# Patient Record
Sex: Female | Born: 1995 | Race: Black or African American | Hispanic: No | Marital: Single | Smoking: Current every day smoker
Health system: Southern US, Community
[De-identification: ages and names within clinical notes are randomized; demographics above are authoritative.]

## PROBLEM LIST (undated history)

## (undated) ENCOUNTER — Inpatient Hospital Stay (HOSPITAL_COMMUNITY): Payer: Self-pay

## (undated) DIAGNOSIS — G43909 Migraine, unspecified, not intractable, without status migrainosus: Secondary | ICD-10-CM

## (undated) DIAGNOSIS — J45909 Unspecified asthma, uncomplicated: Secondary | ICD-10-CM

## (undated) DIAGNOSIS — K589 Irritable bowel syndrome without diarrhea: Secondary | ICD-10-CM

## (undated) DIAGNOSIS — Z3490 Encounter for supervision of normal pregnancy, unspecified, unspecified trimester: Secondary | ICD-10-CM

## (undated) HISTORY — PX: WISDOM TOOTH EXTRACTION: SHX21

## (undated) HISTORY — PX: MOUTH SURGERY: SHX715

---
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
## 2013-04-17 ENCOUNTER — Ambulatory Visit: Payer: Self-pay | Admitting: Physician Assistant

## 2013-04-17 VITALS — BP 112/62 | HR 81 | Temp 98.1°F | Resp 18 | Ht 64.5 in | Wt 154.0 lb

## 2013-04-17 DIAGNOSIS — Z0289 Encounter for other administrative examinations: Secondary | ICD-10-CM

## 2013-04-17 NOTE — Progress Notes (Signed)
Subjective:    Patient ID: Ronnette Hila, female    DOB: 08/25/1996, 17 y.o.   MRN: [id number]  HPI   Ms. Heffner is a pleasant 17 yr old female here requesting a sports physical.  She plays volleyball at [employer] and was told she needed a physical.  She does not bring any paperwork with her to be completed.  She does not want a complete physical today.  She reports that she has a history of exercise induced asthma, treated with albuterol only.  Uses inhaler about twice per week.  Denies night time symptoms or symptoms at rest.    Denies other medication use.  Denies other medical problems.  Denies surgical history.  Reports hx of multiple ankle sprains while playing basketball.  Does report that she had "really bad chest pain" in 2012 that required an EKG.  She reports that this was normal.  She did see a cardiologist in [place] for this.   PCP is Dr. Orson Aloe here in [place] but she states she has not seen him in over 1 yr.  She has never had an further episodes of CP.  Denies syncope or presyncope with exercise or at rest.  She is not aware of any family history of heart problems or SCD.      Review of Systems  Constitutional: Negative.   HENT: Negative.   Respiratory: Negative.   Cardiovascular: Negative.   Gastrointestinal: Negative.   Musculoskeletal: Negative.   Skin: Negative.   Neurological: Negative.        Objective:   Physical Exam  Vitals reviewed. Constitutional: She is oriented to person, place, and time. She appears well-developed and well-nourished. No distress.  HENT:  Head: Normocephalic and atraumatic.  Right Ear: Tympanic membrane and ear canal normal.  Left Ear: Tympanic membrane and ear canal normal.  Mouth/Throat: Uvula is midline, oropharynx is clear and moist and mucous membranes are normal.  Eyes: Conjunctivae and EOM are normal. Pupils are equal, round, and reactive to light. No scleral icterus.  Neck: Normal range of motion. Neck supple.   Cardiovascular: Normal rate, regular rhythm and normal heart sounds.   Pulmonary/Chest: Effort normal and breath sounds normal. She has no wheezes. She has no rales.  Abdominal: Soft. Bowel sounds are normal. There is no tenderness.  Musculoskeletal: Normal range of motion.  Lymphadenopathy:    She has no cervical adenopathy.  Neurological: She is alert and oriented to person, place, and time. She has normal reflexes.  Skin: Skin is warm and dry.  Psychiatric: She has a normal mood and affect. Her behavior is normal.        Assessment & Plan:  Other general medical examination for administrative purposes   Ms. Speights is a 17 yr old female here for sports physical.  She declines CPE today.  She does not have paperwork to be completed.  She appears to be in good health.  She did have one episode of CP two years ago, reports the work up was negative.  She has not had any further episodes of this pain.  Exam is normal today.    I have printed a letter for pt to take to school stating that she was seen here today.  I am happy to fill out any required paperwork there may be based on this visit.  I discussed with pt that if there is ppw and it requires something that we have not done today, she may have to RTC for further exam, labs, etc.  Pt expresses understanding.

## 2013-04-17 NOTE — Patient Instructions (Addendum)

## 2013-05-03 ENCOUNTER — Emergency Department (HOSPITAL_COMMUNITY)
Admission: EM | Admit: 2013-05-03 | Discharge: 2013-05-04 | Disposition: A | Discharge status: Home / Self Care | Payer: Self-pay | Attending: Emergency Medicine | Admitting: Emergency Medicine

## 2013-05-03 ENCOUNTER — Encounter (HOSPITAL_COMMUNITY): Payer: Self-pay | Admitting: Emergency Medicine

## 2013-05-03 ENCOUNTER — Emergency Department (HOSPITAL_COMMUNITY): Payer: Self-pay

## 2013-05-03 DIAGNOSIS — Z79899 Other long term (current) drug therapy: Secondary | ICD-10-CM | POA: Insufficient documentation

## 2013-05-03 DIAGNOSIS — Y9239 Other specified sports and athletic area as the place of occurrence of the external cause: Secondary | ICD-10-CM | POA: Insufficient documentation

## 2013-05-03 DIAGNOSIS — Y9368 Activity, volleyball (beach) (court): Secondary | ICD-10-CM | POA: Insufficient documentation

## 2013-05-03 DIAGNOSIS — R32 Unspecified urinary incontinence: Secondary | ICD-10-CM | POA: Insufficient documentation

## 2013-05-03 DIAGNOSIS — S139XXA Sprain of joints and ligaments of unspecified parts of neck, initial encounter: Secondary | ICD-10-CM | POA: Insufficient documentation

## 2013-05-03 DIAGNOSIS — S060X0A Concussion without loss of consciousness, initial encounter: Secondary | ICD-10-CM | POA: Insufficient documentation

## 2013-05-03 DIAGNOSIS — Z3202 Encounter for pregnancy test, result negative: Secondary | ICD-10-CM | POA: Insufficient documentation

## 2013-05-03 DIAGNOSIS — R42 Dizziness and giddiness: Secondary | ICD-10-CM | POA: Insufficient documentation

## 2013-05-03 DIAGNOSIS — J45909 Unspecified asthma, uncomplicated: Secondary | ICD-10-CM | POA: Insufficient documentation

## 2013-05-03 DIAGNOSIS — W219XXA Striking against or struck by unspecified sports equipment, initial encounter: Secondary | ICD-10-CM | POA: Insufficient documentation

## 2013-05-03 MED ORDER — METOCLOPRAMIDE HCL 5 MG/ML IJ SOLN
10.0000 mg | Freq: Once | INTRAMUSCULAR | Status: AC
  Administered 2013-05-03: 10 mg via INTRAVENOUS
  Filled 2013-05-03: qty 2

## 2013-05-03 MED ORDER — DEXAMETHASONE SODIUM PHOSPHATE 10 MG/ML IJ SOLN
10.0000 mg | Freq: Once | INTRAMUSCULAR | Status: AC
  Administered 2013-05-03: 10 mg via INTRAVENOUS
  Filled 2013-05-03: qty 1

## 2013-05-03 MED ORDER — DIPHENHYDRAMINE HCL 50 MG/ML IJ SOLN
25.0000 mg | Freq: Once | INTRAMUSCULAR | Status: AC
  Administered 2013-05-03: 25 mg via INTRAVENOUS
  Filled 2013-05-03: qty 1

## 2013-05-03 NOTE — ED Notes (Signed)
No neuro deficits noted. Denies photophobia. Denies dizziness

## 2013-05-03 NOTE — ED Provider Notes (Signed)
CSN: [id number]     Arrival date & time 05/03/13  1956 History   First MD Initiated Contact with Patient 05/03/13 2146     Chief Complaint  Patient presents with  . Head Injury   (Consider location/radiation/quality/duration/timing/severity/associated sxs/prior Treatment) HPI  17 year old female with a history of exercise-induced asthma and presents with headache following hitting head during volleyball yesterday. Patient reports she was playing volleyball yesterday when she was elbowed in the back of the head, and the volleyball hit her in the side of the head, and fell forwards at the top of her head hitting her friend in the knee. Denies any loss of consciousness, nausea, vomiting, blurred vision. Does report she had continuing headache since this time as well as neck pain. Headache is rated 8/10. She also has ringing in her left ear. Denies any numbness, tingling, weakness.  No other medical problems and does not take blood thinners. No fevers.   Past Medical History  Diagnosis Date  . Asthma    History reviewed. No pertinent past surgical history. Family History  Problem Relation Age of Onset  . Hypertension Mother    History  Substance Use Topics  . Smoking status: Never Smoker   . Smokeless tobacco: Not on file  . Alcohol Use: Yes     Comment: occasional   OB History   Grav Para Term Preterm Abortions TAB SAB Ect Mult Living                 Review of Systems  Constitutional: Negative for fever.  HENT: Positive for tinnitus. Negative for sore throat and neck pain.   Eyes: Negative for visual disturbance.  Respiratory: Negative for cough and shortness of breath.   Cardiovascular: Negative for chest pain.  Gastrointestinal: Negative for nausea, vomiting and abdominal pain.  Genitourinary: Negative for difficulty urinating.  Musculoskeletal: Negative for back pain.  Skin: Negative for rash.  Neurological: Positive for light-headedness and headaches. Negative for  dizziness, tremors, syncope, facial asymmetry, speech difficulty, weakness and numbness.    Allergies  Review of patient's allergies indicates no known allergies.  Home Medications   Current Outpatient Rx  Name  Route  Sig  Dispense  Refill  . albuterol (PROVENTIL HFA;VENTOLIN HFA) 108 (90 BASE) MCG/ACT inhaler   Inhalation   Inhale 2 puffs into the lungs every 6 (six) hours as needed for wheezing or shortness of breath.           BP 113/69  Pulse 59  Temp(Src) 98.6 F (37 C) (Oral)  Resp 18  SpO2 100%  LMP 04/13/2013 Physical Exam  Nursing note and vitals reviewed. Constitutional: She is oriented to person, place, and time. She appears well-developed and well-nourished. No distress.  HENT:  Head: Normocephalic and atraumatic.  Mouth/Throat: Oropharynx is clear and moist. No oropharyngeal exudate.  Eyes: Conjunctivae and EOM are normal. Pupils are equal, round, and reactive to light.  Neck: Normal range of motion. Spinous process tenderness present. Rigidity present.  Cardiovascular: Normal rate, regular rhythm, normal heart sounds and intact distal pulses.  Exam reveals no gallop and no friction rub.   No murmur heard. Pulmonary/Chest: Effort normal and breath sounds normal. No respiratory distress. She has no wheezes. She has no rales.  Abdominal: Soft. She exhibits no distension. There is no tenderness. There is no guarding.  Musculoskeletal: She exhibits no edema and no tenderness.  Neurological: She is alert and oriented to person, place, and time. She has normal strength. She displays normal reflexes.  No cranial nerve deficit or sensory deficit. Coordination and gait normal. GCS eye subscore is 4. GCS verbal subscore is 5. GCS motor subscore is 6.  Skin: Skin is warm and dry. No rash noted. She is not diaphoretic. No erythema.    ED Course  Procedures (including critical care time) Labs Review Labs Reviewed  POCT PREGNANCY, URINE   Imaging Review Ct Head Wo  Contrast  05/03/2013   *RADIOLOGY REPORT*  Clinical Data:  Hit in back of head with elbow, now with left-sided tinnitus, headache and increased sleepiness  CT HEAD WITHOUT CONTRAST CT CERVICAL SPINE WITHOUT CONTRAST  Technique:  Multidetector CT imaging of the head and cervical spine was performed following the standard protocol without intravenous contrast.  Multiplanar CT image reconstructions of the cervical spine were also generated.  Comparison:   None  CT HEAD  Findings:  Wallace Cullens white differentiation is maintained.  No CT evidence of acute large territory infarct.  No intraparenchymal or extra-axial mass or hemorrhage.  Normal size and configuration of the ventricles and basal cisterns.  No midline shift.  Limited visualization of the paranasal sinuses and mastoid air cells are normal.  The regional soft tissues are normal.  No displaced calvarial fracture.  IMPRESSION: Negative noncontrast head CT  -------------------------------------------------------  CT CERVICAL SPINE  Findings:  C1 to the superior endplate of T3 is imaged.  Normal alignment of the cervical spine.  No anterolisthesis or retrolisthesis.  The bilateral facets are normally aligned.  The dens is normally positioned and the lateral masses of C1.  Normal atlantodental atlantoaxial articulations.  No fracture or static subluxation of the cervical spine.  Cervical vertebral body heights are preserved.  Prevertebral soft tissues are normal.  Intervertebral disc spaces are preserved.  The regional soft tissues are normal.  Normal noncontrast appearance of the thyroid gland.  Limited visualization of the lung   apices are normal.  IMPRESSION: Normal cervical spine CT.   Original Report Authenticated By: Tacey Ruiz, MD   Ct Cervical Spine Wo Contrast  05/03/2013   *RADIOLOGY REPORT*  Clinical Data:  Hit in back of head with elbow, now with left-sided tinnitus, headache and increased sleepiness  CT HEAD WITHOUT CONTRAST CT CERVICAL SPINE WITHOUT  CONTRAST  Technique:  Multidetector CT imaging of the head and cervical spine was performed following the standard protocol without intravenous contrast.  Multiplanar CT image reconstructions of the cervical spine were also generated.  Comparison:   None  CT HEAD  Findings:  Wallace Cullens white differentiation is maintained.  No CT evidence of acute large territory infarct.  No intraparenchymal or extra-axial mass or hemorrhage.  Normal size and configuration of the ventricles and basal cisterns.  No midline shift.  Limited visualization of the paranasal sinuses and mastoid air cells are normal.  The regional soft tissues are normal.  No displaced calvarial fracture.  IMPRESSION: Negative noncontrast head CT  -------------------------------------------------------  CT CERVICAL SPINE  Findings:  C1 to the superior endplate of T3 is imaged.  Normal alignment of the cervical spine.  No anterolisthesis or retrolisthesis.  The bilateral facets are normally aligned.  The dens is normally positioned and the lateral masses of C1.  Normal atlantodental atlantoaxial articulations.  No fracture or static subluxation of the cervical spine.  Cervical vertebral body heights are preserved.  Prevertebral soft tissues are normal.  Intervertebral disc spaces are preserved.  The regional soft tissues are normal.  Normal noncontrast appearance of the thyroid gland.  Limited visualization of the  lung   apices are normal.  IMPRESSION: Normal cervical spine CT.   Original Report Authenticated By: Tacey Ruiz, MD    MDM   1. Concussion, without loss of consciousness, initial encounter   2. Cervical sprain, initial encounter    17 year old female with a history of exercise-induced asthma and presents with headache and neck pain following hitting head during volleyball yesterday.  Patient with midline neck tenderness on exam, and cannot be cleared by NEXUS criteria so CT C-spine was ordered and CT head was also obtained given severity of  headache.  CT head and C-spine showed no acute abnormalities. Discussed results with patient and family. Patient with continuing midline neck tenderness on exam, and recommended to continue wearing her cervical collar until she can be evaluated by her primary care physician at a later time given the possibility of ligamentous injury with both direct axial loading and whiplash mechanism. Patient likely with concussion resulting in headache and continued draining of the ears the patient was given a headache cocktail in the emergency department with improvement of symptoms. She was recommended to take Tylenol and ibuprofen at home and follow up with her primary care physician in one week.  Patient was discharged in stable condition with understanding of reasons to return.   Rhae Lerner, MD 05/04/13 [phone]

## 2013-05-03 NOTE — ED Notes (Signed)
PT. HIT AT BACK OF HEAD WITH AN ELBOW WHILE PLAYING VOLLEYBALL LAST NIGHT , NO LOC / AMBULATORY , ALERT AND ORIENTED , REPORTS LIGHTHEADEDNESS , LEFT EAR " RINGING " , SLEEPY AND SLIGHT HEADACHE.

## 2013-05-03 NOTE — ED Notes (Signed)
Patient transported to CT 

## 2013-05-04 NOTE — ED Provider Notes (Signed)
I saw and evaluated the patient, reviewed the resident's note and I agree with the findings and plan.   Scans benign for head injury and neck injury, but given the whiplash and continued neck tenderness with limited ROM she is unable to be cleared from collar. Normal neuro exam, stable for outpatient f/u for further c-spine imaging.   Audree Camel, MD 05/04/13 [phone]

## 2013-05-11 ENCOUNTER — Encounter (HOSPITAL_COMMUNITY): Payer: Self-pay | Admitting: *Deleted

## 2013-05-11 ENCOUNTER — Emergency Department (HOSPITAL_COMMUNITY)
Admission: EM | Admit: 2013-05-11 | Discharge: 2013-05-11 | Disposition: A | Discharge status: Home / Self Care | Payer: Medicaid Other | Attending: Emergency Medicine | Admitting: Emergency Medicine

## 2013-05-11 DIAGNOSIS — Z79899 Other long term (current) drug therapy: Secondary | ICD-10-CM | POA: Insufficient documentation

## 2013-05-11 DIAGNOSIS — Z008 Encounter for other general examination: Secondary | ICD-10-CM | POA: Insufficient documentation

## 2013-05-11 DIAGNOSIS — Z0000 Encounter for general adult medical examination without abnormal findings: Secondary | ICD-10-CM

## 2013-05-11 DIAGNOSIS — J45909 Unspecified asthma, uncomplicated: Secondary | ICD-10-CM | POA: Insufficient documentation

## 2013-05-11 NOTE — ED Notes (Signed)
Pt was here on 8/27 and diagnosed with cervical sprain and concussion. Has school note that says no sports till cleared by md and pt unable to follow up with pediatrician due to age and pt has volleyball practice today and wants note to be cleared for sports. No complaints, no distress noted.

## 2013-05-11 NOTE — ED Provider Notes (Signed)
Medical screening examination/treatment/procedure(s) were performed by non-physician practitioner and as supervising physician I was immediately available for consultation/collaboration.   Dagmar Hait, MD 05/11/13 1131

## 2013-05-11 NOTE — ED Provider Notes (Signed)
CSN: [id number]     Arrival date & time 05/11/13  4098 History   First MD Initiated Contact with Patient 05/11/13 1011     Chief Complaint  Patient presents with  . Follow-up   (Consider location/radiation/quality/duration/timing/severity/associated sxs/prior Treatment) HPI Patient presents emergency department for recheck of concussion.  Patient, states she was diagnosed here in the emergency department with mild concussion after taking an elbow to the back of her head.  Patient, states she has had no symptoms for the last 7 days.  She denies headache, blurred vision, weakness, numbness, dizziness, syncope, shortness of breath, neck pain, or seizure activity.  Patient, states she needs clearance to resume her volleyball activities  Past Medical History  Diagnosis Date  . Asthma    History reviewed. No pertinent past surgical history. Family History  Problem Relation Age of Onset  . Hypertension Mother    History  Substance Use Topics  . Smoking status: Never Smoker   . Smokeless tobacco: Not on file  . Alcohol Use: Yes     Comment: occasional   OB History   Grav Para Term Preterm Abortions TAB SAB Ect Mult Living                 Review of Systems All other systems negative except as documented in the HPI. All pertinent positives and negatives as reviewed in the HPI. Allergies  Review of patient's allergies indicates no known allergies.  Home Medications   Current Outpatient Rx  Name  Route  Sig  Dispense  Refill  . albuterol (PROVENTIL HFA;VENTOLIN HFA) 108 (90 BASE) MCG/ACT inhaler   Inhalation   Inhale 2 puffs into the lungs every 6 (six) hours as needed for wheezing or shortness of breath.           BP 113/77  Pulse 68  Temp(Src) 98.2 F (36.8 C) (Oral)  Resp 18  SpO2 99%  LMP 04/21/2013 Physical Exam  Nursing note and vitals reviewed. Constitutional: She is oriented to person, place, and time. She appears well-developed and well-nourished. No distress.   HENT:  Head: Normocephalic and atraumatic.  Mouth/Throat: Oropharynx is clear and moist.  Eyes: EOM are normal. Pupils are equal, round, and reactive to light.  Cardiovascular: Normal rate and regular rhythm.   Pulmonary/Chest: Effort normal and breath sounds normal.  Neurological: She is alert and oriented to person, place, and time. She exhibits normal muscle tone. Coordination normal.  Skin: Skin is warm and dry. No rash noted.    ED Course  Procedures (including critical care time) Since the patient is symptom-free and having.  No ill effects from the mild head injury.  I feel she is clear to return to activity.  She is advised that if any of these symptoms, return she'll need to be further evaluated on an outpatient basis  MDM      Carlyle Dolly, PA-C 05/11/13 1014

## 2013-10-10 ENCOUNTER — Encounter (HOSPITAL_COMMUNITY): Payer: Self-pay | Admitting: Emergency Medicine

## 2013-10-10 ENCOUNTER — Emergency Department (HOSPITAL_COMMUNITY)
Admission: EM | Admit: 2013-10-10 | Discharge: 2013-10-10 | Disposition: A | Discharge status: Home / Self Care | Payer: Medicaid Other | Attending: Emergency Medicine | Admitting: Emergency Medicine

## 2013-10-10 DIAGNOSIS — R112 Nausea with vomiting, unspecified: Secondary | ICD-10-CM | POA: Insufficient documentation

## 2013-10-10 DIAGNOSIS — Z79899 Other long term (current) drug therapy: Secondary | ICD-10-CM | POA: Insufficient documentation

## 2013-10-10 DIAGNOSIS — J020 Streptococcal pharyngitis: Secondary | ICD-10-CM | POA: Insufficient documentation

## 2013-10-10 DIAGNOSIS — J45909 Unspecified asthma, uncomplicated: Secondary | ICD-10-CM | POA: Insufficient documentation

## 2013-10-10 MED ORDER — PENICILLIN V POTASSIUM 500 MG PO TABS: 500.0000 mg | ORAL_TABLET | Freq: Four times a day (QID) | ORAL | Status: DC

## 2013-10-10 MED ORDER — DEXAMETHASONE SODIUM PHOSPHATE 10 MG/ML IJ SOLN
10.0000 mg | Freq: Once | INTRAMUSCULAR | Status: AC
  Administered 2013-10-10: 10 mg via INTRAMUSCULAR
  Filled 2013-10-10: qty 1

## 2013-10-10 NOTE — ED Provider Notes (Signed)
Medical screening examination/treatment/procedure(s) were performed by non-physician practitioner and as supervising physician I was immediately available for consultation/collaboration.    Celene KrasJon R Georgeann Brinkman, MD 10/10/13 [phone]

## 2013-10-10 NOTE — ED Notes (Signed)
Pt reports a sore throat and swelling and tenderness on l/side of throat x 2 days

## 2013-10-10 NOTE — Discharge Instructions (Signed)
Strep Throat  Strep throat is an infection of the throat. It is caused by a germ. Strep throat spreads from person to person by coughing, sneezing, or close contact.  HOME CARE  · Rinse your mouth (gargle) with warm salt water (1 teaspoon salt in 1 cup of water). Do this 3 to 4 times per day or as needed for comfort.  · Family members with a sore throat or fever should see a doctor.  · Make sure everyone in your house washes their hands well.  · Do not share food, drinking cups, or personal items.  · Eat soft foods until your sore throat gets better.  · Drink enough water and fluids to keep your pee (urine) clear or pale yellow.  · Rest.  · Stay home from school, daycare, or work until you have taken medicine for 24 hours.  · Only take medicine as told by your doctor.  · Take your medicine as told. Finish it even if you start to feel better.  GET HELP RIGHT AWAY IF:   · You have new problems, such as throwing up (vomiting) or bad headaches.  · You have a stiff or painful neck, chest pain, trouble breathing, or trouble swallowing.  · You have very bad throat pain, drooling, or changes in your voice.  · Your neck puffs up (swells) or gets red and tender.  · You have a fever.  · You are very tired, your mouth is dry, or you are peeing less than normal.  · You cannot wake up completely.  · You get a rash, cough, or earache.  · You have green, yellow-brown, or bloody spit.  · Your pain does not get better with medicine.  MAKE SURE YOU:   · Understand these instructions.  · Will watch your condition.  · Will get help right away if you are not doing well or get worse.  Document Released: 02/10/2008 Document Revised: 11/16/2011 Document Reviewed: 10/23/2010  Exit[employer]® Patient Information ©2014 [employer].

## 2013-10-10 NOTE — ED Provider Notes (Signed)
CSN: [id number]     Arrival date & time 10/10/13  1714 History  This chart was scribed for non-physician practitioner, Junious Silk, PA-C working with Celene Kras, MD by Greggory Stallion, ED scribe. This patient was seen in room WTR5/WTR5 and the patient's care was started at 5:42 PM.   Chief Complaint  Patient presents with  . Sore Throat    2 day hx of sore throat   The history is provided by the patient. No language interpreter was used.   HPI Comments: Emily Callahan is a 18 y.o. female who presents to the Emergency Department complaining of gradual onset, worsening sore throat and congestion that started 2 days ago. Swallowing worsens the pain. She has taken NyQuil and tylenol with no relief. Pt states she had an episode of emesis this morning. Denies fever, abdominal pain, headache, cough, ear pain.    Past Medical History  Diagnosis Date  . Asthma    No past surgical history on file. Family History  Problem Relation Age of Onset  . Hypertension Mother    History  Substance Use Topics  . Smoking status: Never Smoker   . Smokeless tobacco: Not on file  . Alcohol Use: Yes     Comment: occasional   OB History   Grav Para Term Preterm Abortions TAB SAB Ect Mult Living                 Review of Systems  Constitutional: Negative for fever.  HENT: Positive for congestion and sore throat. Negative for ear pain.   Respiratory: Negative for cough.   Gastrointestinal: Positive for nausea and vomiting. Negative for abdominal pain.  Neurological: Negative for headaches.  All other systems reviewed and are negative.    Allergies  Review of patient's allergies indicates no known allergies.  Home Medications   Current Outpatient Rx  Name  Route  Sig  Dispense  Refill  . albuterol (PROVENTIL HFA;VENTOLIN HFA) 108 (90 BASE) MCG/ACT inhaler   Inhalation   Inhale 2 puffs into the lungs every 6 (six) hours as needed for wheezing or shortness of breath.           BP 112/74   Pulse 95  Temp(Src) 98.7 F (37.1 C) (Oral)  Ht 5\' 4"  (1.626 m)  Wt 155 lb (70.308 kg)  BMI 26.59 kg/m2  SpO2 97%  LMP 10/08/2013  Physical Exam  Nursing note and vitals reviewed. Constitutional: She is oriented to person, place, and time. She appears well-developed and well-nourished. No distress.  HENT:  Head: Normocephalic and atraumatic.  Right Ear: External ear normal.  Left Ear: External ear normal.  Nose: Nose normal.  Mouth/Throat: Oropharynx is clear and moist.  Petechiae on soft palate. Enlarged tonsils bilaterally with exudate. No trismus.   Eyes: Conjunctivae are normal.  Neck: Normal range of motion.  Cardiovascular: Normal rate, regular rhythm and normal heart sounds.   Pulmonary/Chest: Effort normal and breath sounds normal. No stridor. No respiratory distress. She has no wheezes. She has no rales.  Abdominal: Soft. She exhibits no distension.  Musculoskeletal: Normal range of motion.  Lymphadenopathy:    She has cervical adenopathy.  Neurological: She is alert and oriented to person, place, and time. She has normal strength.  Skin: Skin is warm and dry. She is not diaphoretic. No erythema.  Psychiatric: She has a normal mood and affect. Her behavior is normal.    ED Course  Procedures (including critical care time)  DIAGNOSTIC STUDIES: Oxygen Saturation is  97% on RA, normal by my interpretation.    COORDINATION OF CARE: 5:44 PM-Discussed treatment plan which includes an antibiotic and a steroid with pt at bedside and pt agreed to plan.   Labs Review Labs Reviewed - No data to display Imaging Review No results found.  EKG Interpretation   None       MDM   1. Strep pharyngitis    Pt with tonsillar exudate, cervical lymphadenopathy, & dysphagia; diagnosis of strep. Treated in the Ed with steroids. Will d/c home with Pen VK  Pt appears mildly dehydrated, discussed importance of water rehydration. Presentation non concerning for PTA or infxn spread  to soft tissue. No trismus or uvula deviation. Specific return precautions discussed. Pt able to drink water in ED without difficulty with intact air way. Recommended PCP follow up.   I personally performed the services described in this documentation, which was scribed in my presence. The recorded information has been reviewed and is accurate.    Mora BellmanHannah S Genola Yuille, PA-C 10/10/13 1755

## 2013-12-04 ENCOUNTER — Emergency Department (HOSPITAL_COMMUNITY)
Admission: EM | Admit: 2013-12-04 | Discharge: 2013-12-04 | Disposition: A | Discharge status: Home / Self Care | Payer: Medicaid Other | Attending: Emergency Medicine | Admitting: Emergency Medicine

## 2013-12-04 ENCOUNTER — Encounter (HOSPITAL_COMMUNITY): Payer: Self-pay | Admitting: Emergency Medicine

## 2013-12-04 ENCOUNTER — Emergency Department (HOSPITAL_COMMUNITY): Payer: Medicaid Other

## 2013-12-04 DIAGNOSIS — R630 Anorexia: Secondary | ICD-10-CM | POA: Insufficient documentation

## 2013-12-04 DIAGNOSIS — J45909 Unspecified asthma, uncomplicated: Secondary | ICD-10-CM | POA: Insufficient documentation

## 2013-12-04 DIAGNOSIS — N939 Abnormal uterine and vaginal bleeding, unspecified: Secondary | ICD-10-CM

## 2013-12-04 DIAGNOSIS — N898 Other specified noninflammatory disorders of vagina: Secondary | ICD-10-CM | POA: Insufficient documentation

## 2013-12-04 DIAGNOSIS — R42 Dizziness and giddiness: Secondary | ICD-10-CM | POA: Insufficient documentation

## 2013-12-04 DIAGNOSIS — R634 Abnormal weight loss: Secondary | ICD-10-CM | POA: Insufficient documentation

## 2013-12-04 DIAGNOSIS — Z3202 Encounter for pregnancy test, result negative: Secondary | ICD-10-CM | POA: Insufficient documentation

## 2013-12-04 DIAGNOSIS — R197 Diarrhea, unspecified: Secondary | ICD-10-CM | POA: Insufficient documentation

## 2013-12-04 DIAGNOSIS — Z79899 Other long term (current) drug therapy: Secondary | ICD-10-CM | POA: Insufficient documentation

## 2013-12-04 LAB — I-STAT CHEM 8, ED
BUN: <3 mg/dL — ABNORMAL LOW (ref 6–23)
Calcium, Ion: 1.18 mmol/L (ref 1.12–1.23)
Chloride: 102 mEq/L (ref 96–112)
Creatinine, Ser: 0.8 mg/dL (ref 0.50–1.10)
Glucose, Bld: 95 mg/dL (ref 70–99)
HCT: 42 % (ref 36.0–46.0)
Hemoglobin: 14.3 g/dL (ref 12.0–15.0)
Potassium: 4.3 mEq/L (ref 3.7–5.3)
Sodium: 138 mEq/L (ref 137–147)
TCO2: 25 mmol/L (ref 0–100)

## 2013-12-04 LAB — URINALYSIS, ROUTINE W REFLEX MICROSCOPIC
Bilirubin Urine: NEGATIVE
Glucose, UA: NEGATIVE mg/dL
Ketones, ur: NEGATIVE mg/dL
Leukocytes, UA: NEGATIVE
Nitrite: NEGATIVE
Protein, ur: NEGATIVE mg/dL
Specific Gravity, Urine: 1.016 (ref 1.005–1.030)
Urobilinogen, UA: 0.2 mg/dL (ref 0.0–1.0)
pH: 8 (ref 5.0–8.0)

## 2013-12-04 LAB — URINE MICROSCOPIC-ADD ON

## 2013-12-04 LAB — WET PREP, GENITAL
Trich, Wet Prep: NONE SEEN
Yeast Wet Prep HPF POC: NONE SEEN

## 2013-12-04 LAB — PREGNANCY, URINE: Preg Test, Ur: NEGATIVE

## 2013-12-04 MED ORDER — IBUPROFEN 800 MG PO TABS: 800.0000 mg | ORAL_TABLET | Freq: Three times a day (TID) | ORAL | Status: DC | PRN

## 2013-12-04 MED ORDER — HYDROCODONE-ACETAMINOPHEN 5-325 MG PO TABS
1.0000 | ORAL_TABLET | Freq: Four times a day (QID) | ORAL | Status: DC | PRN
Start: 2013-12-04 — End: 2014-03-23

## 2013-12-04 NOTE — ED Provider Notes (Signed)
CSN: [id number]     Arrival date & time 12/04/13  1239 History   First MD Initiated Contact with Patient 12/04/13 1324     Chief Complaint  Patient presents with  . Vaginal Bleeding  . Cough     (Consider location/radiation/quality/duration/timing/severity/associated sxs/prior Treatment) HPI:  [name] is a 18 year old woman with past medical history of asthma who presents to the Emergency Department today with chief complaint of vaginal bleeding.  She states that she started menstruating at the age of 18 and has been on a fairly regular schedule of 3 days of menses around the end of the month.  She reports that when this vaginal bleeding began 12 days ago she believed it was just her menses come early, because she had her usual associated symptoms of diarrhea and cramping pelvic and low back pain.  However, the bleeding, diarrhea, and pain have continued for 12 days.  She states that she has gone through 34 super jumbo tampons, and that it feels like the bright red blood is "just pouring out."  She endorses associated anorexia, 3lb weight loss, and episodes of dizziness; denies dysuria.  She is sexually active and does not use any method of birth control.  She and her boyfriend deny genital discharge and history of STIs.  She also notes that she has had a cough for the past two days, and that this morning she coughed up a small amount of clear sputum tinged with dark red blood.  She denies fever, sore throat, rhinorrhea, sinus pain, chest pain, and shortness of breath.   Past Medical History  Diagnosis Date  . Asthma    History reviewed. No pertinent past surgical history. Family History  Problem Relation Age of Onset  . Hypertension Mother    History  Substance Use Topics  . Smoking status: Never Smoker   . Smokeless tobacco: Not on file  . Alcohol Use: Yes     Comment: occasional   OB History   Grav Para Term Preterm Abortions TAB SAB Ect Mult Living                 Review of  Systems All other systems negative except as documented in the HPI. All pertinent positives and negatives as reviewed in the HPI.   Allergies  Review of patient's allergies indicates no known allergies.  Home Medications   Current Outpatient Rx  Name  Route  Sig  Dispense  Refill  . albuterol (PROVENTIL HFA;VENTOLIN HFA) 108 (90 BASE) MCG/ACT inhaler   Inhalation   Inhale 2 puffs into the lungs every 6 (six) hours as needed for wheezing or shortness of breath.         . Ibuprofen-Diphenhydramine HCl (ADVIL PM) 200-25 MG CAPS   Oral   Take 2 tablets by mouth at bedtime as needed.          BP 122/73  Pulse 70  Temp(Src) 98.6 F (37 C) (Oral)  Resp 20  SpO2 100% Physical Exam  Nursing note and vitals reviewed. Constitutional: She is oriented to person, place, and time. She appears well-developed and well-nourished. No distress.  HENT:  Head: Normocephalic and atraumatic.  Mouth/Throat: Oropharynx is clear and moist.  Eyes: Pupils are equal, round, and reactive to light.  Neck: Normal range of motion. Neck supple.  Cardiovascular: Normal rate, regular rhythm and normal heart sounds.   Pulmonary/Chest: Effort normal and breath sounds normal. No respiratory distress.  Abdominal: Soft. Normal appearance and bowel sounds  are normal. She exhibits no distension. There is tenderness. There is no rebound and no CVA tenderness.    Tender to palpation of LLQ and suprapubic region  Genitourinary: Vagina normal.  Blood present in vaginal vault; no other discharge or lesions noted; no cervical motion or adnexal tenderness  Neurological: She is alert and oriented to person, place, and time. Coordination normal.  Skin: Skin is warm and dry. No rash noted. No erythema.    ED Course  Procedures (including critical care time) Labs Review Labs Reviewed  WET PREP, GENITAL - Abnormal; Notable for the following:    Clue Cells Wet Prep HPF POC FEW (*)    WBC, Wet Prep HPF POC FEW (*)     All other components within normal limits  URINALYSIS, ROUTINE W REFLEX MICROSCOPIC - Abnormal; Notable for the following:    Hgb urine dipstick MODERATE (*)    All other components within normal limits  I-STAT CHEM 8, ED - Abnormal; Notable for the following:    BUN <3 (*)    All other components within normal limits  GC/CHLAMYDIA PROBE AMP  PREGNANCY, URINE  URINE MICROSCOPIC-ADD ON     Patient needs to follow with GYN.  Patient, agrees to the, plan.  She's been stable here in the emergency department.  All questions were answered.  She has had chronic back pain so that feature is not new to her condition.  Advised patient to return here for any worsening in her condition  Carlyle Dolly, PA-C 12/04/13 1525

## 2013-12-04 NOTE — Progress Notes (Signed)
[facility] provided pt with a list of primary care resources, adding [facility] to help patient establish primary care.

## 2013-12-04 NOTE — Discharge Instructions (Signed)
Followup with the clinic provided as soon as possible for an appointment, return here as needed.

## 2013-12-04 NOTE — ED Provider Notes (Signed)
Medical screening examination/treatment/procedure(s) were conducted as a shared visit with non-physician practitioner(s) and myself.  I personally evaluated the patient during the encounter.   EKG Interpretation None      Pt c/o vaginal bleeding and intermittent lower abd cramping. abd soft nt. Pelvic exam by pa. Labs.   Suzi RootsKevin E Eladio Dentremont, MD 12/04/13 [phone]

## 2013-12-04 NOTE — ED Notes (Signed)
Pt c/o vaginal bleeding, lower abdominal pain, and lower back pain x 12 days and "coughing up blood" x 1 day.  Pain score 5/10.  Pt reports that normally her period is 3-4 days.  Denies birth control.

## 2013-12-05 LAB — GC/CHLAMYDIA PROBE AMP
CT Probe RNA: NEGATIVE
GC Probe RNA: NEGATIVE

## 2013-12-25 ENCOUNTER — Encounter: Payer: Self-pay | Admitting: Obstetrics & Gynecology

## 2013-12-25 ENCOUNTER — Ambulatory Visit (INDEPENDENT_AMBULATORY_CARE_PROVIDER_SITE_OTHER): Payer: Self-pay | Admitting: Obstetrics & Gynecology

## 2013-12-25 VITALS — BP 111/69 | HR 79 | Temp 97.8°F | Ht 64.5 in | Wt 155.4 lb

## 2013-12-25 DIAGNOSIS — N938 Other specified abnormal uterine and vaginal bleeding: Secondary | ICD-10-CM

## 2013-12-25 DIAGNOSIS — N949 Unspecified condition associated with female genital organs and menstrual cycle: Secondary | ICD-10-CM

## 2013-12-25 NOTE — Patient Instructions (Signed)
Retroverted uterus

## 2013-12-26 NOTE — Progress Notes (Signed)
   CLINIC ENCOUNTER NOTE  History:  18 y.o. G0P0000 here today for evaluation of one episode of abnormal menstrual period.  She was seen in the ED on 12/04/13 with significant vaginal bleeding; this was a prolonged menstrual period that lasted for almost two weeks (normal is usually three days). Never had an abnormal menstrual period since menarche at age 18. Bleeding was associated with moderate lower abdominal pain.  [facility], GC/Chlam and wet prep were also negative.  She was noted to be hemodynamically stable, no heavy bleeding noted.  The plan was made for her to follow up here.  Today, she denies any current symptoms. Just wants to "make sure everything was okay"  The following portions of the patient's history were reviewed and updated as appropriate: allergies, current medications, past family history, past medical history, past social history, past surgical history and problem list.  Has received Gardasil series.  Review of Systems:  A comprehensive review of systems was negative.  Objective:  Physical Exam BP 111/69  Pulse 79  Temp(Src) 97.8 F (36.6 C)  Ht 5' 4.5" (1.638 m)  Wt 155 lb 6.4 oz (70.489 kg)  BMI 26.27 kg/m2  LMP 12/02/2013 Gen: NAD Abd: Soft, nontender and nondistended Pelvic/Bimanual: Normal appearing external genitalia.  Normal discharge.  Small uterus, normal adnexa, no other palpable masses, no uterine or adnexal tenderness  Assessment & Plan:  Patient with one episode of prolonged bleeding.  This could be a result of a variety of factors; intensive evaluation is not indicated unless it becomes a pattern in the absence of any current symptoms and the patient has a normal examination.  If it occurs again, may need lab studies and pelvic ultrasound for further evaluation .  Patient reassured by normal exam. Bleeding precautions reviewed.     Jaynie CollinsUGONNA  Jeromie Gainor, MD, FACOG Attending Obstetrician & Gynecologist Faculty Practice,  [facility]

## 2014-03-23 ENCOUNTER — Encounter (HOSPITAL_COMMUNITY): Payer: Self-pay | Admitting: Emergency Medicine

## 2014-03-23 ENCOUNTER — Emergency Department (HOSPITAL_COMMUNITY): Payer: Self-pay

## 2014-03-23 ENCOUNTER — Emergency Department (HOSPITAL_COMMUNITY)
Admission: EM | Admit: 2014-03-23 | Discharge: 2014-03-23 | Disposition: A | Discharge status: Home / Self Care | Payer: Self-pay | Attending: Emergency Medicine | Admitting: Emergency Medicine

## 2014-03-23 DIAGNOSIS — Z79899 Other long term (current) drug therapy: Secondary | ICD-10-CM | POA: Insufficient documentation

## 2014-03-23 DIAGNOSIS — Y939 Activity, unspecified: Secondary | ICD-10-CM | POA: Insufficient documentation

## 2014-03-23 DIAGNOSIS — Y929 Unspecified place or not applicable: Secondary | ICD-10-CM | POA: Insufficient documentation

## 2014-03-23 DIAGNOSIS — N390 Urinary tract infection, site not specified: Secondary | ICD-10-CM | POA: Insufficient documentation

## 2014-03-23 DIAGNOSIS — IMO0002 Reserved for concepts with insufficient information to code with codable children: Secondary | ICD-10-CM | POA: Insufficient documentation

## 2014-03-23 DIAGNOSIS — Z3202 Encounter for pregnancy test, result negative: Secondary | ICD-10-CM | POA: Insufficient documentation

## 2014-03-23 DIAGNOSIS — J45909 Unspecified asthma, uncomplicated: Secondary | ICD-10-CM | POA: Insufficient documentation

## 2014-03-23 DIAGNOSIS — Z23 Encounter for immunization: Secondary | ICD-10-CM | POA: Insufficient documentation

## 2014-03-23 DIAGNOSIS — R404 Transient alteration of awareness: Secondary | ICD-10-CM | POA: Insufficient documentation

## 2014-03-23 LAB — URINE MICROSCOPIC-ADD ON

## 2014-03-23 LAB — URINALYSIS, ROUTINE W REFLEX MICROSCOPIC
BILIRUBIN URINE: NEGATIVE
GLUCOSE, UA: NEGATIVE mg/dL
HGB URINE DIPSTICK: NEGATIVE
KETONES UR: NEGATIVE mg/dL
Nitrite: NEGATIVE
PH: 5.5 (ref 5.0–8.0)
Protein, ur: 100 mg/dL — AB
SPECIFIC GRAVITY, URINE: 1.021 (ref 1.005–1.030)
Urobilinogen, UA: 0.2 mg/dL (ref 0.0–1.0)

## 2014-03-23 LAB — RAPID URINE DRUG SCREEN, HOSP PERFORMED
AMPHETAMINES: NOT DETECTED
BARBITURATES: NOT DETECTED
BENZODIAZEPINES: NOT DETECTED
COCAINE: NOT DETECTED
Opiates: NOT DETECTED
TETRAHYDROCANNABINOL: POSITIVE — AB

## 2014-03-23 LAB — POC URINE PREG, ED: PREG TEST UR: NEGATIVE

## 2014-03-23 MED ORDER — FLUCONAZOLE 150 MG PO TABS
150.0000 mg | ORAL_TABLET | Freq: Once | ORAL | Status: AC
  Administered 2014-03-23: 150 mg via ORAL
  Filled 2014-03-23: qty 1

## 2014-03-23 MED ORDER — CEPHALEXIN 500 MG PO CAPS: 500.0000 mg | ORAL_CAPSULE | Freq: Four times a day (QID) | ORAL | Status: DC

## 2014-03-23 MED ORDER — TETANUS-DIPHTH-ACELL PERTUSSIS 5-2.5-18.5 LF-MCG/0.5 IM SUSP
0.5000 mL | Freq: Once | INTRAMUSCULAR | Status: AC
  Administered 2014-03-23: 0.5 mL via INTRAMUSCULAR
  Filled 2014-03-23: qty 0.5

## 2014-03-23 MED ORDER — LORAZEPAM 2 MG/ML IJ SOLN
2.0000 mg | Freq: Once | INTRAMUSCULAR | Status: AC
  Administered 2014-03-23: 2 mg via INTRAVENOUS
  Filled 2014-03-23: qty 1
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
## 2014-03-23 NOTE — Progress Notes (Signed)
[facility] provided pt with a list of primary care resources to help patient establish a pcp.  °

## 2014-03-23 NOTE — Discharge Instructions (Signed)
Marijuana Abuse Your exam shows you have used marijuana or pot. There are many health problems related to marijuana abuse. These include:  Bronchitis.  Chronic cough.  Emphysema.  Lung and upper airway cancer. Abusers also experience impairment in:  Memory.  Judgment.  Ability to learn.  Coordination. Students who smoke marijuana:  Get lower grades.  Are less likely to graduate than those who do not. Adults who abuse marijuana:  Have problems at work.  May even lose their jobs due to:  Poor work International aid/development workerperformance.  Absenteeism. Attention, memory, and learning skills have been shown to be diminished for up to 6 months after stopping regular use, and there is evidence that the effects can be cumulative over a lifetime.  Heavier use of marijuana also puts a strain on relationships with friends and loved ones and can lead to moodiness and loss of confidence. Acute intoxication can lead to:  Increased anxiety.  A panic episode. It also increases the risk for having an automobile accident. This is especially true if the pot is combined with alcohol or other intoxicants. Treatment for acute intoxication is rarely needed. However, medicine to reduce anxiety may be helpful in some people. Millions of people are considered to be dependent on marijuana. It is long-term regular use that leads to addiction and all of its complex problems. Information on the problem of addiction and the health problems of long-term abuse is posted at the [facility] for Drug Abuse website, http://www.price-smith.com/www.drugabuse.gov. Consult with your doctor or counselor if you want further information and support in handling this common problem. Document Released: 10/01/2004 Document Revised: 11/16/2011 Document Reviewed: 07/19/2007 [facility] Patient Information 2015 [employer], [place]. This information is not intended to replace advice given to you by your health care provider. Make sure you discuss any questions you have with your  health care provider.   Urinary Tract Infection Urinary tract infections (UTIs) can develop anywhere along your urinary tract. Your urinary tract is your body's drainage system for removing wastes and extra water. Your urinary tract includes two kidneys, two ureters, a bladder, and a urethra. Your kidneys are a pair of bean-shaped organs. Each kidney is about the size of your fist. They are located below your ribs, one on each side of your spine. CAUSES Infections are caused by microbes, which are microscopic organisms, including fungi, viruses, and bacteria. These organisms are so small that they can only be seen through a microscope. Bacteria are the microbes that most commonly cause UTIs. SYMPTOMS  Symptoms of UTIs may vary by age and gender of the patient and by the location of the infection. Symptoms in young women typically include a frequent and intense urge to urinate and a painful, burning feeling in the bladder or urethra during urination. Older women and men are more likely to be tired, shaky, and weak and have muscle aches and abdominal pain. A fever may mean the infection is in your kidneys. Other symptoms of a kidney infection include pain in your back or sides below the ribs, nausea, and vomiting. DIAGNOSIS To diagnose a UTI, your caregiver will ask you about your symptoms. Your caregiver also will ask to provide a urine sample. The urine sample will be tested for bacteria and white blood cells. White blood cells are made by your body to help fight infection. TREATMENT  Typically, UTIs can be treated with medication. Because most UTIs are caused by a bacterial infection, they usually can be treated with the use of antibiotics. The choice of  antibiotic and length of treatment depend on your symptoms and the type of bacteria causing your infection. HOME CARE INSTRUCTIONS  If you were prescribed antibiotics, take them exactly as your caregiver instructs you. Finish the medication even if  you feel better after you have only taken some of the medication.  Drink enough water and fluids to keep your urine clear or pale yellow.  Avoid caffeine, tea, and carbonated beverages. They tend to irritate your bladder.  Empty your bladder often. Avoid holding urine for long periods of time.  Empty your bladder before and after sexual intercourse.  After a bowel movement, women should cleanse from front to back. Use each tissue only once. SEEK MEDICAL CARE IF:   You have back pain.  You develop a fever.  Your symptoms do not begin to resolve within 3 days. SEEK IMMEDIATE MEDICAL CARE IF:   You have severe back pain or lower abdominal pain.  You develop chills.  You have nausea or vomiting.  You have continued burning or discomfort with urination. MAKE SURE YOU:   Understand these instructions.  Will watch your condition.  Will get help right away if you are not doing well or get worse. Document Released: 06/03/2005 Document Revised: 02/23/2012 Document Reviewed: 10/02/2011 [facility] Patient Information 2015 [place], [place]. This information is not intended to replace advice given to you by your health care provider. Make sure you discuss any questions you have with your health care provider.

## 2014-03-23 NOTE — ED Notes (Signed)
Harris, PA at bedside.  

## 2014-03-23 NOTE — ED Notes (Signed)
Bed: WA01 Expected date:  Expected time:  Means of arrival:  Comments: EMS-altered from smoking MaryJane

## 2014-03-23 NOTE — ED Notes (Signed)
States she is unable to give urine sample at this time

## 2014-03-24 LAB — URINE CULTURE: Colony Count: 8000

## 2014-03-27 NOTE — ED Provider Notes (Signed)
Medical screening examination/treatment/procedure(s) were conducted as a shared visit with non-physician practitioner(s) and myself.  I personally evaluated the patient during the encounter.   EKG Interpretation None      I interviewed and examined the patient. Lungs are CTAB. Cardiac exam wnl. Abdomen soft.  Pt well appearing on my exam. Continues to appear well after monitoring in the ED. Will d/c home. Educated on not using drugs.   Junius ArgyleForrest S Noami Bove, MD 03/27/14 2251

## 2014-06-24 ENCOUNTER — Emergency Department (HOSPITAL_COMMUNITY): Payer: Self-pay

## 2014-06-24 ENCOUNTER — Encounter (HOSPITAL_COMMUNITY): Payer: Self-pay | Admitting: Emergency Medicine

## 2014-06-24 ENCOUNTER — Emergency Department (HOSPITAL_COMMUNITY)
Admission: EM | Admit: 2014-06-24 | Discharge: 2014-06-24 | Disposition: A | Discharge status: Home / Self Care | Payer: Self-pay | Attending: Emergency Medicine | Admitting: Emergency Medicine

## 2014-06-24 DIAGNOSIS — B9789 Other viral agents as the cause of diseases classified elsewhere: Secondary | ICD-10-CM

## 2014-06-24 DIAGNOSIS — H6502 Acute serous otitis media, left ear: Secondary | ICD-10-CM | POA: Insufficient documentation

## 2014-06-24 DIAGNOSIS — J069 Acute upper respiratory infection, unspecified: Secondary | ICD-10-CM | POA: Insufficient documentation

## 2014-06-24 DIAGNOSIS — Z79899 Other long term (current) drug therapy: Secondary | ICD-10-CM | POA: Insufficient documentation

## 2014-06-24 DIAGNOSIS — J45909 Unspecified asthma, uncomplicated: Secondary | ICD-10-CM | POA: Insufficient documentation

## 2014-06-24 DIAGNOSIS — Z792 Long term (current) use of antibiotics: Secondary | ICD-10-CM | POA: Insufficient documentation

## 2014-06-24 LAB — I-STAT TROPONIN, ED: Troponin i, poc: 0 ng/mL (ref 0.00–0.08)

## 2014-06-24 LAB — PRO B NATRIURETIC PEPTIDE: Pro B Natriuretic peptide (BNP): 27.7 pg/mL (ref 0–125)

## 2014-06-24 LAB — CBC
HCT: 35.6 % — ABNORMAL LOW (ref 36.0–46.0)
Hemoglobin: 11.3 g/dL — ABNORMAL LOW (ref 12.0–15.0)
MCH: 25.5 pg — ABNORMAL LOW (ref 26.0–34.0)
MCHC: 31.7 g/dL (ref 30.0–36.0)
MCV: 80.4 fL (ref 78.0–100.0)
Platelets: 241 10*3/uL (ref 150–400)
RBC: 4.43 MIL/uL (ref 3.87–5.11)
RDW: 14.8 % (ref 11.5–15.5)
WBC: 10.6 10*3/uL — ABNORMAL HIGH (ref 4.0–10.5)

## 2014-06-24 MED ORDER — OXYCODONE-ACETAMINOPHEN 5-325 MG PO TABS
1.0000 | ORAL_TABLET | Freq: Once | ORAL | Status: AC
  Administered 2014-06-24: 1 via ORAL
  Filled 2014-06-24: qty 1

## 2014-06-24 MED ORDER — GUAIFENESIN-CODEINE 100-10 MG/5ML PO SOLN: 5.0000 mL | Freq: Three times a day (TID) | ORAL | Status: DC | PRN

## 2014-06-24 MED ORDER — AMOXICILLIN 500 MG PO CAPS: 500.0000 mg | ORAL_CAPSULE | Freq: Three times a day (TID) | ORAL | Status: DC

## 2014-06-24 MED ORDER — IBUPROFEN 400 MG PO TABS
600.0000 mg | ORAL_TABLET | Freq: Once | ORAL | Status: AC
  Administered 2014-06-24: 600 mg via ORAL
  Filled 2014-06-24 (×2): qty 1

## 2014-06-24 MED ORDER — DEXAMETHASONE 4 MG PO TABS
12.0000 mg | ORAL_TABLET | Freq: Once | ORAL | Status: AC
  Administered 2014-06-24: 12 mg via ORAL
  Filled 2014-06-24: qty 3

## 2014-06-24 NOTE — ED Notes (Signed)
Pt. Stated, I started having sore throat, then my ears were I can't hear, and yesterday I started having chest pain really bad.  I've had chest pain before cause of asthma but this time it was a lot more painful.

## 2014-06-24 NOTE — ED Notes (Signed)
Patient transported to X-ray 

## 2014-06-24 NOTE — Discharge Instructions (Signed)
Otitis Media °Otitis media is redness, soreness, and inflammation of the middle ear. Otitis media may be caused by allergies or, most commonly, by infection. Often it occurs as a complication of the common cold. °SIGNS AND SYMPTOMS °Symptoms of otitis media may include: °· Earache. °· Fever. °· Ringing in your ear. °· Headache. °· Leakage of fluid from the ear. °DIAGNOSIS °To diagnose otitis media, your health care provider will examine your ear with an otoscope. This is an instrument that allows your health care provider to see into your ear in order to examine your eardrum. Your health care provider also will ask you questions about your symptoms. °TREATMENT  °Typically, otitis media resolves on its own within 3-5 days. Your health care provider may prescribe medicine to ease your symptoms of pain. If otitis media does not resolve within 5 days or is recurrent, your health care provider may prescribe antibiotic medicines if he or she suspects that a bacterial infection is the cause. °HOME CARE INSTRUCTIONS  °· If you were prescribed an antibiotic medicine, finish it all even if you start to feel better. °· Take medicines only as directed by your health care provider. °· Keep all follow-up visits as directed by your health care provider. °SEEK MEDICAL CARE IF: °· You have otitis media only in one ear, or bleeding from your nose, or both. °· You notice a lump on your neck. °· You are not getting better in 3-5 days. °· You feel worse instead of better. °SEEK IMMEDIATE MEDICAL CARE IF:  °· You have pain that is not controlled with medicine. °· You have swelling, redness, or pain around your ear or stiffness in your neck. °· You notice that part of your face is paralyzed. °· You notice that the bone behind your ear (mastoid) is tender when you touch it. °MAKE SURE YOU:  °· Understand these instructions. °· Will watch your condition. °· Will get help right away if you are not doing well or get worse. °Document Released:  05/29/2004 Document Revised: 01/08/2014 Document Reviewed: 03/21/2013 °ExitCare® Patient Information ©2015 [employer], LLC. This information is not intended to replace advice given to you by your health care provider. Make sure you discuss any questions you have with your health care provider. ° °Upper Respiratory Infection, Adult °An upper respiratory infection (URI) is also sometimes known as the common cold. The upper respiratory tract includes the nose, sinuses, throat, trachea, and bronchi. Bronchi are the airways leading to the lungs. Most people improve within 1 week, but symptoms can last up to 2 weeks. A residual cough may last even longer.  °CAUSES °Many different viruses can infect the tissues lining the upper respiratory tract. The tissues become irritated and inflamed and often become very moist. Mucus production is also common. A cold is contagious. You can easily spread the virus to others by oral contact. This includes kissing, sharing a glass, coughing, or sneezing. Touching your mouth or nose and then touching a surface, which is then touched by another person, can also spread the virus. °SYMPTOMS  °Symptoms typically develop 1 to 3 days after you come in contact with a cold virus. Symptoms vary from person to person. They may include: °· Runny nose. °· Sneezing. °· Nasal congestion. °· Sinus irritation. °· Sore throat. °· Loss of voice (laryngitis). °· Cough. °· Fatigue. °· Muscle aches. °· Loss of appetite. °· Headache. °· Low-grade fever. °DIAGNOSIS  °You might diagnose your own cold based on familiar symptoms, since most people get   a cold 2 to 3 times a year. Your caregiver can confirm this based on your exam. Most importantly, your caregiver can check that your symptoms are not due to another disease such as strep throat, sinusitis, pneumonia, asthma, or epiglottitis. Blood tests, throat tests, and X-rays are not necessary to diagnose a common cold, but they may sometimes be helpful in excluding  other more serious diseases. Your caregiver will decide if any further tests are required. °RISKS AND COMPLICATIONS  °You may be at risk for a more severe case of the common cold if you smoke cigarettes, have chronic heart disease (such as heart failure) or lung disease (such as asthma), or if you have a weakened immune system. The very young and very old are also at risk for more serious infections. Bacterial sinusitis, middle ear infections, and bacterial pneumonia can complicate the common cold. The common cold can worsen asthma and chronic obstructive pulmonary disease (COPD). Sometimes, these complications can require emergency medical care and may be life-threatening. °PREVENTION  °The best way to protect against getting a cold is to practice good hygiene. Avoid oral or hand contact with people with cold symptoms. Wash your hands often if contact occurs. There is no clear evidence that vitamin C, vitamin E, echinacea, or exercise reduces the chance of developing a cold. However, it is always recommended to get plenty of rest and practice good nutrition. °TREATMENT  °Treatment is directed at relieving symptoms. There is no cure. Antibiotics are not effective, because the infection is caused by a virus, not by bacteria. Treatment may include: °· Increased fluid intake. Sports drinks offer valuable electrolytes, sugars, and fluids. °· Breathing heated mist or steam (vaporizer or shower). °· Eating chicken soup or other clear broths, and maintaining good nutrition. °· Getting plenty of rest. °· Using gargles or lozenges for comfort. °· Controlling fevers with ibuprofen or acetaminophen as directed by your caregiver. °· Increasing usage of your inhaler if you have asthma. °Zinc gel and zinc lozenges, taken in the first 24 hours of the common cold, can shorten the duration and lessen the severity of symptoms. Pain medicines may help with fever, muscle aches, and throat pain. A variety of non-prescription medicines  are available to treat congestion and runny nose. Your caregiver can make recommendations and may suggest nasal or lung inhalers for other symptoms.  °HOME CARE INSTRUCTIONS  °· Only take over-the-counter or prescription medicines for pain, discomfort, or fever as directed by your caregiver. °· Use a warm mist humidifier or inhale steam from a shower to increase air moisture. This may keep secretions moist and make it easier to breathe. °· Drink enough water and fluids to keep your urine clear or pale yellow. °· Rest as needed. °· Return to work when your temperature has returned to normal or as your caregiver advises. You may need to stay home longer to avoid infecting others. You can also use a face mask and careful hand washing to prevent spread of the virus. °SEEK MEDICAL CARE IF:  °· After the first few days, you feel you are getting worse rather than better. °· You need your caregiver's advice about medicines to control symptoms. °· You develop chills, worsening shortness of breath, or brown or red sputum. These may be signs of pneumonia. °· You develop yellow or brown nasal discharge or pain in the face, especially when you bend forward. These may be signs of sinusitis. °· You develop a fever, swollen neck glands, pain with swallowing, or white   areas in the back of your throat. These may be signs of strep throat. °SEEK IMMEDIATE MEDICAL CARE IF:  °· You have a fever. °· You develop severe or persistent headache, ear pain, sinus pain, or chest pain. °· You develop wheezing, a prolonged cough, cough up blood, or have a change in your usual mucus (if you have chronic lung disease). °· You develop sore muscles or a stiff neck. °Document Released: 02/17/2001 Document Revised: 11/16/2011 Document Reviewed: 11/29/2013 °ExitCare® Patient Information ©2015 [employer], LLC. This information is not intended to replace advice given to you by your health care provider. Make sure you discuss any questions you have with your  health care provider. ° °

## 2014-06-24 NOTE — ED Provider Notes (Signed)
CSN: [id number]     Arrival date & time 06/24/14  1214 History   First MD Initiated Contact with Patient 06/24/14 1512     Chief Complaint  Patient presents with  . Chest Pain  . Otalgia  . Sore Throat     (Consider location/radiation/quality/duration/timing/severity/associated sxs/prior Treatment) HPI  18yF with sore throat, b/l ear pain and CP. Onset 2 days ago. slowly worsening. CP is sharp. Worse with coughing/movement. Hx of asthma but says this feels different. Cough nonproductive. Subjective fever. Feels fatigued. Achy all over. No unusual leg pain or swelling.   Past Medical History  Diagnosis Date  . Asthma    History reviewed. No pertinent past surgical history. Family History  Problem Relation Age of Onset  . Hypertension Mother   . Cancer Mother   . Stroke Mother    History  Substance Use Topics  . Smoking status: Never Smoker   . Smokeless tobacco: Not on file  . Alcohol Use: No     Comment: occasional   OB History   Grav Para Term Preterm Abortions TAB SAB Ect Mult Living   0 0 0 0 0 0 0 0 0 0      Review of Systems   All systems reviewed and negative, other than as noted in HPI.   Allergies  Review of patient's allergies indicates no known allergies.  Home Medications   Prior to Admission medications   Medication Sig Start Date End Date Taking? Authorizing Provider  albuterol (PROVENTIL HFA;VENTOLIN HFA) 108 (90 BASE) MCG/ACT inhaler Inhale 2 puffs into the lungs every 6 (six) hours as needed for wheezing or shortness of breath.   Yes Historical Provider, MD  guaiFENesin (ROBITUSSIN) 100 MG/5ML liquid Take 400 mg by mouth 3 (three) times daily as needed for cough or congestion.   Yes Historical Provider, MD  Ibuprofen-Diphenhydramine HCl (ADVIL PM) 200-25 MG CAPS Take 2 tablets by mouth at bedtime as needed (for pain / sleep).    Yes Historical Provider, MD  amoxicillin (AMOXIL) 500 MG capsule Take 1 capsule (500 mg total) by mouth 3 (three) times  daily. 06/24/14   Raeford RazorStephen Alaisha Eversley, MD  guaiFENesin-codeine 100-10 MG/5ML syrup Take 5-10 mLs by mouth 3 (three) times daily as needed for cough. 06/24/14   Raeford RazorStephen Hye Trawick, MD   BP 117/70  Pulse 65  Temp(Src) 98.7 F (37.1 C) (Oral)  Resp 18  SpO2 100%  LMP 06/22/2014 Physical Exam  Nursing note and vitals reviewed. Constitutional: She appears well-developed and well-nourished. No distress.  HENT:  Head: Normocephalic and atraumatic.  Left Ear: External ear normal.  Mouth/Throat: Oropharynx is clear and moist.  R TM dull. Air/lfuid levels. Boggy nasal mucosa  Eyes: Conjunctivae are normal. Pupils are equal, round, and reactive to light. Right eye exhibits no discharge. Left eye exhibits no discharge.  Neck: Neck supple.  Cardiovascular: Normal rate, regular rhythm and normal heart sounds.  Exam reveals no gallop and no friction rub.   No murmur heard. Pulmonary/Chest: Effort normal and breath sounds normal. No respiratory distress.  Abdominal: Soft. She exhibits no distension. There is no tenderness.  Musculoskeletal: She exhibits no edema and no tenderness.  Neurological: She is alert.  Skin: Skin is warm and dry.  Psychiatric: She has a normal mood and affect. Her behavior is normal. Thought content normal.    ED Course  Procedures (including critical care time) Labs Review Labs Reviewed  CBC - Abnormal; Notable for the following:    WBC 10.6 (*)  Hemoglobin 11.3 (*)    HCT 35.6 (*)    MCH 25.5 (*)    All other components within normal limits  PRO B NATRIURETIC PEPTIDE  I-STAT TROPOININ, ED    Imaging Review No results found.  Dg Chest 2 View  06/24/2014   CLINICAL DATA:  Chest pain  EXAM: CHEST  2 VIEW  COMPARISON:  12/04/2013  FINDINGS: The heart size and mediastinal contours are within normal limits. Both lungs are clear. The visualized skeletal structures are unremarkable.  IMPRESSION: No active cardiopulmonary disease.   Electronically Signed   By: Elige KoHetal  Patel    On: 06/24/2014 17:08    EKG Interpretation None      MDM   Final diagnoses:  Acute serous otitis media of left ear, recurrence not specified  Viral URI with cough    18yF with likely viral URI and L otitis media. Low suspicion for SBI.     Raeford RazorStephen Jakyle Petrucelli, MD 07/02/14 [phone]

## 2014-11-22 ENCOUNTER — Emergency Department (HOSPITAL_COMMUNITY)
Admission: EM | Admit: 2014-11-22 | Discharge: 2014-11-22 | Disposition: A | Discharge status: Home / Self Care | Payer: Self-pay | Attending: Emergency Medicine | Admitting: Emergency Medicine

## 2014-11-22 ENCOUNTER — Encounter (HOSPITAL_COMMUNITY): Payer: Self-pay | Admitting: *Deleted

## 2014-11-22 DIAGNOSIS — Z792 Long term (current) use of antibiotics: Secondary | ICD-10-CM | POA: Insufficient documentation

## 2014-11-22 DIAGNOSIS — Z79899 Other long term (current) drug therapy: Secondary | ICD-10-CM | POA: Insufficient documentation

## 2014-11-22 DIAGNOSIS — J45909 Unspecified asthma, uncomplicated: Secondary | ICD-10-CM | POA: Insufficient documentation

## 2014-11-22 DIAGNOSIS — J069 Acute upper respiratory infection, unspecified: Secondary | ICD-10-CM | POA: Insufficient documentation

## 2014-11-22 MED ORDER — CETIRIZINE-PSEUDOEPHEDRINE ER 5-120 MG PO TB12: 1.0000 | ORAL_TABLET | Freq: Every day | ORAL | Status: DC

## 2014-11-22 NOTE — Discharge Instructions (Signed)
Upper Respiratory Infection, Adult An upper respiratory infection (URI) is also sometimes known as the common cold. The upper respiratory tract includes the nose, sinuses, throat, trachea, and bronchi. Bronchi are the airways leading to the lungs. Most people improve within 1 week, but symptoms can last up to 2 weeks. A residual cough may last even longer.  CAUSES Many different viruses can infect the tissues lining the upper respiratory tract. The tissues become irritated and inflamed and often become very moist. Mucus production is also common. A cold is contagious. You can easily spread the virus to others by oral contact. This includes kissing, sharing a glass, coughing, or sneezing. Touching your mouth or nose and then touching a surface, which is then touched by another person, can also spread the virus. SYMPTOMS  Symptoms typically develop 1 to 3 days after you come in contact with a cold virus. Symptoms vary from person to person. They may include:  Runny nose.  Sneezing.  Nasal congestion.  Sinus irritation.  Sore throat.  Loss of voice (laryngitis).  Cough.  Fatigue.  Muscle aches.  Loss of appetite.  Headache.  Low-grade fever. DIAGNOSIS  You might diagnose your own cold based on familiar symptoms, since most people get a cold 2 to 3 times a year. Your caregiver can confirm this based on your exam. Most importantly, your caregiver can check that your symptoms are not due to another disease such as strep throat, sinusitis, pneumonia, asthma, or epiglottitis. Blood tests, throat tests, and X-rays are not necessary to diagnose a common cold, but they may sometimes be helpful in excluding other more serious diseases. Your caregiver will decide if any further tests are required. RISKS AND COMPLICATIONS  You may be at risk for a more severe case of the common cold if you smoke cigarettes, have chronic heart disease (such as heart failure) or lung disease (such as asthma), or if  you have a weakened immune system. The very young and very old are also at risk for more serious infections. Bacterial sinusitis, middle ear infections, and bacterial pneumonia can complicate the common cold. The common cold can worsen asthma and chronic obstructive pulmonary disease (COPD). Sometimes, these complications can require emergency medical care and may be life-threatening. PREVENTION  The best way to protect against getting a cold is to practice good hygiene. Avoid oral or hand contact with people with cold symptoms. Wash your hands often if contact occurs. There is no clear evidence that vitamin C, vitamin E, echinacea, or exercise reduces the chance of developing a cold. However, it is always recommended to get plenty of rest and practice good nutrition. TREATMENT  Treatment is directed at relieving symptoms. There is no cure. Antibiotics are not effective, because the infection is caused by a virus, not by bacteria. Treatment may include:  Increased fluid intake. Sports drinks offer valuable electrolytes, sugars, and fluids.  Breathing heated mist or steam (vaporizer or shower).  Eating chicken soup or other clear broths, and maintaining good nutrition.  Getting plenty of rest.  Using gargles or lozenges for comfort.  Controlling fevers with ibuprofen or acetaminophen as directed by your caregiver.  Increasing usage of your inhaler if you have asthma. Zinc gel and zinc lozenges, taken in the first 24 hours of the common cold, can shorten the duration and lessen the severity of symptoms. Pain medicines may help with fever, muscle aches, and throat pain. A variety of non-prescription medicines are available to treat congestion and runny nose. Your caregiver   can make recommendations and may suggest nasal or lung inhalers for other symptoms.  HOME CARE INSTRUCTIONS   Only take over-the-counter or prescription medicines for pain, discomfort, or fever as directed by your  caregiver.  Use a warm mist humidifier or inhale steam from a shower to increase air moisture. This may keep secretions moist and make it easier to breathe.  Drink enough water and fluids to keep your urine clear or pale yellow.  Rest as needed.  Return to work when your temperature has returned to normal or as your caregiver advises. You may need to stay home longer to avoid infecting others. You can also use a face mask and careful hand washing to prevent spread of the virus. SEEK MEDICAL CARE IF:   After the first few days, you feel you are getting worse rather than better.  You need your caregiver's advice about medicines to control symptoms.  You develop chills, worsening shortness of breath, or brown or red sputum. These may be signs of pneumonia.  You develop yellow or brown nasal discharge or pain in the face, especially when you bend forward. These may be signs of sinusitis.  You develop a fever, swollen neck glands, pain with swallowing, or white areas in the back of your throat. These may be signs of strep throat. SEEK IMMEDIATE MEDICAL CARE IF:   You have a fever.  You develop severe or persistent headache, ear pain, sinus pain, or chest pain.  You develop wheezing, a prolonged cough, cough up blood, or have a change in your usual mucus (if you have chronic lung disease).  You develop sore muscles or a stiff neck. Document Released: 02/17/2001 Document Revised: 11/16/2011 Document Reviewed: 11/29/2013 [employer] Patient Information 2015 [employer]. This information is not intended to replace advice given to you by your health care provider. Make sure you discuss any questions you have with your health care provider.  

## 2014-11-22 NOTE — ED Notes (Signed)
Declined W/C at D/C and was escorted to lobby by RN. 

## 2014-11-22 NOTE — ED Provider Notes (Signed)
CSN: [id number]     Arrival date & time 11/22/14  0741 History   First MD Initiated Contact with Patient 11/22/14 [phone]     No chief complaint on file.    (Consider location/radiation/quality/duration/timing/severity/associated sxs/prior Treatment) HPI Comments: Pt comes in with c/o sore throat ear pain and red eye times a couple of days. Denies fever. States that she took robitussin without relief. Denies neck soreness. No cough.  The history is provided by the patient. No language interpreter was used.    Past Medical History  Diagnosis Date  . Asthma    No past surgical history on file. Family History  Problem Relation Age of Onset  . Hypertension Mother   . Cancer Mother   . Stroke Mother    History  Substance Use Topics  . Smoking status: Never Smoker   . Smokeless tobacco: Not on file  . Alcohol Use: No     Comment: occasional   OB History    Gravida Para Term Preterm AB TAB SAB Ectopic Multiple Living   0 0 0 0 0 0 0 0 0 0      Review of Systems  All other systems reviewed and are negative.     Allergies  Review of patient's allergies indicates no known allergies.  Home Medications   Prior to Admission medications   Medication Sig Start Date End Date Taking? Authorizing Provider  albuterol (PROVENTIL HFA;VENTOLIN HFA) 108 (90 BASE) MCG/ACT inhaler Inhale 2 puffs into the lungs every 6 (six) hours as needed for wheezing or shortness of breath.    Historical Provider, MD  amoxicillin (AMOXIL) 500 MG capsule Take 1 capsule (500 mg total) by mouth 3 (three) times daily. 06/24/14   Raeford RazorStephen Kohut, MD  guaiFENesin (ROBITUSSIN) 100 MG/5ML liquid Take 400 mg by mouth 3 (three) times daily as needed for cough or congestion.    Historical Provider, MD  guaiFENesin-codeine 100-10 MG/5ML syrup Take 5-10 mLs by mouth 3 (three) times daily as needed for cough. 06/24/14   Raeford RazorStephen Kohut, MD  Ibuprofen-Diphenhydramine HCl (ADVIL PM) 200-25 MG CAPS Take 2 tablets by mouth at  bedtime as needed (for pain / sleep).     Historical Provider, MD   There were no vitals taken for this visit. Physical Exam  Constitutional: She appears well-developed and well-nourished.  HENT:  Right Ear: External ear normal.  Left Ear: External ear normal.  Nose: Rhinorrhea present.  Mouth/Throat: Posterior oropharyngeal edema and posterior oropharyngeal erythema present. No oropharyngeal exudate or tonsillar abscesses.  Eyes: EOM are normal. Pupils are equal, round, and reactive to light. Right eye exhibits no discharge. Right conjunctiva is injected.  Cardiovascular: Normal rate and regular rhythm.   Pulmonary/Chest: Effort normal and breath sounds normal.  Nursing note and vitals reviewed.   ED Course  Procedures (including critical care time) Labs Review Labs Reviewed - No data to display  Imaging Review No results found.   EKG Interpretation None      MDM   Final diagnoses:  URI (upper respiratory infection)    Doubt strep more likely uri symptoms related to allergies. Discussed supportive care and return precautions    Teressa LowerVrinda Sianna Garofano, NP 11/22/14 [phone]  Arby BarretteMarcy Pfeiffer, MD 11/24/14 [phone]

## 2014-11-22 NOTE — ED Notes (Signed)
Pt reports a one week Hx of sore throat and ear pain.

## 2015-03-05 ENCOUNTER — Emergency Department (HOSPITAL_COMMUNITY): Payer: Self-pay

## 2015-03-05 ENCOUNTER — Emergency Department (HOSPITAL_COMMUNITY)
Admission: EM | Admit: 2015-03-05 | Discharge: 2015-03-05 | Disposition: A | Discharge status: Home / Self Care | Payer: Self-pay | Attending: Emergency Medicine | Admitting: Emergency Medicine

## 2015-03-05 ENCOUNTER — Encounter (HOSPITAL_COMMUNITY): Payer: Self-pay | Admitting: *Deleted

## 2015-03-05 DIAGNOSIS — Z79899 Other long term (current) drug therapy: Secondary | ICD-10-CM | POA: Insufficient documentation

## 2015-03-05 DIAGNOSIS — M545 Low back pain, unspecified: Secondary | ICD-10-CM

## 2015-03-05 DIAGNOSIS — N390 Urinary tract infection, site not specified: Secondary | ICD-10-CM | POA: Insufficient documentation

## 2015-03-05 DIAGNOSIS — J45909 Unspecified asthma, uncomplicated: Secondary | ICD-10-CM | POA: Insufficient documentation

## 2015-03-05 DIAGNOSIS — G8929 Other chronic pain: Secondary | ICD-10-CM | POA: Insufficient documentation

## 2015-03-05 DIAGNOSIS — S3992XA Unspecified injury of lower back, initial encounter: Secondary | ICD-10-CM | POA: Insufficient documentation

## 2015-03-05 DIAGNOSIS — Z3202 Encounter for pregnancy test, result negative: Secondary | ICD-10-CM | POA: Insufficient documentation

## 2015-03-05 DIAGNOSIS — Y92410 Unspecified street and highway as the place of occurrence of the external cause: Secondary | ICD-10-CM | POA: Insufficient documentation

## 2015-03-05 DIAGNOSIS — Y998 Other external cause status: Secondary | ICD-10-CM | POA: Insufficient documentation

## 2015-03-05 DIAGNOSIS — Z792 Long term (current) use of antibiotics: Secondary | ICD-10-CM | POA: Insufficient documentation

## 2015-03-05 DIAGNOSIS — Y9389 Activity, other specified: Secondary | ICD-10-CM | POA: Insufficient documentation

## 2015-03-05 LAB — URINE MICROSCOPIC-ADD ON

## 2015-03-05 LAB — POC URINE PREG, ED: Preg Test, Ur: NEGATIVE

## 2015-03-05 LAB — URINALYSIS, ROUTINE W REFLEX MICROSCOPIC
BILIRUBIN URINE: NEGATIVE
Glucose, UA: NEGATIVE mg/dL
Ketones, ur: NEGATIVE mg/dL
Nitrite: NEGATIVE
Protein, ur: NEGATIVE mg/dL
Specific Gravity, Urine: 1.014 (ref 1.005–1.030)
UROBILINOGEN UA: 0.2 mg/dL (ref 0.0–1.0)
pH: 6.5 (ref 5.0–8.0)

## 2015-03-05 MED ORDER — CEPHALEXIN 500 MG PO CAPS: 500.0000 mg | ORAL_CAPSULE | Freq: Three times a day (TID) | ORAL | Status: DC

## 2015-03-05 MED ORDER — CYCLOBENZAPRINE HCL 10 MG PO TABS: 10.0000 mg | ORAL_TABLET | Freq: Two times a day (BID) | ORAL | Status: DC | PRN

## 2015-03-05 MED ORDER — NAPROXEN 500 MG PO TABS: 500.0000 mg | ORAL_TABLET | Freq: Two times a day (BID) | ORAL | Status: DC

## 2015-03-05 NOTE — ED Notes (Signed)
Bed: WTR5 Expected date:  Expected time:  Means of arrival:  Comments: Sent from [facility]
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
## 2015-03-05 NOTE — Discharge Instructions (Signed)
Naprosyn for pain and inflammation. Flexeril for muscle spasms. Keflex as prescribed until all gone for bladder infection. Follow up with your doctor or urgent care if not improving or return if worsening.   Muscle Strain A muscle strain is an injury that occurs when a muscle is stretched beyond its normal length. Usually a small number of muscle fibers are torn when this happens. Muscle strain is rated in degrees. First-degree strains have the least amount of muscle fiber tearing and pain. Second-degree and third-degree strains have increasingly more tearing and pain.  Usually, recovery from muscle strain takes 1-2 weeks. Complete healing takes 5-6 weeks.  CAUSES  Muscle strain happens when a sudden, violent force placed on a muscle stretches it too far. This may occur with lifting, sports, or a fall.  RISK FACTORS Muscle strain is especially common in athletes.  SIGNS AND SYMPTOMS At the site of the muscle strain, there may be:  Pain.  Bruising.  Swelling.  Difficulty using the muscle due to pain or lack of normal function. DIAGNOSIS  Your health care provider will perform a physical exam and ask about your medical history. TREATMENT  Often, the best treatment for a muscle strain is resting, icing, and applying cold compresses to the injured area.  HOME CARE INSTRUCTIONS   Use the PRICE method of treatment to promote muscle healing during the first 2-3 days after your injury. The PRICE method involves:  Protecting the muscle from being injured again.  Restricting your activity and resting the injured body part.  Icing your injury. To do this, put ice in a plastic bag. Place a towel between your skin and the bag. Then, apply the ice and leave it on from 15-20 minutes each hour. After the third day, switch to moist heat packs.  Apply compression to the injured area with a splint or elastic bandage. Be careful not to wrap it too tightly. This may interfere with blood circulation or  increase swelling.  Elevate the injured body part above the level of your heart as often as you can.  Only take over-the-counter or prescription medicines for pain, discomfort, or fever as directed by your health care provider.  Warming up prior to exercise helps to prevent future muscle strains. SEEK MEDICAL CARE IF:   You have increasing pain or swelling in the injured area.  You have numbness, tingling, or a significant loss of strength in the injured area. MAKE SURE YOU:   Understand these instructions.  Will watch your condition.  Will get help right away if you are not doing well or get worse. Document Released: 08/24/2005 Document Revised: 06/14/2013 Document Reviewed: 03/23/2013 [facility] Patient Information 2015 [place], [place]. This information is not intended to replace advice given to you by your health care provider. Make sure you discuss any questions you have with your health care provider.  Urinary Tract Infection Urinary tract infections (UTIs) can develop anywhere along your urinary tract. Your urinary tract is your body's drainage system for removing wastes and extra water. Your urinary tract includes two kidneys, two ureters, a bladder, and a urethra. Your kidneys are a pair of bean-shaped organs. Each kidney is about the size of your fist. They are located below your ribs, one on each side of your spine. CAUSES Infections are caused by microbes, which are microscopic organisms, including fungi, viruses, and bacteria. These organisms are so small that they can only be seen through a microscope. Bacteria are the microbes that most commonly cause UTIs. SYMPTOMS  Symptoms of UTIs may vary by age and gender of the patient and by the location of the infection. Symptoms in young women typically include a frequent and intense urge to urinate and a painful, burning feeling in the bladder or urethra during urination. Older women and men are more likely to be tired, shaky, and  weak and have muscle aches and abdominal pain. A fever may mean the infection is in your kidneys. Other symptoms of a kidney infection include pain in your back or sides below the ribs, nausea, and vomiting. DIAGNOSIS To diagnose a UTI, your caregiver will ask you about your symptoms. Your caregiver also will ask to provide a urine sample. The urine sample will be tested for bacteria and white blood cells. White blood cells are made by your body to help fight infection. TREATMENT  Typically, UTIs can be treated with medication. Because most UTIs are caused by a bacterial infection, they usually can be treated with the use of antibiotics. The choice of antibiotic and length of treatment depend on your symptoms and the type of bacteria causing your infection. HOME CARE INSTRUCTIONS  If you were prescribed antibiotics, take them exactly as your caregiver instructs you. Finish the medication even if you feel better after you have only taken some of the medication.  Drink enough water and fluids to keep your urine clear or pale yellow.  Avoid caffeine, tea, and carbonated beverages. They tend to irritate your bladder.  Empty your bladder often. Avoid holding urine for long periods of time.  Empty your bladder before and after sexual intercourse.  After a bowel movement, women should cleanse from front to back. Use each tissue only once. SEEK MEDICAL CARE IF:   You have back pain.  You develop a fever.  Your symptoms do not begin to resolve within 3 days. SEEK IMMEDIATE MEDICAL CARE IF:   You have severe back pain or lower abdominal pain.  You develop chills.  You have nausea or vomiting.  You have continued burning or discomfort with urination. MAKE SURE YOU:   Understand these instructions.  Will watch your condition.  Will get help right away if you are not doing well or get worse. Document Released: 06/03/2005 Document Revised: 02/23/2012 Document Reviewed:  10/02/2011 [facility] Patient Information 2015 [place], [place]. This information is not intended to replace advice given to you by your health care provider. Make sure you discuss any questions you have with your health care provider.
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
## 2015-06-13 ENCOUNTER — Encounter (HOSPITAL_COMMUNITY): Payer: Self-pay | Admitting: Emergency Medicine

## 2015-06-13 ENCOUNTER — Emergency Department (HOSPITAL_COMMUNITY)
Admission: EM | Admit: 2015-06-13 | Discharge: 2015-06-13 | Disposition: A | Discharge status: Home / Self Care | Payer: Self-pay | Attending: Emergency Medicine | Admitting: Emergency Medicine

## 2015-06-13 DIAGNOSIS — N72 Inflammatory disease of cervix uteri: Secondary | ICD-10-CM | POA: Insufficient documentation

## 2015-06-13 DIAGNOSIS — Z79899 Other long term (current) drug therapy: Secondary | ICD-10-CM | POA: Insufficient documentation

## 2015-06-13 DIAGNOSIS — R197 Diarrhea, unspecified: Secondary | ICD-10-CM | POA: Insufficient documentation

## 2015-06-13 DIAGNOSIS — Z3202 Encounter for pregnancy test, result negative: Secondary | ICD-10-CM | POA: Insufficient documentation

## 2015-06-13 DIAGNOSIS — J45909 Unspecified asthma, uncomplicated: Secondary | ICD-10-CM | POA: Insufficient documentation

## 2015-06-13 LAB — WET PREP, GENITAL
TRICH WET PREP: NONE SEEN
YEAST WET PREP: NONE SEEN

## 2015-06-13 LAB — URINALYSIS, ROUTINE W REFLEX MICROSCOPIC
BILIRUBIN URINE: NEGATIVE
Glucose, UA: NEGATIVE mg/dL
Ketones, ur: NEGATIVE mg/dL
NITRITE: NEGATIVE
PROTEIN: NEGATIVE mg/dL
Specific Gravity, Urine: 1.017 (ref 1.005–1.030)
UROBILINOGEN UA: 0.2 mg/dL (ref 0.0–1.0)
pH: 6 (ref 5.0–8.0)

## 2015-06-13 LAB — URINE MICROSCOPIC-ADD ON

## 2015-06-13 LAB — HIV ANTIBODY (ROUTINE TESTING W REFLEX): HIV SCREEN 4TH GENERATION: NONREACTIVE

## 2015-06-13 LAB — PREGNANCY, URINE: PREG TEST UR: NEGATIVE

## 2015-06-13 MED ORDER — AZITHROMYCIN 250 MG PO TABS
1000.0000 mg | ORAL_TABLET | Freq: Once | ORAL | Status: AC
  Administered 2015-06-13: 1000 mg via ORAL
  Filled 2015-06-13: qty 4

## 2015-06-13 MED ORDER — CEFTRIAXONE SODIUM 250 MG IJ SOLR
250.0000 mg | Freq: Once | INTRAMUSCULAR | Status: AC
  Administered 2015-06-13: 250 mg via INTRAMUSCULAR
  Filled 2015-06-13: qty 250

## 2015-06-13 MED ORDER — DICYCLOMINE HCL 20 MG PO TABS: 20.0000 mg | ORAL_TABLET | Freq: Two times a day (BID) | ORAL | Status: DC

## 2015-06-13 MED ORDER — LIDOCAINE HCL (PF) 1 % IJ SOLN
0.9000 mL | Freq: Once | INTRAMUSCULAR | Status: AC
  Administered 2015-06-13: 0.9 mL
  Filled 2015-06-13: qty 5

## 2015-06-13 NOTE — ED Provider Notes (Signed)
CSN: [id number]     Arrival date & time 06/13/15  0704 History   First MD Initiated Contact with Patient 06/13/15 [phone]     Chief Complaint  Patient presents with  . Abdominal Pain     (Consider location/radiation/quality/duration/timing/severity/associated sxs/prior Treatment) HPI  Pt presenting with lower abdominal pain and cramping.  She has been having diarrhea for the past 3 days.  C/o pain in lower abdomen in both sides and middle of abdominal.  No fever/chills.  No vomiting.  Has continued to eat and drink normally.  Denies dysuria.  No vaginal bleeding or vaginal discharge.  No blood in stool.  No recent travel.  No sick contacts.  Immunizations are up to date.  Has not had a change in diet.  She has not had any treatment prior to arrival.  There are no other associated systemic symptoms, there are no other alleviating or modifying factors.   Past Medical History  Diagnosis Date  . Asthma    History reviewed. No pertinent past surgical history. Family History  Problem Relation Age of Onset  . Hypertension Mother   . Cancer Mother   . Stroke Mother    Social History  Substance Use Topics  . Smoking status: Never Smoker   . Smokeless tobacco: None  . Alcohol Use: No     Comment: occasional   OB History    Gravida Para Term Preterm AB TAB SAB Ectopic Multiple Living       Review of Systems  ROS reviewed and all otherwise negative except for mentioned in HPI    Allergies  Review of patient's allergies indicates no known allergies.  Home Medications   Prior to Admission medications   Medication Sig Start Date End Date Taking? Authorizing Provider  albuterol (PROVENTIL HFA;VENTOLIN HFA) 108 (90 BASE) MCG/ACT inhaler Inhale 2 puffs into the lungs every 6 (six) hours as needed for wheezing or shortness of breath.   Yes Historical Provider, MD  amoxicillin (AMOXIL) 500 MG capsule Take 1 capsule (500 mg total) by mouth 3 (three) times daily. Patient  not taking: Reported on 06/13/2015 06/24/14   Raeford Razor, MD  cephALEXin (KEFLEX) 500 MG capsule Take 1 capsule (500 mg total) by mouth 3 (three) times daily. Patient not taking: Reported on 06/13/2015 03/05/15   Tatyana Kirichenko, PA-C  cetirizine-pseudoephedrine (ZYRTEC-D) 5-120 MG per tablet Take 1 tablet by mouth daily. Patient not taking: Reported on 06/13/2015 11/22/14   Teressa Lower, NP  cyclobenzaprine (FLEXERIL) 10 MG tablet Take 1 tablet (10 mg total) by mouth 2 (two) times daily as needed for muscle spasms. Patient not taking: Reported on 06/13/2015 03/05/15   Tatyana Kirichenko, PA-C  dicyclomine (BENTYL) 20 MG tablet Take 1 tablet (20 mg total) by mouth 2 (two) times daily. 06/13/15   Jerelyn Scott, MD  guaiFENesin-codeine 100-10 MG/5ML syrup Take 5-10 mLs by mouth 3 (three) times daily as needed for cough. Patient not taking: Reported on 06/13/2015 06/24/14   Raeford Razor, MD  naproxen (NAPROSYN) 500 MG tablet Take 1 tablet (500 mg total) by mouth 2 (two) times daily. Patient not taking: Reported on 06/13/2015 03/05/15   Tatyana Kirichenko, PA-C   BP 109/66 mmHg  Pulse 56  Temp(Src) 98.2 F (36.8 C) (Oral)  Resp 15  Ht  (1.626 m)  Wt 157 lb (71.215 kg)  BMI 26.94 kg/m2  SpO2 97%  LMP 06/04/2015 (Exact Date)  Vitals reviewed Physical Exam  Physical Examination: General appearance - alert, well appearing, and in no distress Mental status - alert, oriented to person, place, and time Eyes - no conjunctival injection, no scleral icterus Mouth - mucous membranes moist, pharynx normal without lesions Chest - clear to auscultation, no wheezes, rales or rhonchi, symmetric air entry Heart - normal rate, regular rhythm, normal S1, S2, no murmurs, rubs, clicks or gallops Abdomen - soft, mild tenderness to palpation in bilateral lower quadrants and suprapubic region, no gaurding or rebound tenderness, nabs, nondistended, no masses or organomegaly Pelvic - normal external  genitalia, vulva, vagina, cervix, uterus and adnexa, no CMT, no adnexal tenderness Neurological - alert Extremities - peripheral pulses normal, no pedal edema, no clubbing or cyanosis Skin - normal coloration and turgor, no rashes  ED Course  Procedures (including critical care time) Labs Review Labs Reviewed  WET PREP, GENITAL - Abnormal; Notable for the following:    Clue Cells Wet Prep HPF POC FEW (*)    WBC, Wet Prep HPF POC MODERATE (*)    All other components within normal limits  URINALYSIS, ROUTINE W REFLEX MICROSCOPIC (NOT AT [facility]) - Abnormal; Notable for the following:    APPearance TURBID (*)    Hgb urine dipstick SMALL (*)    Leukocytes, UA MODERATE (*)    All other components within normal limits  URINE MICROSCOPIC-ADD ON - Abnormal; Notable for the following:    Squamous Epithelial / LPF MANY (*)    Bacteria, UA MANY (*)    All other components within normal limits  URINE CULTURE  PREGNANCY, URINE  HIV ANTIBODY (ROUTINE TESTING)  GC/CHLAMYDIA PROBE AMP () NOT AT [facility]    Imaging Review No results found. I have personally reviewed and evaluated these images and lab results as part of my medical decision-making.   EKG Interpretation None      MDM   Final diagnoses:  Diarrhea, unspecified type  Cervicitis    Pt presenting with diarrhea for the past several days, lower abdominal pain and cramping.  Pelvic does not reveal any CMT or adnexal tenderness, doubt PID.  Will treat for cervicitis based on WBCs on wet prep.  Given bentyl to help with cramping.  Discharged with strict return precautions.  Pt agreeable with plan.    Jerelyn Scott, MD 06/13/15 1040

## 2015-06-13 NOTE — Discharge Instructions (Signed)
Return to the ED with any concerns including fever, vomiting, blood in stools, worsening abdominal pain, decreased level of alertness/lethargy, or any other alarming symptoms

## 2015-06-13 NOTE — ED Notes (Signed)
Pt reports diarrhea and lower abdominal pain x 1 week. Pt denies fever, vomiting or urinary symptoms.

## 2015-06-14 LAB — URINE CULTURE

## 2015-06-14 LAB — GC/CHLAMYDIA PROBE AMP (~~LOC~~) NOT AT ARMC
CHLAMYDIA, DNA PROBE: NEGATIVE
Neisseria Gonorrhea: NEGATIVE

## 2015-08-26 ENCOUNTER — Emergency Department (HOSPITAL_COMMUNITY)
Admission: EM | Admit: 2015-08-26 | Discharge: 2015-08-26 | Disposition: A | Discharge status: Home / Self Care | Payer: Self-pay | Attending: Emergency Medicine | Admitting: Emergency Medicine

## 2015-08-26 ENCOUNTER — Encounter (HOSPITAL_COMMUNITY): Payer: Self-pay | Admitting: Family Medicine

## 2015-08-26 DIAGNOSIS — K0889 Other specified disorders of teeth and supporting structures: Secondary | ICD-10-CM | POA: Insufficient documentation

## 2015-08-26 DIAGNOSIS — J45909 Unspecified asthma, uncomplicated: Secondary | ICD-10-CM | POA: Insufficient documentation

## 2015-08-26 DIAGNOSIS — Z79899 Other long term (current) drug therapy: Secondary | ICD-10-CM | POA: Insufficient documentation

## 2015-08-26 MED ORDER — PENICILLIN V POTASSIUM 500 MG PO TABS: 500.0000 mg | ORAL_TABLET | Freq: Four times a day (QID) | ORAL | Status: AC

## 2015-08-26 MED ORDER — IBUPROFEN 800 MG PO TABS: 800.0000 mg | ORAL_TABLET | Freq: Three times a day (TID) | ORAL | Status: DC | PRN

## 2015-08-26 MED ORDER — ACETAMINOPHEN-CODEINE #3 300-30 MG PO TABS
1.0000 | ORAL_TABLET | Freq: Four times a day (QID) | ORAL | Status: DC | PRN
Start: 2015-08-26 — End: 2016-02-26

## 2015-08-26 MED ORDER — OXYCODONE-ACETAMINOPHEN 5-325 MG PO TABS
1.0000 | ORAL_TABLET | Freq: Once | ORAL | Status: AC
  Administered 2015-08-26: 1 via ORAL

## 2015-08-26 MED ORDER — OXYCODONE-ACETAMINOPHEN 5-325 MG PO TABS
ORAL_TABLET | ORAL | Status: AC
  Filled 2015-08-26: qty 1

## 2015-08-26 NOTE — ED Notes (Signed)
Pt here for left sided dental pain. sts she is suppose to have a root canal done and dentists cant see her till Thursday.

## 2015-08-26 NOTE — ED Provider Notes (Signed)
CSN: [id number]     Arrival date & time 08/26/15  1449 History  By signing my name below, I, Placido SouLogan Joldersma, attest that this documentation has been prepared under the direction and in the presence of [facility] Campus[facility]ily Chelsey Redondo, PA-C. Electronically Signed: Placido SouLogan Joldersma, ED Scribe. 08/26/2015. 4:11 PM.   Chief Complaint  Patient presents with  . Dental Pain   The history is provided by the patient. No language interpreter was used.    HPI Comments: Emily Callahan is a 19 y.o. female who presents to the Emergency Department complaining of worsening, moderate, left lower dental pain with onset yesterday. Pt notes some mild facial swelling which has since subsided and a constant, moderate, HA. She notes taking ibuprofen and Orajel and denies relief with either. Pt notes worsening pain yesterday.  Has a root canal scheduled in 3 days and was told to come to the ED by her provider for short term pain management. She has no known drug allergies. Pt denies fevers, chills, sore throat, difficulty swallowing and SOB.   Dental Provider: Dr. Excell Seltzerooper [facility])  Past Medical History  Diagnosis Date  . Asthma    History reviewed. No pertinent past surgical history. Family History  Problem Relation Age of Onset  . Hypertension Mother   . Cancer Mother   . Stroke Mother    Social History  Substance Use Topics  . Smoking status: Never Smoker   . Smokeless tobacco: None  . Alcohol Use: No     Comment: occasional   OB History    Gravida Para Term Preterm AB TAB SAB Ectopic Multiple Living   0 0 0 0 0 0 0 0 0 0      Review of Systems  Constitutional: Negative for fever and chills.  HENT: Positive for dental problem and facial swelling. Negative for sore throat and trouble swallowing.   Respiratory: Negative for shortness of breath.   Musculoskeletal: Negative for myalgias.  Skin: Negative for color change.  Allergic/Immunologic: Negative for immunocompromised state.  Neurological: Positive for  headaches.  Hematological: Does not bruise/bleed easily.  Psychiatric/Behavioral: Negative for self-injury.   Allergies  Review of patient's allergies indicates no known allergies.  Home Medications   Prior to Admission medications   Medication Sig Start Date End Date Taking? Authorizing Provider  albuterol (PROVENTIL HFA;VENTOLIN HFA) 108 (90 BASE) MCG/ACT inhaler Inhale 2 puffs into the lungs every 6 (six) hours as needed for wheezing or shortness of breath.    Historical Provider, MD  amoxicillin (AMOXIL) 500 MG capsule Take 1 capsule (500 mg total) by mouth 3 (three) times daily. Patient not taking: Reported on 06/13/2015 06/24/14   Raeford RazorStephen Kohut, MD  cephALEXin (KEFLEX) 500 MG capsule Take 1 capsule (500 mg total) by mouth 3 (three) times daily. Patient not taking: Reported on 06/13/2015 03/05/15   Tatyana Kirichenko, PA-C  cetirizine-pseudoephedrine (ZYRTEC-D) 5-120 MG per tablet Take 1 tablet by mouth daily. Patient not taking: Reported on 06/13/2015 11/22/14   Teressa LowerVrinda Pickering, NP  cyclobenzaprine (FLEXERIL) 10 MG tablet Take 1 tablet (10 mg total) by mouth 2 (two) times daily as needed for muscle spasms. Patient not taking: Reported on 06/13/2015 03/05/15   Tatyana Kirichenko, PA-C  dicyclomine (BENTYL) 20 MG tablet Take 1 tablet (20 mg total) by mouth 2 (two) times daily. 06/13/15   Jerelyn ScottMartha Linker, MD  guaiFENesin-codeine 100-10 MG/5ML syrup Take 5-10 mLs by mouth 3 (three) times daily as needed for cough. Patient not taking: Reported on 06/13/2015 06/24/14   Raeford RazorStephen Kohut,  MD  naproxen (NAPROSYN) 500 MG tablet Take 1 tablet (500 mg total) by mouth 2 (two) times daily. Patient not taking: Reported on 06/13/2015 03/05/15   Tatyana Kirichenko, PA-C   BP 121/71 mmHg  Pulse 79  Temp(Src) 98.3 F (36.8 C) (Oral)  Resp 18  SpO2 98%  LMP 07/23/2015 Physical Exam  Constitutional: She appears well-developed and well-nourished. No distress.  HENT:  Head: Normocephalic and atraumatic.   Mouth/Throat: Uvula is midline and oropharynx is clear and moist. Mucous membranes are not dry. No uvula swelling. No oropharyngeal exudate, posterior oropharyngeal edema, posterior oropharyngeal erythema or tonsillar abscesses.  Tenderness to percussion of 2nd and 3rd left lower molars   Neck: Trachea normal, normal range of motion and phonation normal. Neck supple. No tracheal tenderness present. No rigidity. No tracheal deviation, no edema, no erythema and normal range of motion present.  Cardiovascular: Normal rate.   Pulmonary/Chest: Effort normal and breath sounds normal. No stridor.  Lymphadenopathy:    She has no cervical adenopathy.  Neurological: She is alert.  Skin: She is not diaphoretic.  Nursing note and vitals reviewed.  ED Course  Dental Date/Time: 08/26/2015 4:28 PM Performed by: Trixie Dredge Authorized by: Trixie Dredge Consent: Verbal consent obtained. Consent given by: patient Local anesthesia used: yes Anesthesia: nerve block Local anesthetic: bupivacaine 0.5% with epinephrine Patient sedated: no Patient tolerance: Patient tolerated the procedure well with no immediate complications Comments: Left lower inferior alveolar block     DIAGNOSTIC STUDIES: Oxygen Saturation is 98% on RA, normal by my interpretation.    COORDINATION OF CARE: 4:09 PM Pt presents today due to left lower dental pain. Discussed next steps with pt and she agreed to the plan.   Labs Review Labs Reviewed - No data to display  Imaging Review No results found.   EKG Interpretation None      MDM   Final diagnoses:  Pain, dental     Afebrile, nontoxic patient with worsening dental pain. No obvious abscess but reported facial swelling yesterday.  No concerning findings on exam. Doubt deep space head or neck infection.  Doubt Ludwig's angina.  Pt has root canal scheduled for 3 days from now.  D/C home with antibiotic, pain medication and dental follow up.  Discussed findings,  treatment, and follow up  with patient.  Pt given return precautions.  Pt verbalizes understanding and agrees with plan.       I personally performed the services described in this documentation, which was scribed in my presence. The recorded information has been reviewed and is accurate.    Trixie Dredge, PA-C 08/26/15 1649  Arby Barrette, MD 09/04/15 [phone]

## 2015-08-26 NOTE — Discharge Instructions (Signed)
Read the information below.  Use the prescribed medication as directed.  Please discuss all new medications with your pharmacist.  You may return to the Emergency Department at any time for worsening condition or any new symptoms that concern you.  Please follow up with your dentist as planned.  If you develop fevers, swelling in your face, difficulty swallowing or breathing, return to the ER immediately for a recheck.

## 2015-11-01 ENCOUNTER — Encounter (HOSPITAL_COMMUNITY): Payer: Self-pay | Admitting: *Deleted

## 2015-11-01 ENCOUNTER — Emergency Department (HOSPITAL_COMMUNITY)
Admission: EM | Admit: 2015-11-01 | Discharge: 2015-11-01 | Disposition: A | Discharge status: Home / Self Care | Payer: BLUE CROSS/BLUE SHIELD | Attending: Emergency Medicine | Admitting: Emergency Medicine

## 2015-11-01 ENCOUNTER — Emergency Department (HOSPITAL_COMMUNITY): Payer: BLUE CROSS/BLUE SHIELD

## 2015-11-01 DIAGNOSIS — M545 Low back pain, unspecified: Secondary | ICD-10-CM

## 2015-11-01 DIAGNOSIS — Z79899 Other long term (current) drug therapy: Secondary | ICD-10-CM | POA: Diagnosis not present

## 2015-11-01 DIAGNOSIS — J45909 Unspecified asthma, uncomplicated: Secondary | ICD-10-CM | POA: Insufficient documentation

## 2015-11-01 DIAGNOSIS — J029 Acute pharyngitis, unspecified: Secondary | ICD-10-CM | POA: Diagnosis not present

## 2015-11-01 LAB — URINE MICROSCOPIC-ADD ON: Bacteria, UA: NONE SEEN

## 2015-11-01 LAB — URINALYSIS, ROUTINE W REFLEX MICROSCOPIC
BILIRUBIN URINE: NEGATIVE
GLUCOSE, UA: NEGATIVE mg/dL
KETONES UR: NEGATIVE mg/dL
Nitrite: NEGATIVE
PH: 7.5 (ref 5.0–8.0)
Protein, ur: NEGATIVE mg/dL
Specific Gravity, Urine: 1.012 (ref 1.005–1.030)

## 2015-11-01 LAB — RAPID STREP SCREEN (MED CTR MEBANE ONLY): Streptococcus, Group A Screen (Direct): NEGATIVE

## 2015-11-01 MED ORDER — CYCLOBENZAPRINE HCL 10 MG PO TABS: 10.0000 mg | ORAL_TABLET | Freq: Two times a day (BID) | ORAL | Status: DC | PRN

## 2015-11-01 MED ORDER — DEXAMETHASONE SODIUM PHOSPHATE 10 MG/ML IJ SOLN
10.0000 mg | Freq: Once | INTRAMUSCULAR | Status: AC
  Administered 2015-11-01: 10 mg via INTRAMUSCULAR
  Filled 2015-11-01: qty 1

## 2015-11-01 MED ORDER — LIDOCAINE VISCOUS 2 % MT SOLN
15.0000 mL | Freq: Once | OROMUCOSAL | Status: AC
  Administered 2015-11-01: 15 mL via OROMUCOSAL
  Filled 2015-11-01: qty 15

## 2015-11-01 MED ORDER — IBUPROFEN 400 MG PO TABS
800.0000 mg | ORAL_TABLET | Freq: Once | ORAL | Status: AC
  Administered 2015-11-01: 800 mg via ORAL
  Filled 2015-11-01: qty 2

## 2015-11-01 MED ORDER — IBUPROFEN 800 MG PO TABS: 800.0000 mg | ORAL_TABLET | Freq: Three times a day (TID) | ORAL | Status: DC

## 2015-11-01 MED ORDER — LIDOCAINE VISCOUS 2 % MT SOLN: 20.0000 mL | OROMUCOSAL | Status: DC | PRN

## 2015-11-01 NOTE — ED Provider Notes (Signed)
CSN: [id number]     Arrival date & time 11/01/15  0700 History  By signing my name below, I, Essence Howell, attest that this documentation has been prepared under the direction and in the presence of Cheri Fowler, PA-C Electronically Signed: Charline Bills, ED Scribe 11/01/2015 at 11:08 AM.   Chief Complaint  Patient presents with  . Back Pain  . Sore Throat   The history is provided by the patient. No language interpreter was used.   HPI Comments: Emily Callahan is a 20 y.o. female who presents to the Emergency Department complaining of persistent sore throat for the past 2 days. Pt reports increased pain with swallowing. She reports associated rhinorrhea and dry cough. Pt has tried Theraflu with temporary relief. She denies fever, ear pain, nausea, vomiting, abdominal pain.   Pt also presents with gradually worsening back pain over the past 4 days. She reports a h/o back pain but states that spasms have become more frequent. Back pain is exacerbated with palpation. She denies recent fall or injury. Pt also denies bladder/bowel incontinence, dysuria, hematuria, or vaginal complaints. No h/o CA or IV drug use.  Past Medical History  Diagnosis Date  . Asthma    History reviewed. No pertinent past surgical history. Family History  Problem Relation Age of Onset  . Hypertension Mother   . Cancer Mother   . Stroke Mother    Social History  Substance Use Topics  . Smoking status: Never Smoker   . Smokeless tobacco: None  . Alcohol Use: No     Comment: occasional   OB History    Gravida Para Term Preterm AB TAB SAB Ectopic Multiple Living       Review of Systems  Constitutional: Negative for fever.  HENT: Positive for rhinorrhea and sore throat. Negative for ear pain.   Respiratory: Positive for cough.   Gastrointestinal: Negative for nausea, vomiting and abdominal pain.  Genitourinary: Negative for dysuria and hematuria.  Musculoskeletal: Positive for back  pain.  All other systems reviewed and are negative.  Allergies  Review of patient's allergies indicates no known allergies.  Home Medications   Prior to Admission medications   Medication Sig Start Date End Date Taking? Authorizing Provider  acetaminophen-codeine (TYLENOL #3) 300-30 MG tablet Take 1-2 tablets by mouth every 6 (six) hours as needed for moderate pain. 08/26/15   Trixie Dredge, PA-C  albuterol (PROVENTIL HFA;VENTOLIN HFA) 108 (90 BASE) MCG/ACT inhaler Inhale 2 puffs into the lungs every 6 (six) hours as needed for wheezing or shortness of breath.    Historical Provider, MD  amoxicillin (AMOXIL) 500 MG capsule Take 1 capsule (500 mg total) by mouth 3 (three) times daily. Patient not taking: Reported on 06/13/2015 06/24/14   Raeford Razor, MD  cephALEXin (KEFLEX) 500 MG capsule Take 1 capsule (500 mg total) by mouth 3 (three) times daily. Patient not taking: Reported on 06/13/2015 03/05/15   Tatyana Kirichenko, PA-C  cetirizine-pseudoephedrine (ZYRTEC-D) 5-120 MG per tablet Take 1 tablet by mouth daily. Patient not taking: Reported on 06/13/2015 11/22/14   Teressa Lower, NP  cyclobenzaprine (FLEXERIL) 10 MG tablet Take 1 tablet (10 mg total) by mouth 2 (two) times daily as needed for muscle spasms. Patient not taking: Reported on 06/13/2015 03/05/15   Tatyana Kirichenko, PA-C  dicyclomine (BENTYL) 20 MG tablet Take 1 tablet (20 mg total) by mouth 2 (two) times daily. 06/13/15   Jerelyn Scott, MD  guaiFENesin-codeine 100-10 MG/5ML  syrup Take 5-10 mLs by mouth 3 (three) times daily as needed for cough. Patient not taking: Reported on 06/13/2015 06/24/14   Raeford Razor, MD  ibuprofen (ADVIL,MOTRIN) 800 MG tablet Take 1 tablet (800 mg total) by mouth every 8 (eight) hours as needed for mild pain or moderate pain. 08/26/15   Trixie Dredge, PA-C  naproxen (NAPROSYN) 500 MG tablet Take 1 tablet (500 mg total) by mouth 2 (two) times daily. Patient not taking: Reported on 06/13/2015 03/05/15   Tatyana  Kirichenko, PA-C   BP 121/82 mmHg  Pulse 79  Temp(Src) 98.2 F (36.8 C) (Oral)  Resp 18  SpO2 98%  LMP 10/25/2015 Physical Exam  Constitutional: She is oriented to person, place, and time. She appears well-developed and well-nourished.  HENT:  Head: Atraumatic.  Right Ear: Tympanic membrane normal.  Left Ear: Tympanic membrane normal.  Nose: Nose normal.  Mouth/Throat: Uvula is midline and mucous membranes are normal. No trismus in the jaw. No uvula swelling. Posterior oropharyngeal edema and posterior oropharyngeal erythema present. No oropharyngeal exudate or tonsillar abscesses.  Eyes: Conjunctivae are normal.  Cardiovascular: Normal rate, regular rhythm, normal heart sounds and intact distal pulses.   Pulses:      Dorsalis pedis pulses are 2+ on the right side, and 2+ on the left side.  Pulmonary/Chest: Effort normal and breath sounds normal.  Abdominal: Soft. Bowel sounds are normal. She exhibits no distension. There is no tenderness. There is no rebound and no guarding.  Musculoskeletal: She exhibits tenderness.  Lumbar spine TTP along with lumbar musculature.  No step offs. No crepitus.  Neurological: She is alert and oriented to person, place, and time.  No saddle anesthesia. Strength and sensation intact bilaterally throughout lower extremities.  Gait normal.   Skin: Skin is warm and dry.  Psychiatric: She has a normal mood and affect. Her behavior is normal.   ED Course  Procedures (including critical care time) DIAGNOSTIC STUDIES: Oxygen Saturation is 98% on RA, normal by my interpretation.    COORDINATION OF CARE: 9:21 AM-Discussed treatment plan which includes strep screen, UA, XR, ibuprofen and Decadron injection with pt at bedside and pt agreed to plan.   Labs Review Labs Reviewed  URINALYSIS, ROUTINE W REFLEX MICROSCOPIC (NOT AT [facility]) - Abnormal; Notable for the following:    Hgb urine dipstick TRACE (*)    Leukocytes, UA TRACE (*)    All other components  within normal limits  URINE MICROSCOPIC-ADD ON - Abnormal; Notable for the following:    Squamous Epithelial / LPF 6-30 (*)    All other components within normal limits  RAPID STREP SCREEN (NOT AT [facility])  CULTURE, GROUP A STREP [facility])   Imaging Review Dg Lumbar Spine Complete  11/01/2015  CLINICAL DATA:  Chronic lumbago.  No history of trauma EXAM: LUMBAR SPINE - COMPLETE 4+ VIEW COMPARISON:  March 05, 2015 FINDINGS: Frontal, lateral, spot lumbosacral lateral, and bilateral oblique views were obtained. There are 4 strictly non-rib-bearing lumbar type vertebral bodies. The L5 vertebra is transitional with an assimilation joint on the right. There is no fracture or spondylolisthesis. The disc spaces appear normal. There is no appreciable facet arthropathy. IMPRESSION: No fracture or spondylolisthesis.  No appreciable arthropathy. Electronically Signed   By: Bretta Bang III M.D.   On: 11/01/2015 09:41   I have personally reviewed and evaluated these images and lab results as part of my medical decision-making.   EKG Interpretation None      MDM   Final diagnoses:  Pharyngitis  Bilateral low back pain without sciatica    Patient presents with symptoms consistent with pharyngitis and low back pain/muscle spasms.  VSS, NAD.  On exam, postoropharyngeal edema and erythema.  Lumbar spine TTP along with lumbar musculature.  Gait normal.  No red flags.  Patient given Decadron, viscous lidocaine, and ibuprofen in ED.  Plain films negative.  UA negative.  Rapid strep negative.  Plan to discharge home with ibuprofen, flexeril, and viscous lidocaine.  Follow up PCP.  Discussed return precautions.  Patient agrees and acknowledges the above plan for discharge.   I personally performed the services described in this documentation, which was scribed in my presence. The recorded information has been reviewed and is accurate.    Cheri Fowler, PA-C 11/01/15 1111  Cheri Fowler, PA-C 11/01/15  1114  Pricilla Loveless, MD 11/02/15 1100

## 2015-11-01 NOTE — ED Notes (Addendum)
Called to triage pt, no response. 

## 2015-11-01 NOTE — ED Notes (Signed)
Pt reports lower back pain for several days, denies any urinary symptoms and now also having sore throat. Denies fever. Airway intact.

## 2015-11-01 NOTE — Discharge Instructions (Signed)
Pharyngitis Pharyngitis is redness, pain, and swelling (inflammation) of your pharynx.  CAUSES  Pharyngitis is usually caused by infection. Most of the time, these infections are from viruses (viral) and are part of a cold. However, sometimes pharyngitis is caused by bacteria (bacterial). Pharyngitis can also be caused by allergies. Viral pharyngitis may be spread from person to person by coughing, sneezing, and personal items or utensils (cups, forks, spoons, toothbrushes). Bacterial pharyngitis may be spread from person to person by more intimate contact, such as kissing.  SIGNS AND SYMPTOMS  Symptoms of pharyngitis include:   Sore throat.   Tiredness (fatigue).   Low-grade fever.   Headache.  Joint pain and muscle aches.  Skin rashes.  Swollen lymph nodes.  Plaque-like film on throat or tonsils (often seen with bacterial pharyngitis). DIAGNOSIS  Your health care provider will ask you questions about your illness and your symptoms. Your medical history, along with a physical exam, is often all that is needed to diagnose pharyngitis. Sometimes, a rapid strep test is done. Other lab tests may also be done, depending on the suspected cause.  TREATMENT  Viral pharyngitis will usually get better in 3-4 days without the use of medicine. Bacterial pharyngitis is treated with medicines that kill germs (antibiotics).  HOME CARE INSTRUCTIONS   Drink enough water and fluids to keep your urine clear or pale yellow.   Only take over-the-counter or prescription medicines as directed by your health care provider:   If you are prescribed antibiotics, make sure you finish them even if you start to feel better.   Do not take aspirin.   Get lots of rest.   Gargle with 8 oz of salt water ( tsp of salt per 1 qt of water) as often as every 1-2 hours to soothe your throat.   Throat lozenges (if you are not at risk for choking) or sprays may be used to soothe your throat. SEEK MEDICAL  CARE IF:   You have large, tender lumps in your neck.  You have a rash.  You cough up green, yellow-brown, or bloody spit. SEEK IMMEDIATE MEDICAL CARE IF:   Your neck becomes stiff.  You drool or are unable to swallow liquids.  You vomit or are unable to keep medicines or liquids down.  You have severe pain that does not go away with the use of recommended medicines.  You have trouble breathing (not caused by a stuffy nose). MAKE SURE YOU:   Understand these instructions.  Will watch your condition.  Will get help right away if you are not doing well or get worse.   This information is not intended to replace advice given to you by your health care provider. Make sure you discuss any questions you have with your health care provider.   Document Released: 08/24/2005 Document Revised: 06/14/2013 Document Reviewed: 05/01/2013 Elsevier Interactive Patient Education 2016 [employer] Inc[employer]  Muscle Cramps and Spasms   Muscle cramps and spasms occur when a muscle or muscles tighten and you have no control over this tightening (involuntary muscle contraction). They are a common problem and can develop in any muscle. The most common place is in the calf muscles of the leg. Both muscle cramps and muscle spasms are involuntary muscle contractions, but they also have differences:  Muscle cramps are sporadic and painful. They may last a few seconds to a quarter of an hour. Muscle cramps are often more forceful and last longer than muscle spasms.  Muscle spasms may or may not  be painful. They may also last just a few seconds or much longer. CAUSES  It is uncommon for cramps or spasms to be due to a serious underlying problem. In many cases, the cause of cramps or spasms is unknown. Some common causes are:  Overexertion.  Overuse from repetitive motions (doing the same thing over and over).  Remaining in a certain position for a long period of time.  Improper preparation, form, or technique  while performing a sport or activity.  Dehydration.  Injury.  Side effects of some medicines.  Abnormally low levels of the salts and ions in your blood (electrolytes), especially potassium and calcium. This could happen if you are taking water pills (diuretics) or you are pregnant.  Some underlying medical problems can make it more likely to develop cramps or spasms. These include, but are not limited to:  Diabetes.  Parkinson disease.  Hormone disorders, such as thyroid problems.  Alcohol abuse.  Diseases specific to muscles, joints, and bones.  Blood vessel disease where not enough blood is getting to the muscles.  HOME CARE INSTRUCTIONS  Stay well hydrated. Drink enough water and fluids to keep your urine clear or pale yellow.  It may be helpful to massage, stretch, and relax the affected muscle.  For tight or tense muscles, use a warm towel, heating pad, or hot shower water directed to the affected area.  If you are sore or have pain after a cramp or spasm, applying ice to the affected area may relieve discomfort.  Put ice in a plastic bag.  Place a towel between your skin and the bag.  Leave the ice on for 15-20 minutes, 03-04 times a day. Medicines used to treat a known cause of cramps or spasms may help reduce their frequency or severity. Only take over-the-counter or prescription medicines as directed by your caregiver. SEEK MEDICAL CARE IF:  Your cramps or spasms get more severe, more frequent, or do not improve over time.  MAKE SURE YOU:  Understand these instructions.  Will watch your condition.  Will get help right away if you are not doing well or get worse. This information is not intended to replace advice given to you by your health care provider. Make sure you discuss any questions you have with your health care provider.  Document Released: 02/13/2002 Document Revised: 12/19/2012 Document Reviewed: 08/10/2012  Elsevier Interactive Patient Education [employer]

## 2015-11-02 LAB — CULTURE, GROUP A STREP (THRC)

## 2015-11-03 ENCOUNTER — Telehealth (HOSPITAL_COMMUNITY): Payer: Self-pay

## 2015-11-03 NOTE — Progress Notes (Signed)
ED Antimicrobial Stewardship Positive Culture Follow Up   Emily Callahan is an 20 y.o. female who presented to [facility] on 11/01/2015 with a chief complaint of  Chief Complaint  Patient presents with  . Back Pain  . Sore Throat    Recent Results (from the past 720 hour(s))  Rapid strep screen     Status: None   Collection Time: 11/01/15  9:02 AM  Result Value Ref Range Status   Streptococcus, Group A Screen (Direct) NEGATIVE NEGATIVE Final    Comment: (NOTE) A Rapid Antigen test may result negative if the antigen level in the sample is below the detection level of this test. The FDA has not cleared this test as a stand-alone test therefore the rapid antigen negative result has reflexed to a Group A Strep culture.   Culture, group A strep     Status: None   Collection Time: 11/01/15  9:02 AM  Result Value Ref Range Status   Specimen Description THROAT  Final   Special Requests NONE Reflexed from [id number]  Final   Culture ABUNDANT STREPTOCOCCUS,BETA HEMOLYTIC NOT GROUP A  Final   Report Status 11/02/2015 FINAL  Final     Treated with , organism resistant to prescribed antimicrobial  Patient discharged originally without antimicrobial agent and treatment is now indicated  New antibiotic prescription: If strep symptoms, amoxicillin  PO BID x 10 days  ED Provider: Cheri Fowler, PA   Amarria Andreasen, Drake Leach 11/03/2015, 10:22 AM Clinical Pharmacist Phone# [phone]

## 2015-11-03 NOTE — Telephone Encounter (Signed)
Post ED Visit - Positive Culture Follow-up: Successful Patient Follow-Up  Culture assessed and recommendations reviewed by:  Enzo Bi, Pharm.D.  Celedonio Miyamoto, Pharm.D., BCPS  Garvin Fila, Pharm.D.  Georgina Pillion, Pharm.D., BCPS  [place], [place].D., BCPS, AAHIVP  Estella Husk, Pharm.D., BCPS, AAHIVP  Tennis Must, Pharm.D.  Sherle Poe, [address].D.  Carmon Sails Rumbarger, Pharm.D.  Positive throat culture, Abundant streptococcus, beta hemolytic not group A   Patient discharged without antimicrobial prescription and treatment is now indicated  Organism is resistant to prescribed ED discharge antimicrobial  Patient with positive blood cultures  Changes discussed with ED provider: Cheri Fowler PA New antibiotic prescription if symptomatic,  Amoxicillin 500 mg po BID x 10 days Called to   [facility] patient, date 11/03/2015, time 18:33  LVM requesting callback.   Arvid Right 11/03/2015, 6:36 PM

## 2015-11-08 ENCOUNTER — Telehealth (HOSPITAL_COMMUNITY): Payer: Self-pay

## 2016-02-25 ENCOUNTER — Emergency Department (HOSPITAL_COMMUNITY)
Admission: EM | Admit: 2016-02-25 | Discharge: 2016-02-25 | Disposition: A | Discharge status: Home / Self Care | Payer: BLUE CROSS/BLUE SHIELD | Attending: Dermatology | Admitting: Dermatology

## 2016-02-25 ENCOUNTER — Encounter (HOSPITAL_COMMUNITY): Payer: Self-pay | Admitting: *Deleted

## 2016-02-25 DIAGNOSIS — Z5321 Procedure and treatment not carried out due to patient leaving prior to being seen by health care provider: Secondary | ICD-10-CM | POA: Diagnosis not present

## 2016-02-25 DIAGNOSIS — R110 Nausea: Secondary | ICD-10-CM | POA: Insufficient documentation

## 2016-02-25 DIAGNOSIS — J45909 Unspecified asthma, uncomplicated: Secondary | ICD-10-CM | POA: Diagnosis not present

## 2016-02-25 DIAGNOSIS — R197 Diarrhea, unspecified: Secondary | ICD-10-CM | POA: Insufficient documentation

## 2016-02-25 DIAGNOSIS — F1290 Cannabis use, unspecified, uncomplicated: Secondary | ICD-10-CM | POA: Diagnosis not present

## 2016-02-25 HISTORY — DX: Irritable bowel syndrome, unspecified: K58.9

## 2016-02-25 LAB — COMPREHENSIVE METABOLIC PANEL WITH GFR
ALT: 11 U/L — ABNORMAL LOW (ref 14–54)
AST: 17 U/L (ref 15–41)
Albumin: 4.1 g/dL (ref 3.5–5.0)
Alkaline Phosphatase: 52 U/L (ref 38–126)
Anion gap: 7 (ref 5–15)
BUN: 6 mg/dL (ref 6–20)
CO2: 29 mmol/L (ref 22–32)
Calcium: 9.6 mg/dL (ref 8.9–10.3)
Chloride: 100 mmol/L — ABNORMAL LOW (ref 101–111)
Creatinine, Ser: 0.78 mg/dL (ref 0.44–1.00)
GFR calc Af Amer: >60 mL/min (ref >60)
GFR calc non Af Amer: >60 mL/min (ref >60)
Glucose, Bld: 88 mg/dL (ref 65–99)
Potassium: 4.4 mmol/L (ref 3.5–5.1)
Sodium: 136 mmol/L (ref 135–145)
Total Bilirubin: 0.8 mg/dL (ref 0.3–1.2)
Total Protein: 8.4 g/dL — ABNORMAL HIGH (ref 6.5–8.1)

## 2016-02-25 LAB — CBC
HCT: 40 % (ref 36.0–46.0)
HEMOGLOBIN: 12.8 g/dL (ref 12.0–15.0)
MCH: 26.1 pg (ref 26.0–34.0)
MCHC: 32 g/dL (ref 30.0–36.0)
MCV: 81.6 fL (ref 78.0–100.0)
Platelets: 320 10*3/uL (ref 150–400)
RBC: 4.9 MIL/uL (ref 3.87–5.11)
RDW: 15.4 % (ref 11.5–15.5)
WBC: 7.8 10*3/uL (ref 4.0–10.5)

## 2016-02-25 LAB — LIPASE, BLOOD: Lipase: 33 U/L (ref 11–51)

## 2016-02-25 NOTE — ED Notes (Signed)
Pt reports abd cramping with diarrhea and nausea x 5 days.  Has hx of IBS.  Pt reports cramping gets so severe that it's causing her back to spasm.

## 2016-02-25 NOTE — ED Notes (Signed)
Pt states she needs to pick up her Aunt.  Notified pt that her turn should be coming soon.  Pt politely left and states she will return in morning if symptoms continue.

## 2016-02-26 ENCOUNTER — Encounter (HOSPITAL_COMMUNITY): Payer: Self-pay | Admitting: Emergency Medicine

## 2016-02-26 ENCOUNTER — Emergency Department (HOSPITAL_COMMUNITY)
Admission: EM | Admit: 2016-02-26 | Discharge: 2016-02-26 | Disposition: A | Discharge status: Home / Self Care | Payer: BLUE CROSS/BLUE SHIELD | Attending: Emergency Medicine | Admitting: Emergency Medicine

## 2016-02-26 DIAGNOSIS — J45909 Unspecified asthma, uncomplicated: Secondary | ICD-10-CM | POA: Insufficient documentation

## 2016-02-26 DIAGNOSIS — R197 Diarrhea, unspecified: Secondary | ICD-10-CM | POA: Diagnosis not present

## 2016-02-26 DIAGNOSIS — Z79899 Other long term (current) drug therapy: Secondary | ICD-10-CM | POA: Insufficient documentation

## 2016-02-26 DIAGNOSIS — N939 Abnormal uterine and vaginal bleeding, unspecified: Secondary | ICD-10-CM | POA: Diagnosis not present

## 2016-02-26 DIAGNOSIS — F1290 Cannabis use, unspecified, uncomplicated: Secondary | ICD-10-CM | POA: Insufficient documentation

## 2016-02-26 DIAGNOSIS — Z791 Long term (current) use of non-steroidal anti-inflammatories (NSAID): Secondary | ICD-10-CM | POA: Diagnosis not present

## 2016-02-26 LAB — COMPREHENSIVE METABOLIC PANEL
ALBUMIN: 4.2 g/dL (ref 3.5–5.0)
ALT: 11 U/L — ABNORMAL LOW (ref 14–54)
ANION GAP: 8 (ref 5–15)
AST: 17 U/L (ref 15–41)
Alkaline Phosphatase: 52 U/L (ref 38–126)
BILIRUBIN TOTAL: 1.3 mg/dL — AB (ref 0.3–1.2)
BUN: 6 mg/dL (ref 6–20)
CO2: 28 mmol/L (ref 22–32)
Calcium: 9.5 mg/dL (ref 8.9–10.3)
Chloride: 99 mmol/L — ABNORMAL LOW (ref 101–111)
Creatinine, Ser: 0.81 mg/dL (ref 0.44–1.00)
GFR calc Af Amer: >60 mL/min (ref >60)
GFR calc non Af Amer: >60 mL/min (ref >60)
GLUCOSE: 94 mg/dL (ref 65–99)
POTASSIUM: 3.7 mmol/L (ref 3.5–5.1)
Sodium: 135 mmol/L (ref 135–145)
TOTAL PROTEIN: 8.5 g/dL — AB (ref 6.5–8.1)

## 2016-02-26 LAB — URINALYSIS, ROUTINE W REFLEX MICROSCOPIC
Bilirubin Urine: NEGATIVE
Glucose, UA: NEGATIVE mg/dL
Ketones, ur: NEGATIVE mg/dL
LEUKOCYTES UA: NEGATIVE
NITRITE: NEGATIVE
PH: 7.5 (ref 5.0–8.0)
Protein, ur: NEGATIVE mg/dL
SPECIFIC GRAVITY, URINE: 1.019 (ref 1.005–1.030)

## 2016-02-26 LAB — WET PREP, GENITAL
Sperm: NONE SEEN
Trich, Wet Prep: NONE SEEN
Yeast Wet Prep HPF POC: NONE SEEN

## 2016-02-26 LAB — CBC WITH DIFFERENTIAL/PLATELET
BASOS ABS: 0 10*3/uL (ref 0.0–0.1)
Basophils Relative: 0 %
Eosinophils Absolute: 0.1 10*3/uL (ref 0.0–0.7)
Eosinophils Relative: 1 %
HEMATOCRIT: 42.1 % (ref 36.0–46.0)
Hemoglobin: 12.9 g/dL (ref 12.0–15.0)
LYMPHS PCT: 29 %
Lymphs Abs: 2.4 10*3/uL (ref 0.7–4.0)
MCH: 25.4 pg — ABNORMAL LOW (ref 26.0–34.0)
MCHC: 30.6 g/dL (ref 30.0–36.0)
MCV: 82.9 fL (ref 78.0–100.0)
MONO ABS: 0.6 10*3/uL (ref 0.1–1.0)
Monocytes Relative: 7 %
NEUTROS ABS: 5.4 10*3/uL (ref 1.7–7.7)
Neutrophils Relative %: 63 %
Platelets: 313 10*3/uL (ref 150–400)
RBC: 5.08 MIL/uL (ref 3.87–5.11)
RDW: 15.5 % (ref 11.5–15.5)
WBC: 8.5 10*3/uL (ref 4.0–10.5)

## 2016-02-26 LAB — I-STAT BETA HCG BLOOD, ED (MC, WL, AP ONLY): I-stat hCG, quantitative: <5 m[IU]/mL (ref <5)

## 2016-02-26 LAB — URINE MICROSCOPIC-ADD ON

## 2016-02-26 LAB — LIPASE, BLOOD: Lipase: 23 U/L (ref 11–51)

## 2016-02-26 MED ORDER — DICYCLOMINE HCL 20 MG PO TABS
20.0000 mg | ORAL_TABLET | Freq: Two times a day (BID) | ORAL | Status: DC
Start: 2016-02-26 — End: 2016-09-14

## 2016-02-26 MED ORDER — CYCLOBENZAPRINE HCL 10 MG PO TABS: 10.0000 mg | ORAL_TABLET | Freq: Two times a day (BID) | ORAL | Status: DC | PRN

## 2016-02-26 MED ORDER — ONDANSETRON HCL 4 MG PO TABS: 4.0000 mg | ORAL_TABLET | Freq: Four times a day (QID) | ORAL | Status: DC

## 2016-02-26 NOTE — ED Notes (Signed)
Pt c/o n/v/d x "a while."  Pt reports IBS w/ constipation and diarrhea.  Sts she ran out of her medication several weeks ago.  Pt reports vaginal spotting starting this morning.  Sts "it's like I'm going off my period."  Sts low back pain/spasms are chronic "from sports."  Sts "you won't find anything wrong with my back."

## 2016-02-26 NOTE — ED Provider Notes (Signed)
CSN: [id number]     Arrival date & time 02/26/16  1030 History   First MD Initiated Contact with Patient 02/26/16 1131     Chief Complaint  Patient presents with  . Diarrhea  . Vaginal Bleeding   HPI Comments: 20 year old female presents with generalized abdominal pain, nausea, vomiting, diarrhea. Past medical history significant for IBS. She states that she's been having symptoms for the past 5 days. She did present to the emergency room yesterday afternoon however she decided to not be seen at the time and to come back if her symptoms persisted. She states the abdominal pain is crampy and generalized. It is intermittent. She has been having similar symptoms since the beginning of the year when she was diagnosed with IBS. She was placed on Bentyl which has provided her relief however because she does not have a primary care doctor she has not been able to obtain refills and has been out of this medication. She reports associated nausea and vomiting and back spasms when the abdominal cramping is severe. She has had 2 episode of nonbilious emesis since yesterday. She is also been having vaginal spotting for one day. She denies being pregnant. She states her last [place]. Was 2 weeks ago which was normal. And that this has never happened before. She is not on hormonal birth control. Denies fever, chills, shortness of breath, association with food intake, constipation, dysuria, vaginal discharge. She has never had a colonoscopy or endoscopy.  Patient is a 20 y.o. female presenting with diarrhea and vaginal bleeding.  Diarrhea Associated symptoms: no chills and no fever   Vaginal Bleeding Associated symptoms: no fever     Past Medical History  Diagnosis Date  . Asthma   . IBS (irritable bowel syndrome)    History reviewed. No pertinent past surgical history. Family History  Problem Relation Age of Onset  . Hypertension Mother   . Cancer Mother   . Stroke Mother    Social History  Substance  Use Topics  . Smoking status: Never Smoker   . Smokeless tobacco: None  . Alcohol Use: No     Comment: occasional   OB History    Gravida Para Term Preterm AB TAB SAB Ectopic Multiple Living   0 0 0 0 0 0 0 0 0 0      Review of Systems  Constitutional: Negative for fever and chills.  Gastrointestinal: Positive for diarrhea.  Genitourinary: Positive for vaginal bleeding.  All other systems reviewed and are negative.     Allergies  Review of patient's allergies indicates no known allergies.  Home Medications   Prior to Admission medications   Medication Sig Start Date End Date Taking? Authorizing Provider  acetaminophen-codeine (TYLENOL #3) 300-30 MG tablet Take 1-2 tablets by mouth every 6 (six) hours as needed for moderate pain. 08/26/15   Trixie DredgeEmily West, PA-C  albuterol (PROVENTIL HFA;VENTOLIN HFA) 108 (90 BASE) MCG/ACT inhaler Inhale 2 puffs into the lungs every 6 (six) hours as needed for wheezing or shortness of breath.    Historical Provider, MD  amoxicillin (AMOXIL) 500 MG capsule Take 1 capsule (500 mg total) by mouth 3 (three) times daily. Patient not taking: Reported on 06/13/2015 06/24/14   Raeford RazorStephen Kohut, MD  cephALEXin (KEFLEX) 500 MG capsule Take 1 capsule (500 mg total) by mouth 3 (three) times daily. Patient not taking: Reported on 06/13/2015 03/05/15   Tatyana Kirichenko, PA-C  cetirizine-pseudoephedrine (ZYRTEC-D) 5-120 MG per tablet Take 1 tablet by mouth daily. Patient not taking:  Reported on 06/13/2015 11/22/14   Teressa Lower, NP  cyclobenzaprine (FLEXERIL) 10 MG tablet Take 1 tablet (10 mg total) by mouth 2 (two) times daily as needed for muscle spasms. 11/01/15   Cheri Fowler, PA-C  dicyclomine (BENTYL) 20 MG tablet Take 1 tablet (20 mg total) by mouth 2 (two) times daily. 06/13/15   Jerelyn Scott, MD  guaiFENesin-codeine 100-10 MG/5ML syrup Take 5-10 mLs by mouth 3 (three) times daily as needed for cough. Patient not taking: Reported on 06/13/2015 06/24/14   Raeford Razor, MD  ibuprofen (ADVIL,MOTRIN) 800 MG tablet Take 1 tablet (800 mg total) by mouth 3 (three) times daily. 11/01/15   Cheri Fowler, PA-C  lidocaine (XYLOCAINE) 2 % solution Use as directed 20 mLs in the mouth or throat as needed for mouth pain. 11/01/15   Cheri Fowler, PA-C  naproxen (NAPROSYN) 500 MG tablet Take 1 tablet (500 mg total) by mouth 2 (two) times daily. Patient not taking: Reported on 06/13/2015 03/05/15   Tatyana Kirichenko, PA-C   BP 116/82 mmHg  Pulse 79  Temp(Src) 98.3 F (36.8 C) (Oral)  Resp 14  SpO2 100%  LMP 02/11/2016   Physical Exam  Constitutional: She is oriented to person, place, and time. She appears well-developed and well-nourished. No distress.  HENT:  Head: Normocephalic and atraumatic.  Eyes: Conjunctivae are normal. Pupils are equal, round, and reactive to light. Right eye exhibits no discharge. Left eye exhibits no discharge. No scleral icterus.  Neck: Normal range of motion.  Cardiovascular: Normal rate and regular rhythm.  Exam reveals no gallop and no friction rub.   No murmur heard. Pulmonary/Chest: Effort normal and breath sounds normal. No respiratory distress. She has no wheezes. She has no rales. She exhibits no tenderness.  Abdominal: Soft. Bowel sounds are normal. She exhibits no distension and no mass. There is tenderness. There is no rebound and no guarding.  Mild, generalized tenderness  Genitourinary:  No inguinal lymphadenopathy or inguinal hernia noted. Normal external genitalia. No pain with speculum insertion. Closed cervical os with normal appearance - no rash or lesions. No significant discharge or bleeding noted from cervix or in vaginal vault. On bimanual examination no adnexal tenderness or cervical motion tenderness. Chaperone present during exam.    Neurological: She is alert and oriented to person, place, and time.  Skin: Skin is warm and dry.  Psychiatric: She has a normal mood and affect. Her behavior is normal.    ED Course   Procedures (including critical care time) Labs Review Labs Reviewed  WET PREP, GENITAL - Abnormal; Notable for the following:    Clue Cells Wet Prep HPF POC PRESENT (*)    WBC, Wet Prep HPF POC MANY (*)    All other components within normal limits  COMPREHENSIVE METABOLIC PANEL - Abnormal; Notable for the following:    Chloride 99 (*)    Total Protein 8.5 (*)    ALT 11 (*)    Total Bilirubin 1.3 (*)    All other components within normal limits  CBC WITH DIFFERENTIAL/PLATELET - Abnormal; Notable for the following:    MCH 25.4 (*)    All other components within normal limits  URINALYSIS, ROUTINE W REFLEX MICROSCOPIC (NOT AT [employer]) - Abnormal; Notable for the following:    APPearance CLOUDY (*)    Hgb urine dipstick TRACE (*)    All other components within normal limits  URINE MICROSCOPIC-ADD ON - Abnormal; Notable for the following:    Squamous Epithelial / LPF 0-5 (*)  Bacteria, UA FEW (*)    All other components within normal limits  URINE CULTURE  LIPASE, BLOOD  I-STAT BETA HCG BLOOD, ED ([facility], [facility], [facility] ONLY)    Imaging Review No results found. I have personally reviewed and evaluated these images and lab results as part of my medical decision-making.   EKG Interpretation None      MDM   Final diagnoses:  Diarrhea, unspecified type  Vaginal spotting   20 year old female presents with abdominal pain, N/V, diarrhea/consipation as well as vaginal spotting. Pt has been diagnosed with IBS and reported relief with medications which she is out of. Vitals are stable and exam is benign. Pelvic exam is unremarkable. Will refill Zofran, bentyl, and flexaril for back spasms. Also discussed establishment with PCP, grandmother is in room and states she will make appt for patient. Labs are overall unremarkable. Patient is NAD, non-toxic, with stable VS. Patient is informed of clinical course, understands medical decision making process, and agrees with plan. Opportunity for questions  provided and all questions answered. Return precautions given.     Bethel Born, PA-C 02/27/16 [id number]  Lavera Guise, MD 02/27/16 [phone]

## 2016-02-26 NOTE — ED Notes (Signed)
Pt reports nv/d for past few days. hx of IBS. Was here yesterday for same, but had to leave prior to being seen. Also reports back pain and vaginal spotting that started this am. Last menstrual cycle started 2 weeks ago.

## 2016-02-26 NOTE — ED Notes (Signed)
Verbalized understanding discharge instructions, prescriptions, and follow-up. In no acute distress.   

## 2016-02-27 LAB — URINE CULTURE

## 2016-05-18 ENCOUNTER — Encounter (HOSPITAL_COMMUNITY): Payer: Self-pay | Admitting: *Deleted

## 2016-05-18 ENCOUNTER — Emergency Department (HOSPITAL_COMMUNITY)
Admission: EM | Admit: 2016-05-18 | Discharge: 2016-05-18 | Disposition: A | Discharge status: Home / Self Care | Payer: BLUE CROSS/BLUE SHIELD | Attending: Emergency Medicine | Admitting: Emergency Medicine

## 2016-05-18 DIAGNOSIS — G4489 Other headache syndrome: Secondary | ICD-10-CM | POA: Diagnosis not present

## 2016-05-18 DIAGNOSIS — B9689 Other specified bacterial agents as the cause of diseases classified elsewhere: Secondary | ICD-10-CM | POA: Diagnosis not present

## 2016-05-18 DIAGNOSIS — J45909 Unspecified asthma, uncomplicated: Secondary | ICD-10-CM | POA: Insufficient documentation

## 2016-05-18 DIAGNOSIS — N939 Abnormal uterine and vaginal bleeding, unspecified: Secondary | ICD-10-CM | POA: Diagnosis present

## 2016-05-18 DIAGNOSIS — N760 Acute vaginitis: Secondary | ICD-10-CM | POA: Insufficient documentation

## 2016-05-18 LAB — WET PREP, GENITAL
SPERM: NONE SEEN
TRICH WET PREP: NONE SEEN
Yeast Wet Prep HPF POC: NONE SEEN

## 2016-05-18 LAB — BASIC METABOLIC PANEL
Anion gap: 7 (ref 5–15)
BUN: 7 mg/dL (ref 6–20)
CALCIUM: 9.1 mg/dL (ref 8.9–10.3)
CO2: 26 mmol/L (ref 22–32)
Chloride: 102 mmol/L (ref 101–111)
Creatinine, Ser: 0.77 mg/dL (ref 0.44–1.00)
GFR calc Af Amer: >60 mL/min (ref >60)
Glucose, Bld: 88 mg/dL (ref 65–99)
POTASSIUM: 3.3 mmol/L — AB (ref 3.5–5.1)
SODIUM: 135 mmol/L (ref 135–145)

## 2016-05-18 LAB — URINE MICROSCOPIC-ADD ON: RBC / HPF: NONE SEEN RBC/hpf (ref 0–5)

## 2016-05-18 LAB — CBC WITH DIFFERENTIAL/PLATELET
BASOS ABS: 0 10*3/uL (ref 0.0–0.1)
Basophils Relative: 0 %
EOS ABS: 0 10*3/uL (ref 0.0–0.7)
EOS PCT: 0 %
HCT: 39.4 % (ref 36.0–46.0)
Hemoglobin: 11.9 g/dL — ABNORMAL LOW (ref 12.0–15.0)
LYMPHS PCT: 29 %
Lymphs Abs: 1.9 10*3/uL (ref 0.7–4.0)
MCH: 25.4 pg — AB (ref 26.0–34.0)
MCHC: 30.2 g/dL (ref 30.0–36.0)
MCV: 84 fL (ref 78.0–100.0)
Monocytes Absolute: 0.4 10*3/uL (ref 0.1–1.0)
Monocytes Relative: 6 %
Neutro Abs: 4.2 10*3/uL (ref 1.7–7.7)
Neutrophils Relative %: 65 %
PLATELETS: 278 10*3/uL (ref 150–400)
RBC: 4.69 MIL/uL (ref 3.87–5.11)
RDW: 16.6 % — ABNORMAL HIGH (ref 11.5–15.5)
WBC: 6.6 10*3/uL (ref 4.0–10.5)

## 2016-05-18 LAB — URINALYSIS, ROUTINE W REFLEX MICROSCOPIC
BILIRUBIN URINE: NEGATIVE
GLUCOSE, UA: NEGATIVE mg/dL
HGB URINE DIPSTICK: NEGATIVE
KETONES UR: 40 mg/dL — AB
Nitrite: NEGATIVE
PROTEIN: NEGATIVE mg/dL
Specific Gravity, Urine: 1.038 — ABNORMAL HIGH (ref 1.005–1.030)
pH: 6 (ref 5.0–8.0)

## 2016-05-18 LAB — POC URINE PREG, ED: PREG TEST UR: NEGATIVE

## 2016-05-18 MED ORDER — METOCLOPRAMIDE HCL 5 MG/ML IJ SOLN
10.0000 mg | Freq: Once | INTRAMUSCULAR | Status: AC
  Administered 2016-05-18: 10 mg via INTRAVENOUS
  Filled 2016-05-18: qty 2

## 2016-05-18 MED ORDER — SODIUM CHLORIDE 0.9 % IV SOLN
INTRAVENOUS | Status: DC
  Administered 2016-05-18: 16:00:00 via INTRAVENOUS

## 2016-05-18 MED ORDER — METRONIDAZOLE 500 MG PO TABS: 500.0000 mg | ORAL_TABLET | Freq: Two times a day (BID) | ORAL | 0 refills | Status: DC

## 2016-05-18 MED ORDER — DEXAMETHASONE SODIUM PHOSPHATE 10 MG/ML IJ SOLN
10.0000 mg | Freq: Once | INTRAMUSCULAR | Status: AC
  Administered 2016-05-18: 10 mg via INTRAVENOUS
  Filled 2016-05-18: qty 1

## 2016-05-18 MED ORDER — DIPHENHYDRAMINE HCL 50 MG/ML IJ SOLN
12.5000 mg | Freq: Once | INTRAMUSCULAR | Status: AC
  Administered 2016-05-18: 12.5 mg via INTRAVENOUS
  Filled 2016-05-18: qty 1

## 2016-05-18 NOTE — ED Provider Notes (Signed)
[facility] DEPT Provider Note   CSN: [id number] Arrival date & time: 05/18/16  1019     History   Chief Complaint Chief Complaint  Patient presents with  . Headache  . Vaginal Bleeding    HPI Emily Callahan is a 20 y.o. female who presents to the ED with vaginal bleeding and headache. Patient reports that she had a period last week that was heavier than normal and more cramping. After the period was over she stated cramping and spotting that has continued off and on. The headache is around the right eye and started 2 days ago. She has taken ibuprofen and gets some relief but it never completely goes away. Last took ibuprofen this morning.   The history is provided by the patient. No language interpreter was used.  Headache   This is a new problem. The current episode started 2 days ago. The headache is associated with the menstrual cycle. The pain is located in the temporal region. The quality of the pain is described as sharp. The pain is moderate. The pain does not radiate. Associated symptoms include malaise/fatigue. Pertinent negatives include no fever, no syncope, no shortness of breath, no nausea and no vomiting. She has tried NSAIDs for the symptoms. The treatment provided moderate relief.  Vaginal Bleeding  Primary symptoms include vaginal bleeding. There has been no fever. This is a new problem. The current episode started more than 1 week ago. The discharge was bloody and milky. Associated symptoms include abdominal pain and light-headedness. Pertinent negatives include no diarrhea, no nausea, no vomiting and no frequency. She has tried NSAIDs for the symptoms. Sexual activity: sexually active. She uses nothing for contraception. Associated medical issues do not include STD, PID, herpes simplex, perineal abscess, ovarian cysts, endometriosis, gynecological surgery or miscarriage.    Past Medical History:  Diagnosis Date  . Asthma   . IBS (irritable bowel syndrome)      Patient Active Problem List   Diagnosis Date Noted  . Asthma     History reviewed. No pertinent surgical history.  OB History    Gravida Para Term Preterm AB Living   0 0 0 0 0 0   SAB TAB Ectopic Multiple Live Births   0 0 0 0         Home Medications    Prior to Admission medications   Medication Sig Start Date End Date Taking? Authorizing Provider  acetaminophen (TYLENOL) 500 MG tablet Take 1,000 mg by mouth every 6 (six) hours as needed for mild pain, moderate pain, fever or headache.    Historical Provider, MD  albuterol (PROVENTIL HFA;VENTOLIN HFA) 108 (90 BASE) MCG/ACT inhaler Inhale 2 puffs into the lungs every 6 (six) hours as needed for wheezing or shortness of breath.    Historical Provider, MD  cyclobenzaprine (FLEXERIL) 10 MG tablet Take 1 tablet (10 mg total) by mouth 2 (two) times daily as needed for muscle spasms. 02/26/16   Bethel Born, PA-C  dicyclomine (BENTYL) 20 MG tablet Take 1 tablet (20 mg total) by mouth 2 (two) times daily. 02/26/16   Bethel Born, PA-C  metroNIDAZOLE (FLAGYL) 500 MG tablet Take 1 tablet (500 mg total) by mouth 2 (two) times daily. 05/18/16   Amora Sheehy Orlene Och, NP  ondansetron (ZOFRAN) 4 MG tablet Take 1 tablet (4 mg total) by mouth every 6 (six) hours. 02/26/16   Bethel Born, PA-C    Family History Family History  Problem Relation Age of Onset  . Hypertension  Mother   . Cancer Mother   . Stroke Mother     Social History Social History  Substance Use Topics  . Smoking status: Never Smoker  . Smokeless tobacco: Never Used  . Alcohol use Yes     Comment: occasional     Allergies   Review of patient's allergies indicates no known allergies.   Review of Systems Review of Systems  Constitutional: Positive for malaise/fatigue. Negative for fever.  Eyes: Positive for photophobia.  Respiratory: Negative for shortness of breath.   Cardiovascular: Negative for chest pain and syncope.  Gastrointestinal: Positive for  abdominal pain. Negative for diarrhea, nausea and vomiting.  Genitourinary: Positive for vaginal bleeding. Negative for frequency.  Skin: Negative for rash.  Neurological: Positive for light-headedness and headaches. Negative for syncope.  Psychiatric/Behavioral: Negative for confusion. The patient is not nervous/anxious.      Physical Exam Updated Vital Signs BP 117/69 (BP Location: Right Arm)   Pulse 68   Temp 98.8 F (37.1 C) (Oral)   Resp 20   Ht 5\' 4"  (1.626 m)   Wt 72.6 kg   LMP 05/10/2016   SpO2 100%   BMI 27.46 kg/m   Physical Exam  Constitutional: She is oriented to person, place, and time. She appears well-developed and well-nourished. No distress.  HENT:  Head: Normocephalic and atraumatic.  Right Ear: Tympanic membrane normal.  Left Ear: Tympanic membrane normal.  Nose: Nose normal.  Mouth/Throat: Uvula is midline, oropharynx is clear and moist and mucous membranes are normal.  Eyes: Conjunctivae and EOM are normal.  Neck: Normal range of motion. Neck supple.  Cardiovascular: Normal rate and regular rhythm.   Pulmonary/Chest: Effort normal. She has no wheezes. She has no rales.  Abdominal: Soft. Bowel sounds are normal. She exhibits no mass. There is no tenderness.  Genitourinary:  Genitourinary Comments: External genitalia without lesions, white d/c vaginal vault, no bleeding noted. No CMT, no adnexal tenderness, uterus without palpable enlargement.   Musculoskeletal: Normal range of motion. She exhibits no edema.  Radial and pedal pulses strong, adequate circulation, good touch sensation.  Neurological: She is alert and oriented to person, place, and time. She has normal strength. No cranial nerve deficit or sensory deficit. She displays a negative Romberg sign. Coordination and gait normal.  Reflex Scores:      Bicep reflexes are 2+ on the right side and 2+ on the left side.      Brachioradialis reflexes are 2+ on the right side and 2+ on the left side.       Patellar reflexes are 2+ on the right side and 2+ on the left side. Rapid alternating movement without difficulty. Stands on one foot without difficulty.  Skin: Skin is warm and dry.  Psychiatric: She has a normal mood and affect. Her behavior is normal. Thought content normal.  Nursing note and vitals reviewed.    ED Treatments / Results  Labs (all labs ordered are listed, but only abnormal results are displayed) Labs Reviewed  WET PREP, GENITAL - Abnormal; Notable for the following:       Result Value   Clue Cells Wet Prep HPF POC PRESENT (*)    WBC, Wet Prep HPF POC FEW (*)    All other components within normal limits  CBC WITH DIFFERENTIAL/PLATELET - Abnormal; Notable for the following:    Hemoglobin 11.9 (*)    MCH 25.4 (*)    RDW 16.6 (*)    All other components within normal limits  BASIC  METABOLIC PANEL - Abnormal; Notable for the following:    Potassium 3.3 (*)    All other components within normal limits  URINALYSIS, ROUTINE W REFLEX MICROSCOPIC (NOT AT [facility]) - Abnormal; Notable for the following:    Color, Urine AMBER (*)    APPearance HAZY (*)    Specific Gravity, Urine 1.038 (*)    Ketones, ur 40 (*)    Leukocytes, UA TRACE (*)    All other components within normal limits  URINE MICROSCOPIC-ADD ON - Abnormal; Notable for the following:    Squamous Epithelial / LPF 6-30 (*)    Bacteria, UA FEW (*)    Crystals CA OXALATE CRYSTALS (*)    All other components within normal limits  HIV ANTIBODY (ROUTINE TESTING)  RPR  POC URINE PREG, ED  GC/CHLAMYDIA PROBE AMP ([place]) NOT AT [facility]   Radiology No results found.  Procedures Procedures (including critical care time)  Medications Ordered in ED Medications  dexamethasone (DECADRON) injection 10 mg (10 mg Intravenous Given 05/18/16 1621)  diphenhydrAMINE (BENADRYL) injection 12.5 mg (12.5 mg Intravenous Given 05/18/16 1621)  metoCLOPramide (REGLAN) injection 10 mg (10 mg Intravenous Given 05/18/16 1621)      Initial Impression / Assessment and Plan / ED Course  I have reviewed the triage vital signs and the nursing notes.  Pertinent labs & imaging results that were available during my care of the patient were reviewed by me and considered in my medical decision making (see chart for details).  Clinical Course   Patient headache resolved after NS IV 1,000ccs, Benadryl 12.5 mg, Reglan 10 mg. And Decadron 10 mg IV.   Final Clinical Impressions(s) / ED Diagnoses  20 y.o. female with headache and abnormal vaginal bleeding stable for d/c without bleeding at this time and headache resolved. Encouraged patient to f/u with her GYN for further evaluation if symptoms persist. She voices understanding and agrees with plan. Will treat for BV.  Final diagnoses:  Other headache syndrome  BV (bacterial vaginosis)    New Prescriptions Discharge Medication List as of 05/18/2016  6:07 PM    START taking these medications   Details  metroNIDAZOLE (FLAGYL) 500 MG tablet Take 1 tablet (500 mg total) by mouth 2 (two) times daily., Starting Mon 05/18/2016, Print         [place], NP 05/18/16 2145    Marily Memos, MD 05/19/16 [phone]

## 2016-05-18 NOTE — ED Triage Notes (Signed)
C/o headache onset 2 days ago c/o vag. Spotting onset 1 weeks ago.

## 2016-05-18 NOTE — ED Notes (Signed)
While gettting vitalsigns  Wealth[employer].glPt.complains of head was hurting worst.notified nurse 1st.she came over to assses.the pt..Marland Kitchen

## 2016-05-19 LAB — HIV ANTIBODY (ROUTINE TESTING W REFLEX): HIV SCREEN 4TH GENERATION: NONREACTIVE

## 2016-05-19 LAB — GC/CHLAMYDIA PROBE AMP (~~LOC~~) NOT AT ARMC
CHLAMYDIA, DNA PROBE: NEGATIVE
NEISSERIA GONORRHEA: POSITIVE — AB

## 2016-05-19 LAB — RPR: RPR: NONREACTIVE

## 2016-05-20 ENCOUNTER — Telehealth (HOSPITAL_COMMUNITY): Payer: Self-pay

## 2016-05-20 ENCOUNTER — Telehealth (HOSPITAL_BASED_OUTPATIENT_CLINIC_OR_DEPARTMENT_OTHER): Payer: Self-pay

## 2016-05-20 NOTE — Telephone Encounter (Signed)
Char reviewed by Dr Kirtland BouchardK. Campos "Cefixime 400 mg po x 1, Azithromycin 1 gram po x 1"  Attempted to call No answer and no VM.  [facility] [employer] form faxed

## 2016-05-20 NOTE — Telephone Encounter (Signed)
+   Gonorrhea from 05/18/16  No treatment in ED. Chart sent to EDP for review and meds.

## 2016-05-25 ENCOUNTER — Encounter (HOSPITAL_COMMUNITY): Payer: Self-pay | Admitting: Emergency Medicine

## 2016-05-25 ENCOUNTER — Emergency Department (HOSPITAL_COMMUNITY)
Admission: EM | Admit: 2016-05-25 | Discharge: 2016-05-25 | Disposition: A | Discharge status: Home / Self Care | Payer: BLUE CROSS/BLUE SHIELD | Attending: Emergency Medicine | Admitting: Emergency Medicine

## 2016-05-25 DIAGNOSIS — Z79899 Other long term (current) drug therapy: Secondary | ICD-10-CM | POA: Insufficient documentation

## 2016-05-25 DIAGNOSIS — A64 Unspecified sexually transmitted disease: Secondary | ICD-10-CM

## 2016-05-25 DIAGNOSIS — N760 Acute vaginitis: Secondary | ICD-10-CM | POA: Insufficient documentation

## 2016-05-25 DIAGNOSIS — J45909 Unspecified asthma, uncomplicated: Secondary | ICD-10-CM | POA: Insufficient documentation

## 2016-05-25 DIAGNOSIS — R350 Frequency of micturition: Secondary | ICD-10-CM | POA: Diagnosis present

## 2016-05-25 DIAGNOSIS — B9689 Other specified bacterial agents as the cause of diseases classified elsewhere: Secondary | ICD-10-CM

## 2016-05-25 LAB — URINALYSIS, ROUTINE W REFLEX MICROSCOPIC
Bilirubin Urine: NEGATIVE
Glucose, UA: NEGATIVE mg/dL
Hgb urine dipstick: NEGATIVE
Ketones, ur: NEGATIVE mg/dL
Leukocytes, UA: NEGATIVE
Nitrite: NEGATIVE
Protein, ur: NEGATIVE mg/dL
Specific Gravity, Urine: 1.012 (ref 1.005–1.030)
pH: 8 (ref 5.0–8.0)

## 2016-05-25 LAB — WET PREP, GENITAL
Sperm: NONE SEEN
Trich, Wet Prep: NONE SEEN
Yeast Wet Prep HPF POC: NONE SEEN

## 2016-05-25 LAB — POC URINE PREG, ED: Preg Test, Ur: NEGATIVE

## 2016-05-25 MED ORDER — METRONIDAZOLE 500 MG PO TABS: 500.0000 mg | ORAL_TABLET | Freq: Two times a day (BID) | ORAL | 0 refills | Status: DC

## 2016-05-25 MED ORDER — LIDOCAINE HCL (PF) 1 % IJ SOLN
INTRAMUSCULAR | Status: AC
  Administered 2016-05-25: 0.9 mL
  Filled 2016-05-25: qty 30

## 2016-05-25 MED ORDER — AZITHROMYCIN 250 MG PO TABS
1000.0000 mg | ORAL_TABLET | Freq: Once | ORAL | Status: AC
  Administered 2016-05-25: 1000 mg via ORAL
  Filled 2016-05-25: qty 4

## 2016-05-25 MED ORDER — CEFTRIAXONE SODIUM 250 MG IJ SOLR
250.0000 mg | Freq: Once | INTRAMUSCULAR | Status: AC
  Administered 2016-05-25: 250 mg via INTRAMUSCULAR
  Filled 2016-05-25: qty 250

## 2016-05-25 NOTE — ED Notes (Signed)
Pt presents after positive gonorrhea test around 9/11.  Sts abdominal cramping and abnormal vaginal bleeding x 2 weeks.  Sts bleeding resolved x 5 days ago.    [facility]  Sts "they didn't have an appointment for me to be treated until tomorrow, but I just couldn't wait."

## 2016-05-25 NOTE — ED Triage Notes (Signed)
Pt sts that every two months she goes to the [facility] to get tested for STD's because she "Just can't trust" the person that she sleeps with. Pt sts that last week she went to get checked again and received a letter on Friday saying that she tested positive for gonorrhea. Pt here for treatment. Pt c/o vaginal bleeding and cramping, unsure if it is associated with the STD. A&Ox4 and ambulatory.

## 2016-05-25 NOTE — Discharge Instructions (Signed)
Take antibiotics as prescribed. Do not drink while taking this medication. Follow up with women's health with your primary care provider for symptoms do not improve. Return to the ED if he experienced fever, vomiting, increased abdominal pain.

## 2016-05-26 LAB — URINE CULTURE

## 2016-05-26 LAB — GC/CHLAMYDIA PROBE AMP (~~LOC~~) NOT AT ARMC
Chlamydia: NEGATIVE
Neisseria Gonorrhea: POSITIVE — AB

## 2016-05-27 ENCOUNTER — Telehealth (HOSPITAL_BASED_OUTPATIENT_CLINIC_OR_DEPARTMENT_OTHER): Payer: Self-pay

## 2016-05-29 NOTE — ED Provider Notes (Signed)
[facility] Provider Note   CSN: [id number] Arrival date & time: 05/25/16  1130     History   Chief Complaint Chief Complaint  Patient presents with  . SEXUALLY TRANSMITTED DISEASE  . Urinary Frequency    HPI Emily Callahan is a 20 y.o. female with no significant pmhx who presents to the ED today requesting STD treatment. Pt states that she went to the Health department last week and was tested for STDs. Pt states that she was notified this morning that she tested positive for gonorrhea but was unable to be seen today for treatment so she came to the ED for treatment. Pt reports mild abdominal cramping. No fevers, chills, dysuria, vaginal discharge, dyspareunia, vaginal bleeding. LMP was 05/10/16.Pt is sexually active with 1 female partner. No contraception usage.   HPI  Past Medical History:  Diagnosis Date  . Asthma   . IBS (irritable bowel syndrome)     Patient Active Problem List   Diagnosis Date Noted  . Asthma     History reviewed. No pertinent surgical history.  OB History    Gravida Para Term Preterm AB Living   0 0 0 0 0 0   SAB TAB Ectopic Multiple Live Births   0 0 0 0         Home Medications    Prior to Admission medications   Medication Sig Start Date End Date Taking? Authorizing Provider  cyclobenzaprine (FLEXERIL) 10 MG tablet Take 1 tablet (10 mg total) by mouth 2 (two) times daily as needed for muscle spasms. 02/26/16  Yes Bethel BornKelly Marie Gekas, PA-C  LINZESS 290 MCG CAPS capsule Take 290 mg by mouth daily as needed for spasms. 03/30/16  Yes Historical Provider, MD  albuterol (PROVENTIL HFA;VENTOLIN HFA) 108 (90 BASE) MCG/ACT inhaler Inhale 2 puffs into the lungs every 6 (six) hours as needed for wheezing or shortness of breath.    Historical Provider, MD  dicyclomine (BENTYL) 20 MG tablet Take 1 tablet (20 mg total) by mouth 2 (two) times daily. Patient not taking: Reported on 05/25/2016 02/26/16   Bethel BornKelly Marie Gekas, PA-C  metroNIDAZOLE (FLAGYL) 500  MG tablet Take 1 tablet (500 mg total) by mouth 2 (two) times daily. Patient not taking: Reported on 05/25/2016 05/18/16   Janne NapoleonHope M Neese, NP  metroNIDAZOLE (FLAGYL) 500 MG tablet Take 1 tablet (500 mg total) by mouth 2 (two) times daily. 05/25/16   Rael Tilly Tripp Zarie Kosiba, PA-C  ondansetron (ZOFRAN) 4 MG tablet Take 1 tablet (4 mg total) by mouth every 6 (six) hours. Patient not taking: Reported on 05/25/2016 02/26/16   Bethel BornKelly Marie Gekas, PA-C    Family History Family History  Problem Relation Age of Onset  . Hypertension Mother   . Cancer Mother   . Stroke Mother     Social History Social History  Substance Use Topics  . Smoking status: Never Smoker  . Smokeless tobacco: Never Used  . Alcohol use Yes     Comment: occasional     Allergies   Review of patient's allergies indicates no known allergies.   Review of Systems Review of Systems  All other systems reviewed and are negative.    Physical Exam Updated Vital Signs BP 124/82 (BP Location: Right Arm)   Pulse 70   Temp 98.1 F (36.7 C) (Oral)   Resp 18   LMP 05/10/2016   SpO2 (!) 10%   Physical Exam  Constitutional: She is oriented to person, place, and time. She appears well-developed and  well-nourished. No distress.  HENT:  Head: Normocephalic and atraumatic.  Eyes: Conjunctivae are normal. Right eye exhibits no discharge. Left eye exhibits no discharge. No scleral icterus.  Cardiovascular: Normal rate.   Pulmonary/Chest: Effort normal.  Abdominal: Soft. Bowel sounds are normal. She exhibits no distension. There is no tenderness. There is no guarding.  Genitourinary: Vaginal discharge found.  Genitourinary Comments: No CMT. No adnexal tenderness.  Neurological: She is alert and oriented to person, place, and time. Coordination normal.  Skin: Skin is warm and dry. No rash noted. She is not diaphoretic. No erythema. No pallor.  Psychiatric: She has a normal mood and affect. Her behavior is normal.  Nursing note and  vitals reviewed.    ED Treatments / Results  Labs (all labs ordered are listed, but only abnormal results are displayed) Labs Reviewed  WET PREP, GENITAL - Abnormal; Notable for the following:       Result Value   Clue Cells Wet Prep HPF POC PRESENT (*)    WBC, Wet Prep HPF POC MANY (*)    All other components within normal limits  URINE CULTURE - Abnormal; Notable for the following:    Culture MULTIPLE SPECIES PRESENT, SUGGEST RECOLLECTION (*)    All other components within normal limits  URINALYSIS, ROUTINE W REFLEX MICROSCOPIC (NOT AT [facility]) - Abnormal; Notable for the following:    APPearance CLOUDY (*)    All other components within normal limits  GC/CHLAMYDIA PROBE AMP ([place]) NOT AT [facility] - Abnormal; Notable for the following:    Neisseria gonorrhea **POSITIVE** (*)    All other components within normal limits  POC URINE PREG, ED    EKG  EKG Interpretation None       Radiology No results found.  Procedures Procedures (including critical care time)  Medications Ordered in ED Medications  cefTRIAXone (ROCEPHIN) injection 250 mg (250 mg Intramuscular Given 05/25/16 1533)  azithromycin (ZITHROMAX) tablet 1,000 mg (1,000 mg Oral Given 05/25/16 1533)  lidocaine (PF) (XYLOCAINE) 1 % injection (0.9 mLs  Given 05/25/16 1533)     Initial Impression / Assessment and Plan / ED Course  I have reviewed the triage vital signs and the nursing notes.  Pertinent labs & imaging results that were available during my care of the patient were reviewed by me and considered in my medical decision making (see chart for details).  Clinical Course    Patient to be discharged with instructions to follow up with OBGYN. Discussed importance of using protection when sexually active. Pt has been treated prophylacticly with azithromycin and rocephin. Pt not concerning for TOA or PID because hemodynamically stable and no cervical motion tenderness on pelvic exam. Pt has also been treated  with flagyl for Bacterial Vaginosis. Pt has been advised to not drink alcohol while on this medication.    Final Clinical Impressions(s) / ED Diagnoses   Final diagnoses:  BV (bacterial vaginosis)  STD (female)    New Prescriptions Discharge Medication List as of 05/25/2016  3:32 PM    START taking these medications   Details  !! metroNIDAZOLE (FLAGYL) 500 MG tablet Take 1 tablet (500 mg total) by mouth 2 (two) times daily., Starting Mon 05/25/2016, Print     !! - Potential duplicate medications found. Please discuss with provider.       Lester Kinsman Caney, PA-C 05/29/16 1118    Azalia Bilis, MD 05/29/16 2122

## 2016-06-30 ENCOUNTER — Telehealth (HOSPITAL_BASED_OUTPATIENT_CLINIC_OR_DEPARTMENT_OTHER): Payer: Self-pay | Admitting: Emergency Medicine

## 2016-06-30 NOTE — Telephone Encounter (Signed)
Lost to followup 

## 2016-08-03 ENCOUNTER — Emergency Department (HOSPITAL_COMMUNITY)
Admission: EM | Admit: 2016-08-03 | Discharge: 2016-08-03 | Disposition: A | Discharge status: Home / Self Care | Payer: BLUE CROSS/BLUE SHIELD | Attending: Emergency Medicine | Admitting: Emergency Medicine

## 2016-08-03 ENCOUNTER — Encounter (HOSPITAL_COMMUNITY): Payer: Self-pay

## 2016-08-03 ENCOUNTER — Emergency Department (HOSPITAL_COMMUNITY): Payer: BLUE CROSS/BLUE SHIELD

## 2016-08-03 DIAGNOSIS — Z3A01 Less than 8 weeks gestation of pregnancy: Secondary | ICD-10-CM | POA: Diagnosis not present

## 2016-08-03 DIAGNOSIS — R1013 Epigastric pain: Secondary | ICD-10-CM | POA: Diagnosis not present

## 2016-08-03 DIAGNOSIS — O26891 Other specified pregnancy related conditions, first trimester: Secondary | ICD-10-CM | POA: Insufficient documentation

## 2016-08-03 DIAGNOSIS — R112 Nausea with vomiting, unspecified: Secondary | ICD-10-CM | POA: Insufficient documentation

## 2016-08-03 DIAGNOSIS — R110 Nausea: Secondary | ICD-10-CM

## 2016-08-03 DIAGNOSIS — J45909 Unspecified asthma, uncomplicated: Secondary | ICD-10-CM | POA: Diagnosis not present

## 2016-08-03 DIAGNOSIS — R52 Pain, unspecified: Secondary | ICD-10-CM

## 2016-08-03 LAB — CBC
HCT: 41 % (ref 36.0–46.0)
Hemoglobin: 13 g/dL (ref 12.0–15.0)
MCH: 26.2 pg (ref 26.0–34.0)
MCHC: 31.7 g/dL (ref 30.0–36.0)
MCV: 82.7 fL (ref 78.0–100.0)
PLATELETS: 218 10*3/uL (ref 150–400)
RBC: 4.96 MIL/uL (ref 3.87–5.11)
RDW: 16 % — AB (ref 11.5–15.5)
WBC: 11.6 10*3/uL — ABNORMAL HIGH (ref 4.0–10.5)

## 2016-08-03 LAB — URINALYSIS, ROUTINE W REFLEX MICROSCOPIC
GLUCOSE, UA: NEGATIVE mg/dL
Ketones, ur: NEGATIVE mg/dL
Nitrite: NEGATIVE
PROTEIN: 30 mg/dL — AB
Specific Gravity, Urine: 1.027 (ref 1.005–1.030)
pH: 6 (ref 5.0–8.0)

## 2016-08-03 LAB — COMPREHENSIVE METABOLIC PANEL
ALK PHOS: 50 U/L (ref 38–126)
ALT: 13 U/L — AB (ref 14–54)
AST: 21 U/L (ref 15–41)
Albumin: 3.9 g/dL (ref 3.5–5.0)
Anion gap: 7 (ref 5–15)
BUN: 5 mg/dL — AB (ref 6–20)
CALCIUM: 9.5 mg/dL (ref 8.9–10.3)
CO2: 24 mmol/L (ref 22–32)
CREATININE: 0.73 mg/dL (ref 0.44–1.00)
Chloride: 105 mmol/L (ref 101–111)
GFR calc non Af Amer: >60 mL/min (ref >60)
GLUCOSE: 86 mg/dL (ref 65–99)
Potassium: 3.5 mmol/L (ref 3.5–5.1)
SODIUM: 136 mmol/L (ref 135–145)
Total Bilirubin: 1 mg/dL (ref 0.3–1.2)
Total Protein: 8 g/dL (ref 6.5–8.1)

## 2016-08-03 LAB — URINE MICROSCOPIC-ADD ON

## 2016-08-03 LAB — I-STAT BETA HCG BLOOD, ED (MC, WL, AP ONLY): HCG, QUANTITATIVE: 77.8 m[IU]/mL — AB (ref <5)

## 2016-08-03 LAB — LIPASE, BLOOD: Lipase: 28 U/L (ref 11–51)

## 2016-08-03 MED ORDER — PANTOPRAZOLE SODIUM 40 MG IV SOLR
40.0000 mg | Freq: Once | INTRAVENOUS | Status: AC
  Administered 2016-08-03: 40 mg via INTRAVENOUS
  Filled 2016-08-03: qty 40

## 2016-08-03 MED ORDER — SODIUM CHLORIDE 0.9 % IV BOLUS (SEPSIS)
1000.0000 mL | Freq: Once | INTRAVENOUS | Status: AC
  Administered 2016-08-03: 1000 mL via INTRAVENOUS

## 2016-08-03 MED ORDER — MORPHINE SULFATE (PF) 4 MG/ML IV SOLN
4.0000 mg | Freq: Once | INTRAVENOUS | Status: AC
  Administered 2016-08-03: 4 mg via INTRAVENOUS
  Filled 2016-08-03: qty 1

## 2016-08-03 MED ORDER — ONDANSETRON HCL 4 MG/2ML IJ SOLN
4.0000 mg | Freq: Once | INTRAMUSCULAR | Status: AC
  Administered 2016-08-03: 4 mg via INTRAVENOUS
  Filled 2016-08-03: qty 2

## 2016-08-03 MED ORDER — PROMETHAZINE HCL 25 MG/ML IJ SOLN
12.5000 mg | Freq: Once | INTRAMUSCULAR | Status: AC
  Administered 2016-08-03: 12.5 mg via INTRAVENOUS
  Filled 2016-08-03: qty 1

## 2016-08-03 MED ORDER — DOXYLAMINE-PYRIDOXINE 10-10 MG PO TBEC: 1.0000 | DELAYED_RELEASE_TABLET | Freq: Four times a day (QID) | ORAL | 1 refills | Status: DC | PRN

## 2016-08-03 NOTE — ED Notes (Signed)
Patient transported to Ultrasound 

## 2016-08-03 NOTE — ED Triage Notes (Signed)
Pt reports severe stomach cramping and 2 episodes of throwing up blood. Pt states the blood was mixed with mucous. Pt also reports a nose bleed. Pt reports hx of IBS

## 2016-08-03 NOTE — Discharge Instructions (Signed)
Your pregnancy is too early to be visualized on ultrasound.  Need a repeat hormone level in 3 days as part of a continued evaluation to ensure you do not have a pregnancy in your fallopian tube or an ectopic pregnancy  Recheck 3 days at [facility] for repeat hormone testing

## 2016-08-03 NOTE — ED Provider Notes (Signed)
[facility]-EMERGENCY DEPT Provider Note   CSN: [id number] Arrival date & time: 08/03/16  1555     History   Chief Complaint Chief Complaint  Patient presents with  . Abdominal Pain  . Hematemesis    HPI Emily Callahan is a 20 y.o. female.Chief complaint of abdominal pain and vomiting.  She has history of IBS. Takes linens S for this. For the last 4-5 days and noticed some epigastric abdominal pain made worse with by mouth intake. This morning had an episode of emesis. On her second episode she had some dry heaving and had been 2 episodes of hematemesis. Also mixed with mucus. No bloody or melanotic stools. Not lightheaded or dizzy. She smokes. She smokes marijuana once or twice per day. Does not use excessive anti-inflammatories. States she drank more over the holidays and is typical for her is not a daily drinker. Does not use excessive caffeine. No history of gastritis or ulcer.  HPI  Past Medical History:  Diagnosis Date  . Asthma   . IBS (irritable bowel syndrome)     Patient Active Problem List   Diagnosis Date Noted  . Asthma     History reviewed. No pertinent surgical history.  OB History    Gravida Para Term Preterm AB Living   0 0 0 0 0 0   SAB TAB Ectopic Multiple Live Births   0 0 0 0         Home Medications    Prior to Admission medications   Medication Sig Start Date End Date Taking? Authorizing Provider  albuterol (PROVENTIL HFA;VENTOLIN HFA) 108 (90 BASE) MCG/ACT inhaler Inhale 2 puffs into the lungs every 6 (six) hours as needed for wheezing or shortness of breath.   Yes Historical Provider, MD  cyclobenzaprine (FLEXERIL) 10 MG tablet Take 1 tablet (10 mg total) by mouth 2 (two) times daily as needed for muscle spasms. Patient not taking: Reported on 08/03/2016 02/26/16   Bethel BornKelly Marie Gekas, PA-C  dicyclomine (BENTYL) 20 MG tablet Take 1 tablet (20 mg total) by mouth 2 (two) times daily. Patient not taking: Reported on 08/03/2016 02/26/16   Bethel BornKelly Marie  Gekas, PA-C  Doxylamine-Pyridoxine 10-10 MG TBEC Take 1 tablet by mouth every 6 (six) hours as needed (nausea). 08/03/16   Rolland PorterMark Sabre Romberger, MD  metroNIDAZOLE (FLAGYL) 500 MG tablet Take 1 tablet (500 mg total) by mouth 2 (two) times daily. Patient not taking: Reported on 08/03/2016 05/18/16   Janne NapoleonHope M Neese, NP  metroNIDAZOLE (FLAGYL) 500 MG tablet Take 1 tablet (500 mg total) by mouth 2 (two) times daily. Patient not taking: Reported on 08/03/2016 05/25/16   Samantha Tripp Dowless, PA-C  ondansetron (ZOFRAN) 4 MG tablet Take 1 tablet (4 mg total) by mouth every 6 (six) hours. Patient not taking: Reported on 08/03/2016 02/26/16   Bethel BornKelly Marie Gekas, PA-C    Family History Family History  Problem Relation Age of Onset  . Hypertension Mother   . Cancer Mother   . Stroke Mother     Social History Social History  Substance Use Topics  . Smoking status: Never Smoker  . Smokeless tobacco: Never Used  . Alcohol use Yes     Comment: occasional     Allergies   Patient has no known allergies.   Review of Systems Review of Systems  Constitutional: Negative for appetite change, chills, diaphoresis, fatigue and fever.  HENT: Negative for mouth sores, sore throat and trouble swallowing.   Eyes: Negative for visual disturbance.  Respiratory: Negative  for cough, chest tightness, shortness of breath and wheezing.   Cardiovascular: Negative for chest pain.  Gastrointestinal: Positive for abdominal pain, diarrhea, nausea and vomiting. Negative for abdominal distention.  Endocrine: Negative for polydipsia, polyphagia and polyuria.  Genitourinary: Negative for dysuria, frequency and hematuria.  Musculoskeletal: Negative for gait problem.  Skin: Negative for color change, pallor and rash.  Neurological: Negative for dizziness, syncope, light-headedness and headaches.  Hematological: Does not bruise/bleed easily.  Psychiatric/Behavioral: Negative for behavioral problems and confusion.     Physical  Exam Updated Vital Signs BP 115/58 (BP Location: Right Arm)   Pulse 98   Temp 98.7 F (37.1 C) (Oral)   Resp 18   Ht 5\' 4"  (1.626 m)   Wt 148 lb 1 oz (67.2 kg)   LMP 07/08/2016 (Within Days)   SpO2 100%   BMI 25.41 kg/m   Physical Exam  Constitutional: She is oriented to person, place, and time. She appears well-developed and well-nourished. No distress.  HENT:  Head: Normocephalic.  Eyes: Conjunctivae are normal. Pupils are equal, round, and reactive to light. No scleral icterus.  No scleral icterus. Conjunctiva not pale.  Neck: Normal range of motion. Neck supple. No thyromegaly present.  Cardiovascular: Normal rate and regular rhythm.  Exam reveals no gallop and no friction rub.   No murmur heard. Pulmonary/Chest: Effort normal and breath sounds normal. No respiratory distress. She has no wheezes. She has no rales.  Abdominal: Soft. Bowel sounds are normal. She exhibits no distension. There is tenderness in the epigastric area. There is no rebound.  Minimal tenderness in epigastrium. No guarding rebound or peritoneal irritation. Negative Murphy sign.  Musculoskeletal: Normal range of motion.  Neurological: She is alert and oriented to person, place, and time.  Skin: Skin is warm and dry. No rash noted.  Psychiatric: She has a normal mood and affect. Her behavior is normal.     ED Treatments / Results  Labs (all labs ordered are listed, but only abnormal results are displayed) Labs Reviewed  COMPREHENSIVE METABOLIC PANEL - Abnormal; Notable for the following:       Result Value   BUN 5 (*)    ALT 13 (*)    All other components within normal limits  CBC - Abnormal; Notable for the following:    WBC 11.6 (*)    RDW 16.0 (*)    All other components within normal limits  URINALYSIS, ROUTINE W REFLEX MICROSCOPIC (NOT AT [facility]) - Abnormal; Notable for the following:    Color, Urine AMBER (*)    APPearance HAZY (*)    Hgb urine dipstick LARGE (*)    Bilirubin Urine SMALL  (*)    Protein, ur 30 (*)    Leukocytes, UA SMALL (*)    All other components within normal limits  URINE MICROSCOPIC-ADD ON - Abnormal; Notable for the following:    Squamous Epithelial / LPF 6-30 (*)    Bacteria, UA MANY (*)    All other components within normal limits  I-STAT BETA HCG BLOOD, ED ([facility], [facility], [facility] ONLY) - Abnormal; Notable for the following:    I-stat hCG, quantitative 77.8 (*)    All other components within normal limits  LIPASE, BLOOD    EKG  EKG Interpretation None       Radiology US Ob Comp Less 14 Wks  Result Date: 08/03/2016 CLINICAL DATA:  Right-sided to midline pelvic pain. No bleeding. Last menstrual period 07/08/2016. Quantitative beta HCG 78 EXAM: OBSTETRIC <14 WK ULTRASOUND TECHNIQUE:  Transabdominal ultrasound was performed for evaluation of the gestation as well as the maternal uterus and adnexal regions. COMPARISON:  None. FINDINGS: Intrauterine gestational sac: None Yolk sac:  Not Visualized. Embryo:  Not Visualized. Cardiac Activity: Not Visualized. Heart Rate: Not applicable Subchorionic hemorrhage:  None visualized. Maternal uterus/adnexae: The uterus is anteverted without focal mass. The endometrial stripe is approximately 17 mm in thickness. Right ovary is unremarkable. The left ovary contains an avascular mixed echogenicity mass measuring 1.4 x 1.5 x 1.4 cm. IMPRESSION: No intrauterine or ectopic pregnancy identified. Complex left ovarian avascular mass likely representing a corpus luteal cyst or hemorrhagic cyst measuring 1.4 x 1.5 x 1.4 cm. Electronically Signed   By: Tollie Ethavid  Kwon M.D.   On: 08/03/2016 23:12   [place] Ob Transvaginal  Result Date: 08/03/2016 CLINICAL DATA:  Right-sided to midline pelvic pain. No bleeding. Last menstrual period 07/08/2016. Quantitative beta HCG 78 EXAM: OBSTETRIC <14 WK ULTRASOUND TECHNIQUE: Transabdominal ultrasound was performed for evaluation of the gestation as well as the maternal uterus and adnexal regions. COMPARISON:   None. FINDINGS: Intrauterine gestational sac: None Yolk sac:  Not Visualized. Embryo:  Not Visualized. Cardiac Activity: Not Visualized. Heart Rate: Not applicable Subchorionic hemorrhage:  None visualized. Maternal uterus/adnexae: The uterus is anteverted without focal mass. The endometrial stripe is approximately 17 mm in thickness. Right ovary is unremarkable. The left ovary contains an avascular mixed echogenicity mass measuring 1.4 x 1.5 x 1.4 cm. IMPRESSION: No intrauterine or ectopic pregnancy identified. Complex left ovarian avascular mass likely representing a corpus luteal cyst or hemorrhagic cyst measuring 1.4 x 1.5 x 1.4 cm. Electronically Signed   By: Tollie Ethavid  Kwon M.D.   On: 08/03/2016 23:12    Procedures Procedures (including critical care time)  Medications Ordered in ED Medications  morphine 4 MG/ML injection 4 mg (4 mg Intravenous Given 08/03/16 2023)  pantoprazole (PROTONIX) injection 40 mg (40 mg Intravenous Given 08/03/16 2023)  ondansetron (ZOFRAN) injection 4 mg (4 mg Intravenous Given 08/03/16 2023)  sodium chloride 0.9 % bolus 1,000 mL (0 mLs Intravenous Stopped 08/03/16 2304)  promethazine (PHENERGAN) injection 12.5 mg (12.5 mg Intravenous Given 08/03/16 2304)     Initial Impression / Assessment and Plan / ED Course  I have reviewed the triage vital signs and the nursing notes.  Pertinent labs & imaging results that were available during my care of the patient were reviewed by me and considered in my medical decision making (see chart for details).  Clinical Course     Likely gastritis and Mallory-Weiss tear from emesis. Plan will be symptom and a medic control. Will be given morphine and 1 dose IV, Zofran, Protonix. Lab evaluation shows no abnormality of hemoglobin or hepatobiliary or pancreas enzymes. Likely can try by mouth, plan outpatient treatment. Zofran when necessary. PPI. Primary care follow-up.  Patient states he does is positive. Quant 77. Ultrasound does  not visualize pregnancy. I discussed with patient the importance of follow-up in 72 hours to recheck warm on level. We'll use Diclegis for nausea.  Final Clinical Impressions(s) / ED Diagnoses   Final diagnoses:  Less than [redacted] weeks gestation of pregnancy  Nausea    New Prescriptions New Prescriptions   DOXYLAMINE-PYRIDOXINE 10-10 MG TBEC    Take 1 tablet by mouth every 6 (six) hours as needed (nausea).     Rolland PorterMark Alvon Nygaard, MD 08/03/16 [phone]

## 2016-08-07 ENCOUNTER — Other Ambulatory Visit: Payer: BLUE CROSS/BLUE SHIELD

## 2016-08-07 DIAGNOSIS — Z3490 Encounter for supervision of normal pregnancy, unspecified, unspecified trimester: Secondary | ICD-10-CM

## 2016-08-08 LAB — HCG, QUANTITATIVE, PREGNANCY: HCG, BETA CHAIN, QUANT, S: 171 m[IU]/mL — AB

## 2016-08-11 NOTE — Progress Notes (Signed)
Pt coming on Friday for repeat hcg level. Pt informed of plan and will follow up according to results. No further questions or concerns.

## 2016-08-14 ENCOUNTER — Other Ambulatory Visit: Payer: BLUE CROSS/BLUE SHIELD

## 2016-08-14 DIAGNOSIS — Z3490 Encounter for supervision of normal pregnancy, unspecified, unspecified trimester: Secondary | ICD-10-CM

## 2016-08-15 LAB — HCG, QUANTITATIVE, PREGNANCY: hCG, Beta Chain, Quant, S: 3192 m[IU]/mL — ABNORMAL HIGH

## 2016-09-07 NOTE — L&D Delivery Note (Signed)
21 y.o. G1P0000 at 6630w6d admitted for SOL with AROM delivered a viable female infant at 2154 in cephalic, LOA position. No nuchal cord. Right anterior shoulder delivered with ease. 60 sec delayed cord clamping. Cord clamped x2 and cut. Placenta delivered spontaneously intact, with 3VC. Fundus firm on exam with massage and pitocin. Good hemostasis noted.  Anesthesia: Epidural Laceration: none Suture: N?A Good hemostasis noted. EBL: 50 cc  Mom and baby recovering in LDR.    Apgars: APGAR (1 MIN): 8   APGAR (5 MINS): 9   Weight: Pending skin to skin  Sponge and instrument count were correct x2. Placenta sent to L&D. Outpatient, breast feeding, NFP for contraception.  Emily Ponciano Shealy, DO [facility] Resident PGY-1 04/06/2017 10:47 PM

## 2016-09-14 ENCOUNTER — Ambulatory Visit (INDEPENDENT_AMBULATORY_CARE_PROVIDER_SITE_OTHER): Payer: BLUE CROSS/BLUE SHIELD | Admitting: Obstetrics

## 2016-09-14 ENCOUNTER — Encounter: Payer: Self-pay | Admitting: Obstetrics

## 2016-09-14 VITALS — BP 125/75 | HR 81 | Wt 146.2 lb

## 2016-09-14 DIAGNOSIS — Z1151 Encounter for screening for human papillomavirus (HPV): Secondary | ICD-10-CM

## 2016-09-14 DIAGNOSIS — Z124 Encounter for screening for malignant neoplasm of cervix: Secondary | ICD-10-CM

## 2016-09-14 DIAGNOSIS — Z3481 Encounter for supervision of other normal pregnancy, first trimester: Secondary | ICD-10-CM

## 2016-09-14 DIAGNOSIS — Z3480 Encounter for supervision of other normal pregnancy, unspecified trimester: Secondary | ICD-10-CM

## 2016-09-14 DIAGNOSIS — Z3400 Encounter for supervision of normal first pregnancy, unspecified trimester: Secondary | ICD-10-CM

## 2016-09-14 DIAGNOSIS — Z113 Encounter for screening for infections with a predominantly sexual mode of transmission: Secondary | ICD-10-CM

## 2016-09-14 DIAGNOSIS — Z3490 Encounter for supervision of normal pregnancy, unspecified, unspecified trimester: Secondary | ICD-10-CM | POA: Insufficient documentation

## 2016-09-14 MED ORDER — VITAFOL ULTRA 29-0.6-0.4-200 MG PO CAPS: 1.0000 | ORAL_CAPSULE | Freq: Every day | ORAL | 3 refills | Status: DC

## 2016-09-14 NOTE — Progress Notes (Signed)
Subjective:    Emily Callahan is being seen today for her first obstetrical visit.  This is not a planned pregnancy. She is at [redacted]w[redacted]d gestation. Her obstetrical history is significant for none. Relationship with FOB: significant other, not living together. Patient does intend to breast feed. Pregnancy history fully reviewed.  The information documented in the HPI was reviewed and verified.  Menstrual History: OB History    Gravida Para Term Preterm AB Living   1 0 0 0 0 0   SAB TAB Ectopic Multiple Live Births   0 0 0 0        Patient's last menstrual period was 07/08/2016 (exact date).    Past Medical History:  Diagnosis Date  . Asthma   . IBS (irritable bowel syndrome)     Past Surgical History:  Procedure Laterality Date  . MOUTH SURGERY       (Not in a hospital admission) No Known Allergies  Social History  Substance Use Topics  . Smoking status: Never Smoker  . Smokeless tobacco: Never Used  . Alcohol use Yes     Comment: occasional    Family History  Problem Relation Age of Onset  . Hypertension Mother   . Cancer Mother   . Stroke Mother      Review of Systems Constitutional: negative for weight loss Gastrointestinal: negative for vomiting Genitourinary:negative for genital lesions and vaginal discharge and dysuria Musculoskeletal:negative for back pain Behavioral/Psych: negative for abusive relationship, depression, illegal drug usage and tobacco use    Objective:    BP 125/75   Pulse 81   Wt 146 lb 3.2 oz (66.3 kg)   LMP 07/08/2016 (Exact Date)   BMI 25.10 kg/m  General Appearance:    Alert, cooperative, no distress, appears stated age  Head:    Normocephalic, without obvious abnormality, atraumatic  Eyes:    PERRL, conjunctiva/corneas clear, EOM's intact, fundi    benign, both eyes  Ears:    Normal TM's and external ear canals, both ears  Nose:   Nares normal, septum midline, mucosa normal, no drainage    or sinus tenderness  Throat:   Lips,  mucosa, and tongue normal; teeth and gums normal  Neck:   Supple, symmetrical, trachea midline, no adenopathy;    thyroid:  no enlargement/tenderness/nodules; no carotid   bruit or JVD  Back:     Symmetric, no curvature, ROM normal, no CVA tenderness  Lungs:     Clear to auscultation bilaterally, respirations unlabored  Chest Wall:    No tenderness or deformity   Heart:    Regular rate and rhythm, S1 and S2 normal, no murmur, rub   or gallop  Breast Exam:    No tenderness, masses, or nipple abnormality  Abdomen:     Soft, non-tender, bowel sounds active all four quadrants,    no masses, no organomegaly  Genitalia:    Normal female without lesion, discharge or tenderness  Extremities:   Extremities normal, atraumatic, no cyanosis or edema  Pulses:   2+ and symmetric all extremities  Skin:   Skin color, texture, turgor normal, no rashes or lesions  Lymph nodes:   Cervical, supraclavicular, and axillary nodes normal  Neurologic:   CNII-XII intact, normal strength, sensation and reflexes    throughout      Lab Review Urine pregnancy test Labs reviewed yes Radiologic studies reviewed no Assessment:    Pregnancy at [redacted]w[redacted]d weeks    Plan:      Prenatal vitamins.  Counseling  provided regarding continued use of seat belts, cessation of alcohol consumption, smoking or use of illicit drugs; infection precautions i.e., influenza/TDAP immunizations, toxoplasmosis,CMV, parvovirus, listeria and varicella; workplace safety, exercise during pregnancy; routine dental care, safe medications, sexual activity, hot tubs, saunas, pools, travel, caffeine use, fish and methlymercury, potential toxins, hair treatments, varicose veins Weight gain recommendations per IOM guidelines reviewed: underweight/BMI< 18.5--> gain 28 - 40 lbs; normal weight/BMI 18.5 - 24.9--> gain 25 - 35 lbs; overweight/BMI 25 - 29.9--> gain 15 - 25 lbs; obese/BMI >30->gain  11 - 20 lbs Problem list reviewed and updated. FIRST/CF  mutation testing/NIPT/QUAD SCREEN/fragile X/Ashkenazi Jewish population testing/Spinal muscular atrophy discussed: requested. Role of ultrasound in pregnancy discussed; fetal survey: requested. Amniocentesis discussed: not indicated. VBAC calculator score: VBAC consent form provided Meds ordered this encounter  Medications  . acetaminophen (TYLENOL) 325 MG tablet    Sig: Take 650 mg by mouth every 6 (six) hours as needed.  . Prenatal Vit-Fe Fumarate-FA (MULTIVITAMIN-PRENATAL) 27-0.8 MG TABS tablet    Sig: Take 1 tablet by mouth daily at 12 noon.  . Prenat-Fe Poly-Methfol-FA-DHA (VITAFOL ULTRA) 29-0.6-0.4-200 MG CAPS    Sig: Take 1 capsule by mouth daily before breakfast.    Dispense:  90 capsule    Refill:  3   Orders Placed This Encounter  Procedures  . Culture, OB Urine  . Prenatal Profile I  . HIV antibody  . Hemoglobinopathy evaluation  . Varicella zoster antibody, IgG  . VITAMIN D 25 Hydroxy (Vit-D Deficiency, Fractures)  . ToxASSURE Select 13 (MW), Urine    Follow up in 4 weeks. 50% of 20 min visit spent on counseling and coordination of care. Patient ID: Emily Callahan, female   DOB: 09/08/1995, 21 y.o.   MRN: [id number]

## 2016-09-14 NOTE — Progress Notes (Signed)
Patient has no concerns today. 

## 2016-09-16 LAB — PRENATAL PROFILE I(LABCORP)
ANTIBODY SCREEN: NEGATIVE
BASOS: 0 %
Basophils Absolute: 0 10*3/uL (ref 0.0–0.2)
EOS (ABSOLUTE): 0.1 10*3/uL (ref 0.0–0.4)
Eos: 1 %
HEMATOCRIT: 36.8 % (ref 34.0–46.6)
HEMOGLOBIN: 12 g/dL (ref 11.1–15.9)
Hepatitis B Surface Ag: NEGATIVE
IMMATURE GRANS (ABS): 0 10*3/uL (ref 0.0–0.1)
Immature Granulocytes: 0 %
LYMPHS ABS: 2.3 10*3/uL (ref 0.7–3.1)
LYMPHS: 23 %
MCH: 27 pg (ref 26.6–33.0)
MCHC: 32.6 g/dL (ref 31.5–35.7)
MCV: 83 fL (ref 79–97)
MONOS ABS: 0.7 10*3/uL (ref 0.1–0.9)
Monocytes: 7 %
NEUTROS PCT: 69 %
Neutrophils Absolute: 7 10*3/uL (ref 1.4–7.0)
Platelets: 260 10*3/uL (ref 150–379)
RBC: 4.44 x10E6/uL (ref 3.77–5.28)
RDW: 16.1 % — AB (ref 12.3–15.4)
RH TYPE: POSITIVE
RPR: NONREACTIVE
Rubella Antibodies, IGG: 11.2 index (ref >0.99)
WBC: 10.1 10*3/uL (ref 3.4–10.8)

## 2016-09-16 LAB — HEMOGLOBINOPATHY EVALUATION
HGB C: 0 %
HGB S: 0 %
HGB VARIANT: 0 %
Hemoglobin A2 Quantitation: 2.5 % (ref 1.8–3.2)
Hemoglobin F Quantitation: 0 % (ref 0.0–2.0)
Hgb A: 97.5 % (ref 96.4–98.8)

## 2016-09-16 LAB — HIV ANTIBODY (ROUTINE TESTING W REFLEX): HIV SCREEN 4TH GENERATION: NONREACTIVE

## 2016-09-16 LAB — VARICELLA ZOSTER ANTIBODY, IGG

## 2016-09-16 LAB — CERVICOVAGINAL ANCILLARY ONLY
BACTERIAL VAGINITIS: POSITIVE — AB
CANDIDA VAGINITIS: NEGATIVE
Chlamydia: NEGATIVE
Neisseria Gonorrhea: NEGATIVE
TRICH (WINDOWPATH): NEGATIVE

## 2016-09-16 LAB — CYTOLOGY - PAP
Diagnosis: UNDETERMINED — AB
HPV: DETECTED — AB

## 2016-09-16 LAB — CULTURE, OB URINE

## 2016-09-16 LAB — VITAMIN D 25 HYDROXY (VIT D DEFICIENCY, FRACTURES): VIT D 25 HYDROXY: 20.2 ng/mL — AB (ref 30.0–100.0)

## 2016-09-16 LAB — URINE CULTURE, OB REFLEX

## 2016-09-17 ENCOUNTER — Encounter: Payer: Self-pay | Admitting: *Deleted

## 2016-09-17 ENCOUNTER — Other Ambulatory Visit: Payer: Self-pay | Admitting: Obstetrics

## 2016-09-17 DIAGNOSIS — B3731 Acute candidiasis of vulva and vagina: Secondary | ICD-10-CM

## 2016-09-17 DIAGNOSIS — B373 Candidiasis of vulva and vagina: Secondary | ICD-10-CM

## 2016-09-17 DIAGNOSIS — N760 Acute vaginitis: Principal | ICD-10-CM

## 2016-09-17 DIAGNOSIS — B9689 Other specified bacterial agents as the cause of diseases classified elsewhere: Secondary | ICD-10-CM

## 2016-09-17 MED ORDER — TINIDAZOLE 500 MG PO TABS: 1000.0000 mg | ORAL_TABLET | Freq: Every day | ORAL | 2 refills | Status: DC

## 2016-09-19 LAB — TOXASSURE SELECT 13 (MW), URINE

## 2016-09-29 ENCOUNTER — Encounter: Payer: Self-pay | Admitting: Obstetrics

## 2016-09-30 ENCOUNTER — Other Ambulatory Visit: Payer: Self-pay | Admitting: *Deleted

## 2016-09-30 DIAGNOSIS — B9689 Other specified bacterial agents as the cause of diseases classified elsewhere: Secondary | ICD-10-CM

## 2016-09-30 DIAGNOSIS — N760 Acute vaginitis: Principal | ICD-10-CM

## 2016-09-30 MED ORDER — TINIDAZOLE 500 MG PO TABS: 1000.0000 mg | ORAL_TABLET | Freq: Every day | ORAL | 2 refills | Status: DC

## 2016-09-30 NOTE — Progress Notes (Signed)
Pt called to office re: Rx that was sent.   Return call to pt. Pt states that she had Rx sent for BV. Pt states she did not know Rx was at pharmacy. Pt was previously notified via MyChart. Pt states that her pharmacy will only hold Rx for 10 days and she was told she needs new Rx. Tinidazole was reordered as previously sent. Pt advised to call if any problems getting Rx.

## 2016-10-06 ENCOUNTER — Telehealth: Payer: Self-pay | Admitting: *Deleted

## 2016-10-06 NOTE — Telephone Encounter (Signed)
Call placed to pt to discuss PNV Rx. Pt made aware that PNV that was sent by office was not covered by insurance. Pt made aware she may call ins company to ask what is covered or she could buy OTC prenatal. Pt states she has bought OTC prenatal and will bring at next visit.

## 2016-10-12 ENCOUNTER — Ambulatory Visit (INDEPENDENT_AMBULATORY_CARE_PROVIDER_SITE_OTHER): Payer: BLUE CROSS/BLUE SHIELD | Admitting: Obstetrics

## 2016-10-12 ENCOUNTER — Encounter: Payer: Self-pay | Admitting: Obstetrics

## 2016-10-12 VITALS — BP 114/69 | HR 83 | Wt 142.0 lb

## 2016-10-12 DIAGNOSIS — O331 Maternal care for disproportion due to generally contracted pelvis: Secondary | ICD-10-CM | POA: Diagnosis not present

## 2016-10-12 DIAGNOSIS — Z3490 Encounter for supervision of normal pregnancy, unspecified, unspecified trimester: Secondary | ICD-10-CM

## 2016-10-12 DIAGNOSIS — Z23 Encounter for immunization: Secondary | ICD-10-CM

## 2016-10-12 DIAGNOSIS — Z3480 Encounter for supervision of other normal pregnancy, unspecified trimester: Secondary | ICD-10-CM

## 2016-10-12 DIAGNOSIS — Z3481 Encounter for supervision of other normal pregnancy, first trimester: Secondary | ICD-10-CM

## 2016-10-12 NOTE — Progress Notes (Signed)
Subjective:  Emily Callahan is a 21 y.o. G1P0000 at 4982w5d being seen today for ongoing prenatal care.  She is currently monitored for the following issues for this low-risk pregnancy and has Asthma and Supervision of normal pregnancy, antepartum on her problem list.  Patient reports backache.  Contractions: Not present. Vag. Bleeding: None.  Movement: Present. Denies leaking of fluid.   The following portions of the patient's history were reviewed and updated as appropriate: allergies, current medications, past family history, past medical history, past social history, past surgical history and problem list. Problem list updated.  Objective:   Vitals:   10/12/16 0913  BP: 114/69  Pulse: 83  Weight: 142 lb (64.4 kg)    Fetal Status:     Movement: Present     General:  Alert, oriented and cooperative. Patient is in no acute distress.  Skin: Skin is warm and dry. No rash noted.   Cardiovascular: Normal heart rate noted  Respiratory: Normal respiratory effort, no problems with respiration noted  Abdomen: Soft, gravid, appropriate for gestational age. Pain/Pressure: Present     Pelvic:  Cervical exam deferred        Extremities: Normal range of motion.  Edema: None  Mental Status: Normal mood and affect. Normal behavior. Normal judgment and thought content.   Urinalysis:      Assessment and Plan:  Pregnancy: G1P0000 at 4682w5d  1. Encounter for supervision of normal pregnancy, antepartum, unspecified gravidity Backache:  Maternity Belt Rx  Preterm labor symptoms and general obstetric precautions including but not limited to vaginal bleeding, contractions, leaking of fluid and fetal movement were reviewed in detail with the patient. Please refer to After Visit Summary for other counseling recommendations.  F/U 2 weeks   Brock Badharles A Orlyn Odonoghue, MDPatient ID: Emily Callahan, female   DOB: 07/11/1996, 21 y.o.   MRN: [id number]

## 2016-10-25 ENCOUNTER — Emergency Department (HOSPITAL_COMMUNITY)
Admission: EM | Admit: 2016-10-25 | Discharge: 2016-10-25 | Disposition: A | Discharge status: Home / Self Care | Payer: BLUE CROSS/BLUE SHIELD | Attending: Emergency Medicine | Admitting: Emergency Medicine

## 2016-10-25 ENCOUNTER — Encounter (HOSPITAL_COMMUNITY): Payer: Self-pay | Admitting: Emergency Medicine

## 2016-10-25 DIAGNOSIS — J45909 Unspecified asthma, uncomplicated: Secondary | ICD-10-CM | POA: Diagnosis not present

## 2016-10-25 DIAGNOSIS — M545 Low back pain, unspecified: Secondary | ICD-10-CM

## 2016-10-25 DIAGNOSIS — O9989 Other specified diseases and conditions complicating pregnancy, childbirth and the puerperium: Secondary | ICD-10-CM | POA: Diagnosis not present

## 2016-10-25 DIAGNOSIS — Z79899 Other long term (current) drug therapy: Secondary | ICD-10-CM | POA: Diagnosis not present

## 2016-10-25 DIAGNOSIS — Z3A15 15 weeks gestation of pregnancy: Secondary | ICD-10-CM | POA: Insufficient documentation

## 2016-10-25 HISTORY — DX: Encounter for supervision of normal pregnancy, unspecified, unspecified trimester: Z34.90

## 2016-10-25 LAB — URINALYSIS, ROUTINE W REFLEX MICROSCOPIC
Bilirubin Urine: NEGATIVE
Glucose, UA: NEGATIVE mg/dL
Hgb urine dipstick: NEGATIVE
Ketones, ur: 5 mg/dL — AB
Leukocytes, UA: NEGATIVE
Nitrite: NEGATIVE
Protein, ur: 30 mg/dL — AB
Specific Gravity, Urine: 1.026 (ref 1.005–1.030)
pH: 5 (ref 5.0–8.0)

## 2016-10-25 MED ORDER — CYCLOBENZAPRINE HCL 10 MG PO TABS
10.0000 mg | ORAL_TABLET | Freq: Once | ORAL | Status: AC
  Administered 2016-10-25: 10 mg via ORAL
  Filled 2016-10-25: qty 1

## 2016-10-25 MED ORDER — CYCLOBENZAPRINE HCL 10 MG PO TABS: 10.0000 mg | ORAL_TABLET | Freq: Two times a day (BID) | ORAL | 0 refills | Status: DC | PRN

## 2016-10-25 MED ORDER — METOCLOPRAMIDE HCL 10 MG PO TABS
5.0000 mg | ORAL_TABLET | Freq: Once | ORAL | Status: AC
  Administered 2016-10-25: 5 mg via ORAL
  Filled 2016-10-25: qty 1

## 2016-10-25 MED ORDER — METOCLOPRAMIDE HCL 5 MG PO TABS: 5.0000 mg | ORAL_TABLET | Freq: Four times a day (QID) | ORAL | 0 refills | Status: DC

## 2016-10-25 NOTE — ED Triage Notes (Signed)
Pt. Stated, Im pregnant and My back is locking up and nausea. I know that the nausea is normal but my back keeps locking up.

## 2016-10-25 NOTE — ED Provider Notes (Signed)
[facility]-EMERGENCY DEPT Provider Note   CSN: [id number] Arrival date & time: 10/25/16  [id number]     History   Chief Complaint Chief Complaint  Patient presents with  . Back Pain  . Nausea    HPI Emily Callahan is a 21 y.o. female.  HPI Emily Callahan is a 21 y.o. female presents to Emergency department with complaint of back pain and nausea. Patient states she is [redacted] weeks pregnant. She is followed by Dr. Clearance CootsHarper with OB/GYN. She states she has been nauseated and type pregnancy but reports symptoms got worse in the last 3 days. She also is complaining of worsening chronic back pain. She states back pain started even prior to becoming pregnant. States pain is in the lower back. Does not radiate. She does report some intermittent numbness sensation in bilateral legs. She states pain got worse in the last few weeks. She was seen by Dr. Clearance CootsHarper 2 weeks ago, at that time was given maternity belt her back pain. She states she is wearing the belt but it is not helping. She is taking Tylenol which only provides partial relief. She denies any abdominal pain. No vaginal discharge or bleeding. She denies any fever or chills. No urinary symptoms. She states that her nausea has also worsened in the last several days. Denies any vomiting. Not taking any anti-emetics.  Past Medical History:  Diagnosis Date  . Asthma   . IBS (irritable bowel syndrome)   . Pregnant     Patient Active Problem List   Diagnosis Date Noted  . Supervision of normal pregnancy, antepartum 09/14/2016  . Asthma     Past Surgical History:  Procedure Laterality Date  . MOUTH SURGERY      OB History    Gravida Para Term Preterm AB Living   1 0 0 0 0 0   SAB TAB Ectopic Multiple Live Births   0 0 0 0         Home Medications    Prior to Admission medications   Medication Sig Start Date End Date Taking? Authorizing Provider  acetaminophen (TYLENOL) 325 MG tablet Take 650 mg by mouth every 6 (six) hours as needed.     Historical Provider, MD  Prenat-Fe Poly-Methfol-FA-DHA (VITAFOL ULTRA) 29-0.6-0.4-200 MG CAPS Take 1 capsule by mouth daily before breakfast. 09/14/16   Brock Badharles A Harper, MD  Prenatal Vit-Fe Fumarate-FA (MULTIVITAMIN-PRENATAL) 27-0.8 MG TABS tablet Take 1 tablet by mouth daily at 12 noon.    Historical Provider, MD  tinidazole (TINDAMAX) 500 MG tablet Take 2 tablets (1,000 mg total) by mouth daily with breakfast. Patient not taking: Reported on 10/12/2016 09/30/16   Brock Badharles A Harper, MD    Family History Family History  Problem Relation Age of Onset  . Hypertension Mother   . Cancer Mother   . Stroke Mother     Social History Social History  Substance Use Topics  . Smoking status: Never Smoker  . Smokeless tobacco: Never Used  . Alcohol use Yes     Comment: occasional     Allergies   Patient has no known allergies.   Review of Systems Review of Systems  Constitutional: Negative for chills and fever.  Respiratory: Negative for cough, chest tightness and shortness of breath.   Cardiovascular: Negative for chest pain, palpitations and leg swelling.  Gastrointestinal: Negative for abdominal pain, diarrhea, nausea and vomiting.  Genitourinary: Negative for dysuria, flank pain, pelvic pain, vaginal bleeding, vaginal discharge and vaginal pain.  Musculoskeletal: Positive for back pain  and myalgias. Negative for neck pain and neck stiffness.  Skin: Negative for rash.  Neurological: Positive for numbness. Negative for dizziness, weakness and headaches.  All other systems reviewed and are negative.    Physical Exam Updated Vital Signs BP 109/76   Pulse 85   Temp 99 F (37.2 C) (Oral)   Resp 16   Ht 5\' 4"  (1.626 m)   Wt 64 kg   LMP 07/08/2016 (Exact Date)   SpO2 99%   BMI 24.20 kg/m   Physical Exam  Constitutional: She is oriented to person, place, and time. She appears well-developed and well-nourished. No distress.  HENT:  Head: Normocephalic.  Eyes: Conjunctivae are  normal.  Neck: Neck supple.  Cardiovascular: Normal rate, regular rhythm and normal heart sounds.   Pulmonary/Chest: Effort normal and breath sounds normal. No respiratory distress. She has no wheezes. She has no rales.  Abdominal: Soft. Bowel sounds are normal. She exhibits no distension. There is no tenderness. There is no rebound.  Musculoskeletal: She exhibits no edema.  Diffuse tenderness to palpation over lower back. No pain with bilateral straight leg raise.  Neurological: She is alert and oriented to person, place, and time.  5/5 and equal lower extremity strength. 2+ and equal patellar reflexes bilaterally. Pt able to dorsiflex bilateral toes and feet with good strength against resistance. Equal sensation bilaterally over thighs and lower legs.   Skin: Skin is warm and dry.  Psychiatric: She has a normal mood and affect. Her behavior is normal.  Nursing note and vitals reviewed.    ED Treatments / Results  Labs (all labs ordered are listed, but only abnormal results are displayed) Labs Reviewed  URINALYSIS, ROUTINE W REFLEX MICROSCOPIC - Abnormal; Notable for the following:       Result Value   Color, Urine AMBER (*)    APPearance CLOUDY (*)    Ketones, ur 5 (*)    Protein, ur 30 (*)    Bacteria, UA RARE (*)    Squamous Epithelial / LPF TOO NUMEROUS TO COUNT (*)    All other components within normal limits    EKG  EKG Interpretation None       Radiology No results found.  Procedures Procedures (including critical care time)  Medications Ordered in ED Medications  cyclobenzaprine (FLEXERIL) tablet 10 mg (not administered)     Initial Impression / Assessment and Plan / ED Course  I have reviewed the triage vital signs and the nursing notes.  Pertinent labs & imaging results that were available during my care of the patient were reviewed by me and considered in my medical decision making (see chart for details).    Patient with acute on chronic lower back  pain. She is approximately [redacted] weeks pregnant, with no pregnancy related complaints. Abdomen is nontender. No vaginal discharge, fluid, bleeding. Will check urinalysis. Blood pressure here is normal. Will give Flexeril and Reglan for her symptoms.  10:24 AM Urinalysis contaminated, however no signs of infection. Vital signs remained normal. I used bedside ultrasound to assess for fetal heart rate, fetal heart rate is approximately 130 bpm. Patient feels better after medications. Plan to discharge home. Continue Tylenol. Will give a list of exercises to start doing to strengthen her lower back. Patient is asking for prescriptions, will give Flexeril and Reglan. Follow-up with OB/GYN.  Vitals:   10/25/16 0736 10/25/16 0800 10/25/16 0930 10/25/16 1015  BP: 109/76 109/76 102/71 105/69  Pulse: 83 85 79 69  Resp: 16  Temp: 99 F (37.2 C)     TempSrc: Oral     SpO2: 100% 99% 100% 100%  Weight:      Height:          Final Clinical Impressions(s) / ED Diagnoses   Final diagnoses:  Bilateral low back pain without sciatica, unspecified chronicity    New Prescriptions New Prescriptions   CYCLOBENZAPRINE (FLEXERIL) 10 MG TABLET    Take 1 tablet (10 mg total) by mouth 2 (two) times daily as needed for muscle spasms.   METOCLOPRAMIDE (REGLAN) 5 MG TABLET    Take 1 tablet (5 mg total) by mouth every 6 (six) hours.     Jaynie Crumble, PA-C 10/25/16 1026    Shaune Pollack, MD 10/26/16 1921

## 2016-10-25 NOTE — Discharge Instructions (Signed)
Continue Tylenol for pain. Take Flexeril for muscle spasms as prescribed as needed. Take Reglan as prescribed as needed for nausea. Follow-up with your OB/GYN.

## 2016-10-28 ENCOUNTER — Ambulatory Visit (INDEPENDENT_AMBULATORY_CARE_PROVIDER_SITE_OTHER): Payer: BLUE CROSS/BLUE SHIELD | Admitting: Obstetrics

## 2016-10-28 ENCOUNTER — Encounter: Payer: Self-pay | Admitting: Obstetrics

## 2016-10-28 VITALS — BP 104/68 | HR 81 | Wt 146.0 lb

## 2016-10-28 DIAGNOSIS — O219 Vomiting of pregnancy, unspecified: Secondary | ICD-10-CM

## 2016-10-28 DIAGNOSIS — Z3400 Encounter for supervision of normal first pregnancy, unspecified trimester: Secondary | ICD-10-CM | POA: Diagnosis not present

## 2016-10-28 DIAGNOSIS — Z3480 Encounter for supervision of other normal pregnancy, unspecified trimester: Secondary | ICD-10-CM

## 2016-10-28 DIAGNOSIS — J302 Other seasonal allergic rhinitis: Secondary | ICD-10-CM

## 2016-10-28 DIAGNOSIS — O99512 Diseases of the respiratory system complicating pregnancy, second trimester: Secondary | ICD-10-CM

## 2016-10-28 MED ORDER — LORATADINE 10 MG PO TABS: 10.0000 mg | ORAL_TABLET | Freq: Every day | ORAL | 11 refills | Status: DC

## 2016-10-28 MED ORDER — DOXYLAMINE SUCCINATE (SLEEP) 25 MG PO TABS: 25.0000 mg | ORAL_TABLET | Freq: Every day | ORAL | 5 refills | Status: DC

## 2016-10-28 MED ORDER — VITAMIN B-6 100 MG PO TABS: 100.0000 mg | ORAL_TABLET | Freq: Every day | ORAL | 5 refills | Status: DC

## 2016-10-28 NOTE — Progress Notes (Signed)
Patient has some back pain from her pinched nerve- but other than that she is doing well

## 2016-10-28 NOTE — Progress Notes (Signed)
Subjective:  Emily Callahan is a 21 y.o. G1P0000 at 7044w0d being seen today for ongoing prenatal care.  She is currently monitored for the following issues for this low-risk pregnancy and has Asthma and Supervision of normal pregnancy, antepartum on her problem list.  Patient reports nausea and allergies.  Contractions: Not present. Vag. Bleeding: None.  Movement: Present. Denies leaking of fluid.   The following portions of the patient's history were reviewed and updated as appropriate: allergies, current medications, past family history, past medical history, past social history, past surgical history and problem list. Problem list updated.  Objective:   Vitals:   10/28/16 0937  BP: 104/68  Pulse: 81  Weight: 146 lb (66.2 kg)    Fetal Status: Fetal Heart Rate (bpm): 150   Movement: Present     General:  Alert, oriented and cooperative. Patient is in no acute distress.  Skin: Skin is warm and dry. No rash noted.   Cardiovascular: Normal heart rate noted  Respiratory: Normal respiratory effort, no problems with respiration noted  Abdomen: Soft, gravid, appropriate for gestational age. Pain/Pressure: Absent     Pelvic:  Cervical exam deferred        Extremities: Normal range of motion.  Edema: None  Mental Status: Normal mood and affect. Normal behavior. Normal judgment and thought content.   Urinalysis:      Assessment and Plan:  Pregnancy: G1P0000 at 2944w0d  1. Nausea and vomiting in pregnancy prior to [redacted] weeks gestation Rx: - doxylamine, Sleep, (UNISOM) 25 MG tablet; Take 1 tablet (25 mg total) by mouth at bedtime.  Dispense: 30 tablet; Refill: 5 - pyridOXINE (VITAMIN B-6) 100 MG tablet; Take 1 tablet (100 mg total) by mouth at bedtime.  Dispense: 30 tablet; Refill: 5  2. Acute seasonal allergic rhinitis, unspecified trigger Rx; - loratadine (CLARITIN) 10 MG tablet; Take 1 tablet (10 mg total) by mouth daily.  Dispense: 30 tablet; Refill: 11  3. Supervision of normal first  pregnancy, antepartum Rx: - US MFM OB COMP + 14 WK; Future - AFP, Quad Screen  Preterm labor symptoms and general obstetric precautions including but not limited to vaginal bleeding, contractions, leaking of fluid and fetal movement were reviewed in detail with the patient. Please refer to After Visit Summary for other counseling recommendations.  F/U 4 weeks   Brock Badharles A Leisel Pinette, MDPatient ID: Emily Callahan, female   DOB: 08/22/1996, 21 y.o.   MRN: [id number]

## 2016-11-03 ENCOUNTER — Encounter: Payer: Self-pay | Admitting: Obstetrics and Gynecology

## 2016-11-05 ENCOUNTER — Encounter (HOSPITAL_COMMUNITY): Payer: Self-pay | Admitting: Obstetrics

## 2016-11-05 LAB — AFP, QUAD SCREEN
DIA MOM VALUE: 0.58
DIA VALUE (EIA): 107.08 pg/mL
DSR (By Age)    1 IN: 1091
DSR (SECOND TRIMESTER) 1 IN: 10000
Gestational Age: 16 WEEKS
MATERNAL AGE AT EDD: 23.4 a
MSAFP MOM: 1.49
MSAFP: 54.5 ng/mL
MSHCG Mom: 1.74
MSHCG: 70572 m[IU]/mL
OSB RISK: 5617
T18 (By Age): 1:4250 {titer}
Test Results:: NEGATIVE
UE3 MOM: 1.26
UE3 VALUE: 1.04 ng/mL
WEIGHT: 146 [lb_av]

## 2016-11-16 ENCOUNTER — Encounter (HOSPITAL_COMMUNITY): Payer: Self-pay

## 2016-11-16 ENCOUNTER — Ambulatory Visit (HOSPITAL_COMMUNITY)
Admission: RE | Admit: 2016-11-16 | Discharge: 2016-11-16 | Disposition: A | Discharge status: Home / Self Care | Payer: BLUE CROSS/BLUE SHIELD | Source: Ambulatory Visit | Attending: Obstetrics | Admitting: Obstetrics

## 2016-11-16 DIAGNOSIS — J45909 Unspecified asthma, uncomplicated: Secondary | ICD-10-CM | POA: Diagnosis not present

## 2016-11-16 DIAGNOSIS — O99512 Diseases of the respiratory system complicating pregnancy, second trimester: Secondary | ICD-10-CM | POA: Insufficient documentation

## 2016-11-16 DIAGNOSIS — Z3689 Encounter for other specified antenatal screening: Secondary | ICD-10-CM | POA: Diagnosis not present

## 2016-11-16 DIAGNOSIS — Z3A18 18 weeks gestation of pregnancy: Secondary | ICD-10-CM | POA: Insufficient documentation

## 2016-11-16 DIAGNOSIS — Z3400 Encounter for supervision of normal first pregnancy, unspecified trimester: Secondary | ICD-10-CM

## 2016-11-25 ENCOUNTER — Ambulatory Visit (INDEPENDENT_AMBULATORY_CARE_PROVIDER_SITE_OTHER): Payer: BLUE CROSS/BLUE SHIELD | Admitting: Obstetrics and Gynecology

## 2016-11-25 VITALS — BP 102/64 | HR 99 | Wt 154.0 lb

## 2016-11-25 DIAGNOSIS — O219 Vomiting of pregnancy, unspecified: Secondary | ICD-10-CM | POA: Insufficient documentation

## 2016-11-25 DIAGNOSIS — Z3400 Encounter for supervision of normal first pregnancy, unspecified trimester: Secondary | ICD-10-CM

## 2016-11-25 DIAGNOSIS — Z3402 Encounter for supervision of normal first pregnancy, second trimester: Secondary | ICD-10-CM

## 2016-11-25 NOTE — Progress Notes (Signed)
Subjective:  Emily Callahan is a 21 y.o. G1P0000 at 528w0d being seen today for ongoing prenatal care.  She is currently monitored for the following issues for this low-risk pregnancy and has Asthma; Supervision of normal pregnancy, antepartum; and Nausea and vomiting in pregnancy on her problem list.  Patient reports occ episodes of vomiting but no nausea. Some round ligament pain.  Contractions: Not present. Vag. Bleeding: None.  Movement: Present. Denies leaking of fluid.   The following portions of the patient's history were reviewed and updated as appropriate: allergies, current medications, past family history, past medical history, past social history, past surgical history and problem list. Problem list updated.  Objective:   Vitals:   11/25/16 1003  BP: 102/64  Pulse: 99  Weight: 154 lb (69.9 kg)    Fetal Status: Fetal Heart Rate (bpm): 138   Movement: Present     General:  Alert, oriented and cooperative. Patient is in no acute distress.  Skin: Skin is warm and dry. No rash noted.   Cardiovascular: Normal heart rate noted  Respiratory: Normal respiratory effort, no problems with respiration noted  Abdomen: Soft, gravid, appropriate for gestational age. Pain/Pressure: Present     Pelvic:  Cervical exam deferred        Extremities: Normal range of motion.     Mental Status: Normal mood and affect. Normal behavior. Normal judgment and thought content.   Urinalysis:      Assessment and Plan:  Pregnancy: G1P0000 at 288w0d  1. Supervision of normal first pregnancy, antepartum Stable F/U U/S to complete anatomy scheduled  2. Nausea and vomiting in pregnancy Appears to be food choice related Pt has had appropriate wt gain during pregnancy Pt encouraged to watch food choices  Preterm labor symptoms and general obstetric precautions including but not limited to vaginal bleeding, contractions, leaking of fluid and fetal movement were reviewed in detail with the patient. Please  refer to After Visit Summary for other counseling recommendations.  Return in about 4 weeks (around 12/23/2016) for OB visit.   Hermina StaggersMichael L Ervin, MD

## 2016-11-25 NOTE — Patient Instructions (Signed)

## 2016-11-25 NOTE — Progress Notes (Signed)
Pt states that she is having some lower pelvic pain, abdominal tenderness. Pt states some vomiting at night, no nausea.

## 2016-12-16 ENCOUNTER — Ambulatory Visit (HOSPITAL_COMMUNITY)
Admission: RE | Admit: 2016-12-16 | Discharge: 2016-12-16 | Disposition: A | Discharge status: Home / Self Care | Payer: BLUE CROSS/BLUE SHIELD | Source: Ambulatory Visit | Attending: Obstetrics and Gynecology | Admitting: Obstetrics and Gynecology

## 2016-12-16 DIAGNOSIS — Z3400 Encounter for supervision of normal first pregnancy, unspecified trimester: Secondary | ICD-10-CM

## 2016-12-16 DIAGNOSIS — Z3402 Encounter for supervision of normal first pregnancy, second trimester: Secondary | ICD-10-CM | POA: Diagnosis not present

## 2016-12-16 DIAGNOSIS — Z3A23 23 weeks gestation of pregnancy: Secondary | ICD-10-CM | POA: Diagnosis not present

## 2016-12-16 DIAGNOSIS — O9989 Other specified diseases and conditions complicating pregnancy, childbirth and the puerperium: Secondary | ICD-10-CM | POA: Insufficient documentation

## 2016-12-16 DIAGNOSIS — J45909 Unspecified asthma, uncomplicated: Secondary | ICD-10-CM | POA: Insufficient documentation

## 2016-12-16 DIAGNOSIS — Z362 Encounter for other antenatal screening follow-up: Secondary | ICD-10-CM | POA: Diagnosis not present

## 2016-12-23 ENCOUNTER — Encounter: Payer: Self-pay | Admitting: Obstetrics

## 2016-12-23 ENCOUNTER — Ambulatory Visit (INDEPENDENT_AMBULATORY_CARE_PROVIDER_SITE_OTHER): Payer: BLUE CROSS/BLUE SHIELD | Admitting: Obstetrics

## 2016-12-23 VITALS — BP 112/78 | HR 88 | Wt 157.0 lb

## 2016-12-23 DIAGNOSIS — Z3402 Encounter for supervision of normal first pregnancy, second trimester: Secondary | ICD-10-CM

## 2016-12-23 DIAGNOSIS — Z3480 Encounter for supervision of other normal pregnancy, unspecified trimester: Secondary | ICD-10-CM

## 2016-12-23 NOTE — Progress Notes (Signed)
Subjective:  Emily Callahan is a 21 y.o. G1P0000 at [redacted]w[redacted]d being seen today for ongoing prenatal care.  She is currently monitored for the following issues for this low-risk pregnancy and has Asthma; Supervision of normal pregnancy, antepartum; and Nausea and vomiting in pregnancy on her problem list.  Patient reports no complaints.  Contractions: Not present. Vag. Bleeding: None.  Movement: Present. Denies leaking of fluid.   The following portions of the patient's history were reviewed and updated as appropriate: allergies, current medications, past family history, past medical history, past social history, past surgical history and problem list. Problem list updated.  Objective:   Vitals:   12/23/16 0951  BP: 112/78  Pulse: 88  Weight: 157 lb (71.2 kg)    Fetal Status: Fetal Heart Rate (bpm): 140   Movement: Present     General:  Alert, oriented and cooperative. Patient is in no acute distress.  Skin: Skin is warm and dry. No rash noted.   Cardiovascular: Normal heart rate noted  Respiratory: Normal respiratory effort, no problems with respiration noted  Abdomen: Soft, gravid, appropriate for gestational age. Pain/Pressure: Absent     Pelvic:  Cervical exam deferred        Extremities: Normal range of motion.     Mental Status: Normal mood and affect. Normal behavior. Normal judgment and thought content.   Urinalysis:      Assessment and Plan:  Pregnancy: G1P0000 at [redacted]w[redacted]d  There are no diagnoses linked to this encounter. Preterm labor symptoms and general obstetric precautions including but not limited to vaginal bleeding, contractions, leaking of fluid and fetal movement were reviewed in detail with the patient. Please refer to After Visit Summary for other counseling recommendations.  Return in 4 weeks (on 01/20/2017).   Brock Bad, MDPatient ID: Ronnette Hila, female   DOB: [birth year], 21 y.o.   MRN: [id number]

## 2017-01-20 ENCOUNTER — Ambulatory Visit (INDEPENDENT_AMBULATORY_CARE_PROVIDER_SITE_OTHER): Payer: BLUE CROSS/BLUE SHIELD | Admitting: Obstetrics

## 2017-01-20 ENCOUNTER — Other Ambulatory Visit: Payer: BLUE CROSS/BLUE SHIELD

## 2017-01-20 VITALS — BP 119/72 | HR 98 | Wt 158.4 lb

## 2017-01-20 DIAGNOSIS — Z23 Encounter for immunization: Secondary | ICD-10-CM | POA: Diagnosis not present

## 2017-01-20 DIAGNOSIS — Z3400 Encounter for supervision of normal first pregnancy, unspecified trimester: Secondary | ICD-10-CM

## 2017-01-20 DIAGNOSIS — Z3482 Encounter for supervision of other normal pregnancy, second trimester: Secondary | ICD-10-CM

## 2017-01-20 DIAGNOSIS — K219 Gastro-esophageal reflux disease without esophagitis: Secondary | ICD-10-CM

## 2017-01-20 DIAGNOSIS — Z3480 Encounter for supervision of other normal pregnancy, unspecified trimester: Secondary | ICD-10-CM

## 2017-01-20 MED ORDER — OMEPRAZOLE 20 MG PO CPDR: 20.0000 mg | DELAYED_RELEASE_CAPSULE | Freq: Every day | ORAL | 5 refills | Status: DC

## 2017-01-20 NOTE — Progress Notes (Signed)
Pt complains of tenderness in the ULQ. Also complains of having a lot of acid reflux. She has taken tums with no relief

## 2017-01-21 ENCOUNTER — Encounter: Payer: Self-pay | Admitting: Obstetrics

## 2017-01-21 LAB — CBC
HEMOGLOBIN: 10.5 g/dL — AB (ref 11.1–15.9)
Hematocrit: 34.7 % (ref 34.0–46.6)
MCH: 26.4 pg — ABNORMAL LOW (ref 26.6–33.0)
MCHC: 30.3 g/dL — ABNORMAL LOW (ref 31.5–35.7)
MCV: 87 fL (ref 79–97)
Platelets: 268 10*3/uL (ref 150–379)
RBC: 3.98 x10E6/uL (ref 3.77–5.28)
RDW: 13.9 % (ref 12.3–15.4)
WBC: 13.5 10*3/uL — ABNORMAL HIGH (ref 3.4–10.8)

## 2017-01-21 LAB — GLUCOSE TOLERANCE, 2 HOURS W/ 1HR
GLUCOSE, 1 HOUR: 112 mg/dL (ref 65–179)
GLUCOSE, 2 HOUR: 93 mg/dL (ref 65–152)
Glucose, Fasting: 77 mg/dL (ref 65–91)

## 2017-01-21 LAB — HIV ANTIBODY (ROUTINE TESTING W REFLEX): HIV SCREEN 4TH GENERATION: NONREACTIVE

## 2017-01-21 LAB — RPR: RPR: NONREACTIVE

## 2017-01-21 NOTE — Progress Notes (Signed)
Subjective:  Emily Callahan is a 21 y.o. G1P0000 at 2621w1d being seen today for ongoing prenatal care.  She is currently monitored for the following issues for this low-risk pregnancy and has Asthma; Supervision of normal pregnancy, antepartum; and Nausea and vomiting in pregnancy on her problem list.  Patient reports no complaints.  Contractions: Not present. Vag. Bleeding: None.  Movement: Present. Denies leaking of fluid.   The following portions of the patient's history were reviewed and updated as appropriate: allergies, current medications, past family history, past medical history, past social history, past surgical history and problem list. Problem list updated.  Objective:   Vitals:   01/20/17 0915  BP: 119/72  Pulse: 98  Weight: 158 lb 6.4 oz (71.8 kg)    Fetal Status: Fetal Heart Rate (bpm): 140   Movement: Present     General:  Alert, oriented and cooperative. Patient is in no acute distress.  Skin: Skin is warm and dry. No rash noted.   Cardiovascular: Normal heart rate noted  Respiratory: Normal respiratory effort, no problems with respiration noted  Abdomen: Soft, gravid, appropriate for gestational age. Pain/Pressure: Absent     Pelvic:  Cervical exam deferred        Extremities: Normal range of motion.  Edema: None  Mental Status: Normal mood and affect. Normal behavior. Normal judgment and thought content.   Urinalysis:      Assessment and Plan:  Pregnancy: G1P0000 at 6021w1d  1. Supervision of normal first pregnancy, antepartum Rx: - Tdap vaccine greater than or equal to 7yo IM - CBC - HIV antibody - RPR - Glucose Tolerance, 2 Hours w/1 Hour  2. GERD without esophagitis Rx: - omeprazole (PRILOSEC) 20 MG capsule; Take 1 capsule (20 mg total) by mouth daily.  Dispense: 60 capsule; Refill: 5  Preterm labor symptoms and general obstetric precautions including but not limited to vaginal bleeding, contractions, leaking of fluid and fetal movement were reviewed in  detail with the patient. Please refer to After Visit Summary for other counseling recommendations.  Return in 2 weeks (on 02/03/2017) for ROB.   Brock BadHarper, Charles A, MDPatient ID: Emily Callahan, female   DOB: 01/02/1996, 21 y.o.   MRN: [id number]

## 2017-02-03 ENCOUNTER — Ambulatory Visit (INDEPENDENT_AMBULATORY_CARE_PROVIDER_SITE_OTHER): Payer: BLUE CROSS/BLUE SHIELD | Admitting: Obstetrics

## 2017-02-03 ENCOUNTER — Encounter: Payer: Self-pay | Admitting: Obstetrics

## 2017-02-03 DIAGNOSIS — Z3400 Encounter for supervision of normal first pregnancy, unspecified trimester: Secondary | ICD-10-CM

## 2017-02-03 DIAGNOSIS — Z3403 Encounter for supervision of normal first pregnancy, third trimester: Secondary | ICD-10-CM

## 2017-02-03 NOTE — Progress Notes (Signed)
Subjective:  Emily Callahan is a 21 y.o. G1P0000 at 3456w0d being seen today for ongoing prenatal care.  She is currently monitored for the following issues for this low-risk pregnancy and has Asthma; Supervision of normal pregnancy, antepartum; and Nausea and vomiting in pregnancy on her problem list.  Patient reports no complaints.  Contractions: Not present. Vag. Bleeding: None.  Movement: Present. Denies leaking of fluid.   The following portions of the patient's history were reviewed and updated as appropriate: allergies, current medications, past family history, past medical history, past social history, past surgical history and problem list. Problem list updated.  Objective:   Vitals:   02/03/17 1411  BP: 108/72  Pulse: (!) 106  Weight: 163 lb (73.9 kg)    Fetal Status: Fetal Heart Rate (bpm): 140   Movement: Present     General:  Alert, oriented and cooperative. Patient is in no acute distress.  Skin: Skin is warm and dry. No rash noted.   Cardiovascular: Normal heart rate noted  Respiratory: Normal respiratory effort, no problems with respiration noted  Abdomen: Soft, gravid, appropriate for gestational age. Pain/Pressure: Present     Pelvic:  Cervical exam deferred        Extremities: Normal range of motion.  Edema: Trace  Mental Status: Normal mood and affect. Normal behavior. Normal judgment and thought content.   Urinalysis:      Assessment and Plan:  Pregnancy: G1P0000 at 5156w0d  1. Supervision of normal first pregnancy, antepartum   Preterm labor symptoms and general obstetric precautions including but not limited to vaginal bleeding, contractions, leaking of fluid and fetal movement were reviewed in detail with the patient. Please refer to After Visit Summary for other counseling recommendations.  Return in about 2 weeks (around 02/17/2017) for ROB.   Brock BadHarper, [name], MDPatient ID: Emily Callahan, female   DOB: 09/18/1995, 21 y.o.   MRN: [id number]

## 2017-02-09 ENCOUNTER — Encounter (HOSPITAL_COMMUNITY): Payer: Self-pay | Admitting: *Deleted

## 2017-02-09 ENCOUNTER — Inpatient Hospital Stay (HOSPITAL_COMMUNITY)
Admission: AD | Admit: 2017-02-09 | Discharge: 2017-02-09 | Disposition: A | Discharge status: Home / Self Care | Payer: BLUE CROSS/BLUE SHIELD | Source: Ambulatory Visit | Attending: Family Medicine | Admitting: Family Medicine

## 2017-02-09 DIAGNOSIS — K589 Irritable bowel syndrome without diarrhea: Secondary | ICD-10-CM | POA: Insufficient documentation

## 2017-02-09 DIAGNOSIS — J45909 Unspecified asthma, uncomplicated: Secondary | ICD-10-CM | POA: Insufficient documentation

## 2017-02-09 DIAGNOSIS — R109 Unspecified abdominal pain: Secondary | ICD-10-CM | POA: Diagnosis not present

## 2017-02-09 DIAGNOSIS — O99513 Diseases of the respiratory system complicating pregnancy, third trimester: Secondary | ICD-10-CM | POA: Diagnosis not present

## 2017-02-09 DIAGNOSIS — O26893 Other specified pregnancy related conditions, third trimester: Secondary | ICD-10-CM | POA: Diagnosis not present

## 2017-02-09 DIAGNOSIS — Z3A30 30 weeks gestation of pregnancy: Secondary | ICD-10-CM | POA: Diagnosis not present

## 2017-02-09 DIAGNOSIS — R12 Heartburn: Secondary | ICD-10-CM

## 2017-02-09 DIAGNOSIS — R079 Chest pain, unspecified: Secondary | ICD-10-CM | POA: Insufficient documentation

## 2017-02-09 DIAGNOSIS — R51 Headache: Secondary | ICD-10-CM | POA: Insufficient documentation

## 2017-02-09 DIAGNOSIS — Z79899 Other long term (current) drug therapy: Secondary | ICD-10-CM | POA: Insufficient documentation

## 2017-02-09 DIAGNOSIS — O99613 Diseases of the digestive system complicating pregnancy, third trimester: Secondary | ICD-10-CM | POA: Diagnosis not present

## 2017-02-09 LAB — URINALYSIS, ROUTINE W REFLEX MICROSCOPIC
BACTERIA UA: NONE SEEN
Bilirubin Urine: NEGATIVE
GLUCOSE, UA: NEGATIVE mg/dL
Hgb urine dipstick: NEGATIVE
KETONES UR: NEGATIVE mg/dL
Nitrite: NEGATIVE
PROTEIN: NEGATIVE mg/dL
Specific Gravity, Urine: 1.014 (ref 1.005–1.030)
pH: 6 (ref 5.0–8.0)

## 2017-02-09 MED ORDER — GI COCKTAIL ~~LOC~~
30.0000 mL | Freq: Once | ORAL | Status: AC
  Administered 2017-02-09: 30 mL via ORAL
  Filled 2017-02-09: qty 30

## 2017-02-09 MED ORDER — CYCLOBENZAPRINE HCL 10 MG PO TABS
10.0000 mg | ORAL_TABLET | Freq: Once | ORAL | Status: AC
  Administered 2017-02-09: 10 mg via ORAL
  Filled 2017-02-09: qty 1

## 2017-02-09 MED ORDER — METOCLOPRAMIDE HCL 10 MG PO TABS
5.0000 mg | ORAL_TABLET | Freq: Once | ORAL | Status: AC
  Administered 2017-02-09: 5 mg via ORAL
  Filled 2017-02-09: qty 1

## 2017-02-09 NOTE — MAU Note (Signed)
Pt reports she started having nausea and vomiting last pm and today she has continued to be nauseated and has mid and upper sternal chest pain and left upper/mid abd pain.

## 2017-02-09 NOTE — Discharge Instructions (Signed)
Heartburn Heartburn is a type of pain or discomfort that can happen in the throat or chest. It is often described as a burning pain. It may also cause a bad taste in the mouth. Heartburn may feel worse when you lie down or bend over. It may be caused by stomach contents that move back up (reflux) into the tube that connects the mouth with the stomach (esophagus). Follow these instructions at home: Take these actions to lessen your discomfort and to help avoid problems. Diet  Follow a diet as told by your doctor. You may need to avoid foods and drinks such as: ? Coffee and tea (with or without caffeine). ? Drinks that contain alcohol. ? Energy drinks and sports drinks. ? Carbonated drinks or sodas. ? Chocolate and cocoa. ? Peppermint and mint flavorings. ? Garlic and onions. ? Horseradish. ? Spicy and acidic foods, such as peppers, chili powder, curry powder, vinegar, hot sauces, and BBQ sauce. ? Citrus fruit juices and citrus fruits, such as oranges, lemons, and limes. ? Tomato-based foods, such as red sauce, chili, salsa, and pizza with red sauce. ? Fried and fatty foods, such as donuts, french fries, potato chips, and high-fat dressings. ? High-fat meats, such as hot dogs, rib eye steak, sausage, ham, and bacon. ? High-fat dairy items, such as whole milk, butter, and cream cheese.  Eat small meals often. Avoid eating large meals.  Avoid drinking large amounts of liquid with your meals.  Avoid eating meals during the 2-3 hours before bedtime.  Avoid lying down right after you eat.  Do not exercise right after you eat. General instructions  Pay attention to any changes in your symptoms.  Take over-the-counter and prescription medicines only as told by your doctor. Do not take aspirin, ibuprofen, or other NSAIDs unless your doctor says it is okay.  Do not use any tobacco products, including cigarettes, chewing tobacco, and e-cigarettes. If you need help quitting, ask your  doctor.  Wear loose clothes. Do not wear anything tight around your waist.  Raise (elevate) the head of your bed about 6 inches (15 cm).  Try to lower your stress. If you need help doing this, ask your doctor.  If you are overweight, lose an amount of weight that is healthy for you. Ask your doctor about a safe weight loss goal.  Keep all follow-up visits as told by your doctor. This is important. Contact a doctor if:  You have new symptoms.  You lose weight and you do not know why it is happening.  You have trouble swallowing, or it hurts to swallow.  You have wheezing or a cough that keeps happening.  Your symptoms do not get better with treatment.  You have heartburn often for more than two weeks. Get help right away if:  You have pain in your arms, neck, jaw, teeth, or back.  You feel sweaty, dizzy, or light-headed.  You have chest pain or shortness of breath.  You throw up (vomit) and your throw up looks like blood or coffee grounds.  Your poop (stool) is bloody or black. This information is not intended to replace advice given to you by your health care provider. Make sure you discuss any questions you have with your health care provider. Document Released: 05/06/2011 Document Revised: 01/30/2016 Document Reviewed: 12/19/2014 [employer] Interactive Patient Education  2018 [employer]  

## 2017-02-09 NOTE — MAU Provider Note (Signed)
History    Patient Emily Callahan is a 21 y.o. G1P0000 At [redacted]w[redacted]d here with complaints of chest pain, abdominal pain and vomiting. Patient denies contractions, decreased fetal movements, bleeding, abnormal discharge. Abdominal pain is on the right and left side of her belly button and radiates to her ribcage.  CSN: [id number]  Arrival date and time: 02/09/17 1548   First Provider Initiated Contact with Patient 02/09/17 1734      No chief complaint on file.  Chest pain started before she vomited; came on suddenly. Last night it was a 10; she tried her inhaler but it didn't help so she went to sleep.  She feels like someone is punching her in her chest.   She has only had a bowl of cereal today. She threw up after she ate her cereal.  Today she ambulated without problems.  Her next prenatal visit is next week.    Abdominal Pain  This is a new problem. The current episode started today. The onset quality is gradual. The problem occurs constantly. The problem has been unchanged. The pain is located in the LLQ and RLQ. The pain is at a severity of 9/10. The quality of the pain is aching. Pain radiation: feels like when she touches it it spreads downwards. Associated symptoms include vomiting. Pertinent negatives include no diarrhea or dysuria. Associated symptoms comments: Threw up 3 times last night. Relieved by: feels better when she curls up. Treatments tried: tried her nausea pills but they didn't work.    OB History    Gravida Para Term Preterm AB Living   1 0 0 0 0 0   SAB TAB Ectopic Multiple Live Births   0 0 0 0        Past Medical History:  Diagnosis Date  . Asthma   . IBS (irritable bowel syndrome)   . Pregnant     Past Surgical History:  Procedure Laterality Date  . MOUTH SURGERY      Family History  Problem Relation Age of Onset  . Hypertension Mother   . Cancer Mother   . Stroke Mother     Social History  Substance Use Topics  . Smoking status: Never Smoker   . Smokeless tobacco: Never Used  . Alcohol use Yes     Comment: occasional    Allergies: No Known Allergies  Prescriptions Prior to Admission  Medication Sig Dispense Refill Last Dose  . acetaminophen (TYLENOL) 325 MG tablet Take 650 mg by mouth every 6 (six) hours as needed for moderate pain or headache.    Past Month at Unknown time  . omeprazole (PRILOSEC) 20 MG capsule Take 1 capsule (20 mg total) by mouth daily. 60 capsule 5 02/08/2017 at Unknown time  . Prenat-Fe Poly-Methfol-FA-DHA (VITAFOL ULTRA) 29-0.6-0.4-200 MG CAPS Take 1 capsule by mouth daily before breakfast. 90 capsule 3 02/08/2017 at Unknown time  . cyclobenzaprine (FLEXERIL) 10 MG tablet Take 1 tablet (10 mg total) by mouth 2 (two) times daily as needed for muscle spasms. (Patient not taking: Reported on 02/09/2017) 20 tablet 0 Not Taking at Unknown time  . doxylamine, Sleep, (UNISOM) 25 MG tablet Take 1 tablet (25 mg total) by mouth at bedtime. (Patient not taking: Reported on 11/25/2016) 30 tablet 5 Not Taking  . loratadine (CLARITIN) 10 MG tablet Take 1 tablet (10 mg total) by mouth daily. (Patient not taking: Reported on 02/03/2017) 30 tablet 11 Not Taking  . metoCLOPramide (REGLAN) 5 MG tablet Take 1 tablet (5 mg total)  by mouth every 6 (six) hours. (Patient not taking: Reported on 11/25/2016) 20 tablet 0 Not Taking  . pyridOXINE (VITAMIN B-6) 100 MG tablet Take 1 tablet (100 mg total) by mouth at bedtime. (Patient not taking: Reported on 11/25/2016) 30 tablet 5 Not Taking    Review of Systems  Respiratory: Negative.   Cardiovascular: Positive for chest pain.  Gastrointestinal: Positive for abdominal pain and vomiting. Negative for diarrhea.  Genitourinary: Negative for dysuria, vaginal bleeding and vaginal discharge.  Musculoskeletal: Negative.    Physical Exam   Blood pressure 108/64, pulse 99, temperature 98.7 F (37.1 C), temperature source Oral, resp. rate 17, height 5\' 4"  (1.626 m), weight 163 lb (73.9 kg), last  menstrual period 07/08/2016, SpO2 99 %.  Physical Exam  Constitutional: She is oriented to person, place, and time. She appears well-developed and well-nourished.  HENT:  Head: Normocephalic.  Neck: Normal range of motion.  Respiratory: Effort normal.  GI: Soft.  Musculoskeletal: Normal range of motion.  Neurological: She is alert and oriented to person, place, and time. She has normal reflexes.  Skin: Skin is warm and dry.  Psychiatric: She has a normal mood and affect.    MAU Course  Procedures  MDM -EKG normal -flexeril for muscle pain -reglan for nausea; Gi cocktail Patient states that her pain is now gone; she feels only a little bit of pain on each side.  -NST 130 with mod variability, neg decel, present acel, no contractions  Assessment and Plan   1. Abdominal pain during pregnancy in third trimester    2. Patient stable for discharge with recommendations to eat a light diet, stretch and continue to take her pepcid. Explained physiologic pains in pregnancy.   3. Reviewed when to return to the MAU; patient plans to keep her appt next week.  4. Urine culture sent as large leuks in her urine.   Charlesetta GaribaldiKathryn Lorraine Bensyn Bornemann CNM 02/09/2017, 5:47 PM

## 2017-02-09 NOTE — MAU Provider Note (Signed)
History     CSN: [id number]  Arrival date and time: 02/09/17 1548   First Provider Initiated Contact with Patient 02/09/17 1734      No chief complaint on file.  HPI   Emily Callahan is a 21 yo 5169w6d G1P0 who presents to the maternity admissions unit with complaints of chest pain and intermittent headaches.  She reports that the chest pain began about 2 weeks ago but has progressively gotten worse. She describes the pain as pressure like and it does not radiate.  Her headaches come every few hours and last about 30 mins, she describes them as band like across her frontal and temporal areas.  She has not taken anything for the pain. Pertinent history includes GERD that she stared taking prilosec for, and asthma.  She admits to having an asthma attack last week.  She admits  nausea/vomiting that has been consistent throughout her pregnancy, shortness of breath, right and left flank pain, and fetal movement.  She denies any anxiety or stress, LOF, vaginal discharge or bleeding.    OB History    Gravida Para Term Preterm AB Living   1 0 0 0 0 0   SAB TAB Ectopic Multiple Live Births   0 0 0 0        Past Medical History:  Diagnosis Date  . Asthma   . IBS (irritable bowel syndrome)   . Pregnant     Past Surgical History:  Procedure Laterality Date  . MOUTH SURGERY      Family History  Problem Relation Age of Onset  . Hypertension Mother   . Cancer Mother   . Stroke Mother     Social History  Substance Use Topics  . Smoking status: Never Smoker  . Smokeless tobacco: Never Used  . Alcohol use Yes     Comment: occasional    Allergies: No Known Allergies  Prescriptions Prior to Admission  Medication Sig Dispense Refill Last Dose  . acetaminophen (TYLENOL) 325 MG tablet Take 650 mg by mouth every 6 (six) hours as needed for moderate pain or headache.    Past Month at Unknown time  . omeprazole (PRILOSEC) 20 MG capsule Take 1 capsule (20 mg total) by mouth daily. 60  capsule 5 02/08/2017 at Unknown time  . Prenat-Fe Poly-Methfol-FA-DHA (VITAFOL ULTRA) 29-0.6-0.4-200 MG CAPS Take 1 capsule by mouth daily before breakfast. 90 capsule 3 02/08/2017 at Unknown time  . cyclobenzaprine (FLEXERIL) 10 MG tablet Take 1 tablet (10 mg total) by mouth 2 (two) times daily as needed for muscle spasms. (Patient not taking: Reported on 02/09/2017) 20 tablet 0 Not Taking at Unknown time  . doxylamine, Sleep, (UNISOM) 25 MG tablet Take 1 tablet (25 mg total) by mouth at bedtime. (Patient not taking: Reported on 11/25/2016) 30 tablet 5 Not Taking  . loratadine (CLARITIN) 10 MG tablet Take 1 tablet (10 mg total) by mouth daily. (Patient not taking: Reported on 02/03/2017) 30 tablet 11 Not Taking  . metoCLOPramide (REGLAN) 5 MG tablet Take 1 tablet (5 mg total) by mouth every 6 (six) hours. (Patient not taking: Reported on 11/25/2016) 20 tablet 0 Not Taking  . pyridOXINE (VITAMIN B-6) 100 MG tablet Take 1 tablet (100 mg total) by mouth at bedtime. (Patient not taking: Reported on 11/25/2016) 30 tablet 5 Not Taking    Review of Systems  Constitutional: Negative for fever.  Respiratory: Positive for shortness of breath.   Cardiovascular: Positive for chest pain.  Gastrointestinal: Negative for abdominal pain.  Genitourinary: Positive for flank pain. Negative for difficulty urinating, dysuria, vaginal bleeding, vaginal discharge and vaginal pain.  Musculoskeletal: Positive for back pain.  Neurological: Positive for dizziness. Negative for light-headedness.   Physical Exam    Results for orders placed or performed during the hospital encounter of 02/09/17 (from the past 24 hour(s))  Urinalysis, Routine w reflex microscopic     Status: Abnormal   Collection Time: 02/09/17  3:50 PM  Result Value Ref Range   Color, Urine AMBER (A) YELLOW   APPearance CLOUDY (A) CLEAR   Specific Gravity, Urine 1.014 1.005 - 1.030   pH 6.0 5.0 - 8.0   Glucose, UA NEGATIVE NEGATIVE mg/dL   Hgb urine  dipstick NEGATIVE NEGATIVE   Bilirubin Urine NEGATIVE NEGATIVE   Ketones, ur NEGATIVE NEGATIVE mg/dL   Protein, ur NEGATIVE NEGATIVE mg/dL   Nitrite NEGATIVE NEGATIVE   Leukocytes, UA LARGE (A) NEGATIVE   RBC / HPF 0-5 0 - 5 RBC/hpf   WBC, UA 6-30 0 - 5 WBC/hpf   Bacteria, UA NONE SEEN NONE SEEN   Squamous Epithelial / LPF TOO NUMEROUS TO COUNT (A) NONE SEEN   Mucous PRESENT    Blood pressure 108/64, pulse 99, temperature 98.7 F (37.1 C), temperature source Oral, resp. rate 17, height 5\' 4"  (1.626 m), weight 73.9 kg (163 lb), last menstrual period 07/08/2016, SpO2 99 %.  Physical Exam  Constitutional: She appears well-developed and well-nourished.  Cardiovascular: Normal rate, regular rhythm and normal heart sounds.   Respiratory: Effort normal and breath sounds normal.  GI: Soft. Bowel sounds are normal. There is tenderness.      MAU Course  Procedures  MDM   Assessment and Plan    Jeanann Lewandowsky 02/09/2017, 5:36 PM

## 2017-02-11 LAB — CULTURE, OB URINE

## 2017-02-17 ENCOUNTER — Encounter: Payer: Self-pay | Admitting: Obstetrics

## 2017-02-17 ENCOUNTER — Ambulatory Visit (INDEPENDENT_AMBULATORY_CARE_PROVIDER_SITE_OTHER): Payer: BLUE CROSS/BLUE SHIELD | Admitting: Obstetrics

## 2017-02-17 VITALS — BP 122/75 | HR 112 | Wt 159.8 lb

## 2017-02-17 DIAGNOSIS — Z3400 Encounter for supervision of normal first pregnancy, unspecified trimester: Secondary | ICD-10-CM

## 2017-02-17 DIAGNOSIS — Z3482 Encounter for supervision of other normal pregnancy, second trimester: Secondary | ICD-10-CM

## 2017-02-17 NOTE — Progress Notes (Signed)
Subjective:  Emily Callahan is a 21 y.o. G1P0000 at 5269w0d being seen today for ongoing prenatal care.  She is currently monitored for the following issues for this low-risk pregnancy and has Asthma; Supervision of normal pregnancy, antepartum; and Nausea and vomiting in pregnancy on her problem list.  Patient reports no complaints.  Contractions: Not present. Vag. Bleeding: None.  Movement: Present. Denies leaking of fluid.   The following portions of the patient's history were reviewed and updated as appropriate: allergies, current medications, past family history, past medical history, past social history, past surgical history and problem list. Problem list updated.  Objective:   Vitals:   02/17/17 1413  BP: 122/75  Pulse: (!) 112  Weight: 159 lb 12.8 oz (72.5 kg)    Fetal Status: Fetal Heart Rate (bpm): 140   Movement: Present     General:  Alert, oriented and cooperative. Patient is in no acute distress.  Skin: Skin is warm and dry. No rash noted.   Cardiovascular: Normal heart rate noted  Respiratory: Normal respiratory effort, no problems with respiration noted  Abdomen: Soft, gravid, appropriate for gestational age. Pain/Pressure: Absent     Pelvic:  Cervical exam deferred        Extremities: Normal range of motion.  Edema: None  Mental Status: Normal mood and affect. Normal behavior. Normal judgment and thought content.   Urinalysis:      Assessment and Plan:  Pregnancy: G1P0000 at 7569w0d  1. Supervision of normal first pregnancy, antepartum   Preterm labor symptoms and general obstetric precautions including but not limited to vaginal bleeding, contractions, leaking of fluid and fetal movement were reviewed in detail with the patient. Please refer to After Visit Summary for other counseling recommendations.  Return in about 2 weeks (around 03/03/2017) for ROB.   Brock BadHarper, Jazira Maloney A, MDPatient ID: Emily HilaShantia Huaracha, female   DOB: 11/11/1995, 21 y.o.   MRN: [id number]

## 2017-02-17 NOTE — Progress Notes (Signed)
Patient is having a lot of nausea. Patient is concerned about her weight loss. Patient is having more frequent headaches.

## 2017-02-26 ENCOUNTER — Inpatient Hospital Stay (HOSPITAL_COMMUNITY)
Admission: AD | Admit: 2017-02-26 | Discharge: 2017-02-26 | Disposition: A | Discharge status: Home / Self Care | Payer: BLUE CROSS/BLUE SHIELD | Source: Ambulatory Visit | Attending: Family Medicine | Admitting: Family Medicine

## 2017-02-26 ENCOUNTER — Encounter (HOSPITAL_COMMUNITY): Payer: Self-pay

## 2017-02-26 DIAGNOSIS — Z823 Family history of stroke: Secondary | ICD-10-CM | POA: Insufficient documentation

## 2017-02-26 DIAGNOSIS — O26893 Other specified pregnancy related conditions, third trimester: Secondary | ICD-10-CM | POA: Diagnosis not present

## 2017-02-26 DIAGNOSIS — O9989 Other specified diseases and conditions complicating pregnancy, childbirth and the puerperium: Secondary | ICD-10-CM | POA: Diagnosis not present

## 2017-02-26 DIAGNOSIS — O219 Vomiting of pregnancy, unspecified: Secondary | ICD-10-CM

## 2017-02-26 DIAGNOSIS — R1030 Lower abdominal pain, unspecified: Secondary | ICD-10-CM | POA: Diagnosis not present

## 2017-02-26 DIAGNOSIS — Z8249 Family history of ischemic heart disease and other diseases of the circulatory system: Secondary | ICD-10-CM | POA: Diagnosis not present

## 2017-02-26 DIAGNOSIS — Z3A33 33 weeks gestation of pregnancy: Secondary | ICD-10-CM | POA: Insufficient documentation

## 2017-02-26 DIAGNOSIS — O212 Late vomiting of pregnancy: Secondary | ICD-10-CM | POA: Diagnosis not present

## 2017-02-26 DIAGNOSIS — R51 Headache: Secondary | ICD-10-CM | POA: Insufficient documentation

## 2017-02-26 DIAGNOSIS — R519 Headache, unspecified: Secondary | ICD-10-CM

## 2017-02-26 LAB — URINALYSIS, ROUTINE W REFLEX MICROSCOPIC
BILIRUBIN URINE: NEGATIVE
Glucose, UA: NEGATIVE mg/dL
Hgb urine dipstick: NEGATIVE
KETONES UR: NEGATIVE mg/dL
Nitrite: NEGATIVE
Protein, ur: NEGATIVE mg/dL
Specific Gravity, Urine: 1.014 (ref 1.005–1.030)
pH: 8 (ref 5.0–8.0)

## 2017-02-26 LAB — CBC WITH DIFFERENTIAL/PLATELET
BASOS PCT: 0 %
Basophils Absolute: 0 10*3/uL (ref 0.0–0.1)
EOS ABS: 0.1 10*3/uL (ref 0.0–0.7)
Eosinophils Relative: 1 %
HCT: 34.4 % — ABNORMAL LOW (ref 36.0–46.0)
HEMOGLOBIN: 10.7 g/dL — AB (ref 12.0–15.0)
Lymphocytes Relative: 20 %
Lymphs Abs: 2.7 10*3/uL (ref 0.7–4.0)
MCH: 26 pg (ref 26.0–34.0)
MCHC: 31.1 g/dL (ref 30.0–36.0)
MCV: 83.5 fL (ref 78.0–100.0)
Monocytes Absolute: 0.4 10*3/uL (ref 0.1–1.0)
Monocytes Relative: 3 %
NEUTROS PCT: 76 %
Neutro Abs: 10.5 10*3/uL — ABNORMAL HIGH (ref 1.7–7.7)
Platelets: 262 10*3/uL (ref 150–400)
RBC: 4.12 MIL/uL (ref 3.87–5.11)
RDW: 14 % (ref 11.5–15.5)
WBC: 13.7 10*3/uL — AB (ref 4.0–10.5)

## 2017-02-26 MED ORDER — LACTATED RINGERS IV BOLUS (SEPSIS)
1000.0000 mL | Freq: Once | INTRAVENOUS | Status: AC
  Administered 2017-02-26: 1000 mL via INTRAVENOUS

## 2017-02-26 MED ORDER — DIPHENHYDRAMINE HCL 50 MG/ML IJ SOLN
25.0000 mg | INTRAMUSCULAR | Status: AC
  Administered 2017-02-26: 25 mg via INTRAVENOUS
  Filled 2017-02-26: qty 1

## 2017-02-26 MED ORDER — METOCLOPRAMIDE HCL 5 MG/ML IJ SOLN
10.0000 mg | INTRAMUSCULAR | Status: AC
  Administered 2017-02-26: 10 mg via INTRAVENOUS
  Filled 2017-02-26: qty 2

## 2017-02-26 MED ORDER — DEXAMETHASONE SODIUM PHOSPHATE 10 MG/ML IJ SOLN
10.0000 mg | INTRAMUSCULAR | Status: AC
  Administered 2017-02-26: 10 mg via INTRAVENOUS
  Filled 2017-02-26: qty 1

## 2017-02-26 NOTE — Discharge Instructions (Signed)
-   Restart Vitamin B6 and Unisom combination - Stay well-hydrated - Continue with Tylenol as needed for pain

## 2017-02-26 NOTE — MAU Provider Note (Signed)
History     CSN: [id number]  Arrival date and time: 02/26/17 1552   First Provider Initiated Contact with Patient 02/26/17 1713      Chief Complaint  Patient presents with  . Emesis During Pregnancy  . Abdominal Pain   HPI  [name] is a 21 yo G1P0 at 33.[redacted] wks gestation presenting to MAU with complaints of dizzy spell on Wednesday after swimming, worsening vomiting x 2 wks, increasing lower abdominal pain x 3 days, and a "migraine" H/A since this AM.  She has taken nothing for nausea or pain today.  She denies VB or LOF.  She reports good FM.  Past Medical History:  Diagnosis Date  . Asthma   . IBS (irritable bowel syndrome)   . Pregnant     Past Surgical History:  Procedure Laterality Date  . MOUTH SURGERY     Wisdom teeth "pulled"     Family History  Problem Relation Age of Onset  . Hypertension Mother   . Cancer Mother   . Stroke Mother     Social History  Substance Use Topics  . Smoking status: Never Smoker  . Smokeless tobacco: Never Used  . Alcohol use Yes     Comment: no usage since + preg test     Allergies: No Known Allergies  Prescriptions Prior to Admission  Medication Sig Dispense Refill Last Dose  . acetaminophen (TYLENOL) 325 MG tablet Take 650 mg by mouth every 6 (six) hours as needed for moderate pain or headache.    Past Week at Unknown time  . loratadine (CLARITIN) 10 MG tablet Take 1 tablet (10 mg total) by mouth daily. 30 tablet 11 Past Week at Unknown time  . omeprazole (PRILOSEC) 20 MG capsule Take 1 capsule (20 mg total) by mouth daily. 60 capsule 5 Past Week at Unknown time  . Prenat-Fe Poly-Methfol-FA-DHA (VITAFOL ULTRA) 29-0.6-0.4-200 MG CAPS Take 1 capsule by mouth daily before breakfast. 90 capsule 3 Past Week at Unknown time  . doxylamine, Sleep, (UNISOM) 25 MG tablet Take 1 tablet (25 mg total) by mouth at bedtime. (Patient not taking: Reported on 02/26/2017) 30 tablet 5 Not Taking at Unknown time  . pyridOXINE (VITAMIN  B-6) 100 MG tablet Take 1 tablet (100 mg total) by mouth at bedtime. (Patient not taking: Reported on 02/26/2017) 30 tablet 5 Not Taking at Unknown time    Review of Systems  Constitutional: Negative.   HENT: Negative.   Eyes: Negative.   Respiratory: Negative.   Cardiovascular: Negative.   Gastrointestinal: Positive for abdominal pain (lower x 3 days), nausea and vomiting (x 2 wks).  Endocrine: Negative.   Genitourinary: Negative.   Musculoskeletal: Negative.   Skin: Negative.   Allergic/Immunologic: Negative.   Neurological: Positive for light-headedness (on Wednesday; resolved now) and headaches ("migraine" that started today).  Hematological: Negative.   Psychiatric/Behavioral: Negative.    Physical Exam   Blood pressure (!) 109/58, pulse 97, temperature 98.6 F (37 C), temperature source Oral, resp. rate 16, last menstrual period 07/08/2016.  Physical Exam  Constitutional: She is oriented to person, place, and time. She appears well-developed and well-nourished.  HENT:  Head: Normocephalic.  Eyes: Pupils are equal, round, and reactive to light.  Neck: Normal range of motion.  Cardiovascular: Normal rate, regular rhythm, normal heart sounds and intact distal pulses.   Respiratory: Effort normal and breath sounds normal.  GI: Soft. Bowel sounds are normal.  Genitourinary:  Genitourinary Comments: Gravid, non-tender, Pelvic deferred  Musculoskeletal: Normal  range of motion.  Neurological: She is alert and oriented to person, place, and time. She has normal reflexes.  Skin: Skin is warm and dry.  Psychiatric: She has a normal mood and affect. Her behavior is normal. Judgment and thought content normal.    MAU Course  Procedures  MDM [facility] Orthostatic VS NST LR 1000 ml bolus Decadron 10 mg slow IVP Benadryl 25 mg slow IVP Reglan 10 mg slow IVP  Assessment and Plan  Nausea and vomiting in pregnancy - Restart Vitamin B6 and Unisom combination - Stay  well-hydrated Acute nonintractable headache, unspecified headache type - Continue with Tylenol prn pain  Discharge home Patient verbalized an understanding of the plan of care and agrees.   Raelyn Mora, MSN, CNM 02/26/2017, 5:19 PM

## 2017-02-26 NOTE — MAU Note (Addendum)
Pt advised by nurse to come to MAU, had a "dizzy spell" the other day, has been vomiting x 2 weeks, has lower abd pain usually only at night.  Denies bleeding or LOF. Pt also states she has HA.

## 2017-02-26 NOTE — MAU Note (Signed)
Pt. Reports increased abd. Pain. Dizzy spell "the other day" post being outside at the pool. "migraine"

## 2017-03-03 ENCOUNTER — Encounter: Payer: BLUE CROSS/BLUE SHIELD | Admitting: Certified Nurse Midwife

## 2017-03-09 ENCOUNTER — Ambulatory Visit (INDEPENDENT_AMBULATORY_CARE_PROVIDER_SITE_OTHER): Payer: BLUE CROSS/BLUE SHIELD | Admitting: Obstetrics

## 2017-03-09 VITALS — BP 109/69 | HR 87 | Wt 162.5 lb

## 2017-03-09 DIAGNOSIS — Z3403 Encounter for supervision of normal first pregnancy, third trimester: Secondary | ICD-10-CM

## 2017-03-09 DIAGNOSIS — Z3400 Encounter for supervision of normal first pregnancy, unspecified trimester: Secondary | ICD-10-CM

## 2017-03-10 ENCOUNTER — Encounter: Payer: Self-pay | Admitting: Obstetrics

## 2017-03-10 NOTE — Progress Notes (Signed)
Subjective:  Emily Callahan is a 21 y.o. G1P0000 at 5920w0d being seen today for ongoing prenatal care.  She is currently monitored for the following issues for this low-risk pregnancy and has Asthma; Supervision of normal pregnancy, antepartum; and Nausea and vomiting in pregnancy on her problem list.  Patient reports backache.  Contractions: Irritability. Vag. Bleeding: None.  Movement: Absent. Denies leaking of fluid.   The following portions of the patient's history were reviewed and updated as appropriate: allergies, current medications, past family history, past medical history, past social history, past surgical history and problem list. Problem list updated.  Objective:   Vitals:   03/09/17 1600  BP: 109/69  Pulse: 87  Weight: 162 lb 8 oz (73.7 kg)    Fetal Status: Fetal Heart Rate (bpm): 140   Movement: Absent     General:  Alert, oriented and cooperative. Patient is in no acute distress.  Skin: Skin is warm and dry. No rash noted.   Cardiovascular: Normal heart rate noted  Respiratory: Normal respiratory effort, no problems with respiration noted  Abdomen: Soft, gravid, appropriate for gestational age. Pain/Pressure: Present     Pelvic:  Cervical exam deferred        Extremities: Normal range of motion.  Edema: Trace  Mental Status: Normal mood and affect. Normal behavior. Normal judgment and thought content.   Urinalysis:      Assessment and Plan:  Pregnancy: G1P0000 at 5820w0d  1. Supervision of normal first pregnancy, antepartum   Preterm labor symptoms and general obstetric precautions including but not limited to vaginal bleeding, contractions, leaking of fluid and fetal movement were reviewed in detail with the patient. Please refer to After Visit Summary for other counseling recommendations.  Return in about 1 week (around 03/16/2017) for ROB.   Brock BadHarper, Shamal Stracener A, MDPatient ID: Emily Callahan, female   DOB: 07/25/1996, 21 y.o.   MRN: [id number]

## 2017-03-16 ENCOUNTER — Ambulatory Visit (INDEPENDENT_AMBULATORY_CARE_PROVIDER_SITE_OTHER): Payer: BLUE CROSS/BLUE SHIELD | Admitting: Obstetrics

## 2017-03-16 ENCOUNTER — Encounter: Payer: Self-pay | Admitting: Obstetrics

## 2017-03-16 VITALS — BP 118/64 | HR 84 | Wt 163.6 lb

## 2017-03-16 DIAGNOSIS — Z3400 Encounter for supervision of normal first pregnancy, unspecified trimester: Secondary | ICD-10-CM

## 2017-03-16 DIAGNOSIS — Z3403 Encounter for supervision of normal first pregnancy, third trimester: Secondary | ICD-10-CM

## 2017-03-16 NOTE — Progress Notes (Signed)
Patient reports good fetal movement with contractions that come and go. 

## 2017-03-16 NOTE — Progress Notes (Signed)
Subjective:  Emily Callahan is a 21 y.o. G1P0000 at 3044w6d being seen today for ongoing prenatal care.  She is currently monitored for the following issues for this low-risk pregnancy and has Asthma; Supervision of normal pregnancy, antepartum; and Nausea and vomiting in pregnancy on her problem list.  Patient reports occasional contractions.  Contractions: Irregular. Vag. Bleeding: None.  Movement: Present. Denies leaking of fluid.   The following portions of the patient's history were reviewed and updated as appropriate: allergies, current medications, past family history, past medical history, past social history, past surgical history and problem list. Problem list updated.  Objective:   Vitals:   03/16/17 0959  BP: 118/64  Pulse: 84  Weight: 163 lb 9.6 oz (74.2 kg)    Fetal Status: Fetal Heart Rate (bpm): 140   Movement: Present     General:  Alert, oriented and cooperative. Patient is in no acute distress.  Skin: Skin is warm and dry. No rash noted.   Cardiovascular: Normal heart rate noted  Respiratory: Normal respiratory effort, no problems with respiration noted  Abdomen: Soft, gravid, appropriate for gestational age. Pain/Pressure: Present     Pelvic:  Cervical exam deferred        Extremities: Normal range of motion.  Edema: Trace  Mental Status: Normal mood and affect. Normal behavior. Normal judgment and thought content.   Urinalysis:      Assessment and Plan:  Pregnancy: G1P0000 at 5944w6d  1. Supervision of normal first pregnancy, antepartum Rx: - Strep Gp B NAA  Preterm labor symptoms and general obstetric precautions including but not limited to vaginal bleeding, contractions, leaking of fluid and fetal movement were reviewed in detail with the patient. Please refer to After Visit Summary for other counseling recommendations.  Return in about 1 week (around 03/23/2017) for ROB.   Brock BadHarper, Charles A, MDPatient ID: Emily HilaShantia Alen, female   DOB: 04/16/1996, 21 y.o.    MRN: [id number]

## 2017-03-18 LAB — OB RESULTS CONSOLE GBS: GBS: NEGATIVE

## 2017-03-18 LAB — STREP GP B NAA: Strep Gp B NAA: NEGATIVE

## 2017-03-20 ENCOUNTER — Inpatient Hospital Stay (HOSPITAL_COMMUNITY)
Admission: AD | Admit: 2017-03-20 | Discharge: 2017-03-20 | Disposition: A | Discharge status: Home / Self Care | Payer: BLUE CROSS/BLUE SHIELD | Source: Ambulatory Visit | Attending: Obstetrics and Gynecology | Admitting: Obstetrics and Gynecology

## 2017-03-20 ENCOUNTER — Encounter (HOSPITAL_COMMUNITY): Payer: Self-pay

## 2017-03-20 DIAGNOSIS — R319 Hematuria, unspecified: Secondary | ICD-10-CM

## 2017-03-20 DIAGNOSIS — O212 Late vomiting of pregnancy: Secondary | ICD-10-CM | POA: Diagnosis not present

## 2017-03-20 DIAGNOSIS — O23593 Infection of other part of genital tract in pregnancy, third trimester: Secondary | ICD-10-CM | POA: Diagnosis not present

## 2017-03-20 DIAGNOSIS — O2343 Unspecified infection of urinary tract in pregnancy, third trimester: Secondary | ICD-10-CM | POA: Insufficient documentation

## 2017-03-20 DIAGNOSIS — N390 Urinary tract infection, site not specified: Secondary | ICD-10-CM

## 2017-03-20 DIAGNOSIS — B9689 Other specified bacterial agents as the cause of diseases classified elsewhere: Secondary | ICD-10-CM | POA: Diagnosis not present

## 2017-03-20 DIAGNOSIS — Z3A36 36 weeks gestation of pregnancy: Secondary | ICD-10-CM | POA: Diagnosis not present

## 2017-03-20 DIAGNOSIS — N760 Acute vaginitis: Secondary | ICD-10-CM

## 2017-03-20 LAB — WET PREP, GENITAL
SPERM: NONE SEEN
Trich, Wet Prep: NONE SEEN
YEAST WET PREP: NONE SEEN

## 2017-03-20 LAB — URINALYSIS, ROUTINE W REFLEX MICROSCOPIC
BILIRUBIN URINE: NEGATIVE
Glucose, UA: NEGATIVE mg/dL
Ketones, ur: NEGATIVE mg/dL
NITRITE: NEGATIVE
PH: 8 (ref 5.0–8.0)
Protein, ur: NEGATIVE mg/dL
SPECIFIC GRAVITY, URINE: 1.01 (ref 1.005–1.030)

## 2017-03-20 MED ORDER — CEPHALEXIN 500 MG PO CAPS: 500.0000 mg | ORAL_CAPSULE | Freq: Four times a day (QID) | ORAL | 0 refills | Status: DC

## 2017-03-20 NOTE — MAU Note (Signed)
Has been having vomiting since last Sunday.  Having cramping in abd and low back.  Also having diarrhea, has dx of IBS, so unsure if related.  No one else at home has been sick.   Threw up really hard last night, has noted blood in with discharge since then.

## 2017-03-20 NOTE — Discharge Instructions (Signed)
Bacterial Vaginosis Bacterial vaginosis is a vaginal infection that occurs when the normal balance of bacteria in the vagina is disrupted. It results from an overgrowth of certain bacteria. This is the most common vaginal infection among women ages 7-44. Because bacterial vaginosis increases your risk for STIs (sexually transmitted infections), getting treated can help reduce your risk for chlamydia, gonorrhea, herpes, and HIV (human immunodeficiency virus). Treatment is also important for preventing complications in pregnant women, because this condition can cause an early (premature) delivery. What are the causes? This condition is caused by an increase in harmful bacteria that are normally present in small amounts in the vagina. However, the reason that the condition develops is not fully understood. What increases the risk? The following factors may make you more likely to develop this condition:  Having a new sexual partner or multiple sexual partners.  Having unprotected sex.  Douching.  Having an intrauterine device (IUD).  Smoking.  Drug and alcohol abuse.  Taking certain antibiotic medicines.  Being pregnant.  You cannot get bacterial vaginosis from toilet seats, bedding, swimming pools, or contact with objects around you. What are the signs or symptoms? Symptoms of this condition include:  Grey or white vaginal discharge. The discharge can also be watery or foamy.  A fish-like odor with discharge, especially after sexual intercourse or during menstruation.  Itching in and around the vagina.  Burning or pain with urination.  Some women with bacterial vaginosis have no signs or symptoms. How is this diagnosed? This condition is diagnosed based on:  Your medical history.  A physical exam of the vagina.  Testing a sample of vaginal fluid under a microscope to look for a large amount of bad bacteria or abnormal cells. Your health care provider may use a cotton swab  or a small wooden spatula to collect the sample.  How is this treated? This condition is treated with antibiotics. These may be given as a pill, a vaginal cream, or a medicine that is put into the vagina (suppository). If the condition comes back after treatment, a second round of antibiotics may be needed. Follow these instructions at home: Medicines  Take over-the-counter and prescription medicines only as told by your health care provider.  Take or use your antibiotic as told by your health care provider. Do not stop taking or using the antibiotic even if you start to feel better. General instructions  If you have a female sexual partner, tell her that you have a vaginal infection. She should see her health care provider and be treated if she has symptoms. If you have a female sexual partner, he does not need treatment.  During treatment: ? Avoid sexual activity until you finish treatment. ? Do not douche. ? Avoid alcohol as directed by your health care provider. ? Avoid breastfeeding as directed by your health care provider.  Drink enough water and fluids to keep your urine clear or pale yellow.  Keep the area around your vagina and rectum clean. ? Wash the area daily with warm water. ? Wipe yourself from front to back after using the toilet.  Keep all follow-up visits as told by your health care provider. This is important. How is this prevented?  Do not douche.  Wash the outside of your vagina with warm water only.  Use protection when having sex. This includes latex condoms and dental dams.  Limit how many sexual partners you have. To help prevent bacterial vaginosis, it is best to have sex with just  one partner (monogamous). °· Make sure you and your sexual partner are tested for STIs. °· Wear cotton or cotton-lined underwear. °· Avoid wearing tight pants and pantyhose, especially during summer. °· Limit the amount of alcohol that you drink. °· Do not use any products that  contain nicotine or tobacco, such as cigarettes and e-cigarettes. If you need help quitting, ask your health care provider. °· Do not use illegal drugs. °Where to find more information: °· Centers for Disease Control and Prevention: www.cdc.gov/std °· American Sexual Health Association (ASHA): www.ashastd.org °· U.S. Department of Health and Human Services, Office on Women's Health: www.womenshealth.gov/ or https://www.womenshealth.gov/a-z-topics/bacterial-vaginosis °Contact a health care provider if: °· Your symptoms do not improve, even after treatment. °· You have more discharge or pain when urinating. °· You have a fever. °· You have pain in your abdomen. °· You have pain during sex. °· You have vaginal bleeding between periods. °Summary °· Bacterial vaginosis is a vaginal infection that occurs when the normal balance of bacteria in the vagina is disrupted. °· Because bacterial vaginosis increases your risk for STIs (sexually transmitted infections), getting treated can help reduce your risk for chlamydia, gonorrhea, herpes, and HIV (human immunodeficiency virus). Treatment is also important for preventing complications in pregnant women, because the condition can cause an early (premature) delivery. °· This condition is treated with antibiotic medicines. These may be given as a pill, a vaginal cream, or a medicine that is put into the vagina (suppository). °This information is not intended to replace advice given to you by your health care provider. Make sure you discuss any questions you have with your health care provider. °Document Released: 08/24/2005 Document Revised: 05/09/2016 Document Reviewed: 05/09/2016 °Elsevier Interactive Patient Education © 2017 Elsevier Inc. °Asymptomatic Bacteriuria °Asymptomatic bacteriuria is the presence of a large number of bacteria in the urine without the usual symptoms of burning or frequent urination. °What are the causes? °This condition is caused by an increase in  bacteria in the urine. This increase can be caused by: °· Bacteria entering the urinary tract, such as during sex. °· A blockage in the urinary tract, such as from kidney stones or a tumor. °· Bladder problems that prevent the bladder from emptying. ° °What increases the risk? °You are more likely to develop this condition if: °· You have diabetes mellitus. °· You are an elderly adult, especially if you are also in a long-term care facility. °· You are pregnant and in the first trimester. °· You have kidney stones. °· You are female. °· You have had a kidney transplant. °· You have a leaky kidney tube valve (reflux). °· You had a urinary catheter for a long period of time. ° °What are the signs or symptoms? °There are no symptoms of this condition. °How is this diagnosed? °This condition is diagnosed with a urine test. Because this condition does not cause symptoms, it is usually diagnosed when a urine sample is taken to treat or diagnose another condition, such as pregnancy or kidney problems. Most women who are in their first trimester of pregnancy are screened for asymptomatic bacteriuria. °How is this treated? °Usually, treatment is not needed for this condition. Treating the condition can lead to other problems, such as a yeast infection or the growth of bacteria that do not respond to treatment (antibiotic-resistant bacteria). Some people, such as pregnant women and people with kidney transplants, do need treatment with antibiotic medicines to prevent kidney infection (pyelonephritis). In pregnant women, kidney infection can lead to   to premature labor, fetal growth restriction, or newborn death. Follow these instructions at home: Medicines  Take over-the-counter and prescription medicines only as told by your health care provider.  If you were prescribed an antibiotic medicine, take it as told by your health care provider. Do not stop taking the antibiotic even if you start to feel better. General  instructions  Monitor your condition for any changes.  Drink enough fluid to keep your urine clear or pale yellow.  Go to the bathroom more often to keep your bladder empty.  If you are female, keep the area around your vagina and rectum clean. Wipe yourself from front to back after urinating.  Keep all follow-up visits as told by your health care provider. This is important. Contact a health care provider if:  You notice any new symptoms, such as back pain or burning while urinating. Get help right away if:  You develop signs of an infection such as: ? A burning sensation when you urinate. ? Have pain when you urinate. ? Develop an intense need to urinate. ? Urinating more frequently. ? Back pain or pelvic pain. ? Fever or chills.  You have blood in your urine.  Your urine becomes discolored or cloudy.  Your urine smells bad.  You have severe pain that cannot be controlled with medicine. Summary  Asymptomatic bacteriuria is the presence of a large number of bacteria in the urine without the usual symptoms of burning or frequent urination.  Usually, treatment is not needed for this condition. Treating the condition can lead to other problems, such as too much yeast and the growth of antibiotic-resistant bacteria.  Some people, such as pregnant women and people with kidney transplants, do need treatment with antibiotic medicines to prevent kidney infection (pyelonephritis).  If you were prescribed an antibiotic medicine, take it as told by your health care provider. Do not stop taking the antibiotic even if you start to feel better. This information is not intended to replace advice given to you by your health care provider. Make sure you discuss any questions you have with your health care provider. Document Released: 08/24/2005 Document Revised: 08/18/2016 Document Reviewed: 08/18/2016 Elsevier Interactive Patient Education  2017 [employer]

## 2017-03-20 NOTE — MAU Provider Note (Signed)
History     CSN: [id number]  Arrival date and time: 03/20/17 1000   First Provider Initiated Contact with Patient 03/20/17 1027      Chief Complaint  Patient presents with  . Vaginal Bleeding  . Abdominal Pain  . Nausea  . Diarrhea   HPI Emily Callahan is a 21 y.o. G1P0000 at 2585w3d who presents with nausea and vomiting since Sunday. She states she has thrown up 2x daily, denies any diarrhea. She states she has had vomited at least once a day the entire pregnancy but feels like this week is worse. Denies anyone around her with similar symptoms. She also reports some pink spotting when she wipes. She reports irregular abdominal pain and back pain. She denies leaking of fluid or abnormal vaginal discharge. Reports good fetal movement.   OB History    Gravida Para Term Preterm AB Living   1 0 0 0 0 0   SAB TAB Ectopic Multiple Live Births   0 0 0 0       Past Medical History:  Diagnosis Date  . Asthma   . IBS (irritable bowel syndrome)   . Pregnant    Past Surgical History:  Procedure Laterality Date  . MOUTH SURGERY     Wisdom teeth "pulled"    Family History  Problem Relation Age of Onset  . Hypertension Mother   . Cancer Mother   . Stroke Mother    Social History  Substance Use Topics  . Smoking status: Never Smoker  . Smokeless tobacco: Never Used  . Alcohol use Yes     Comment: no usage since + preg test     Allergies: No Known Allergies  Prescriptions Prior to Admission  Medication Sig Dispense Refill Last Dose  . acetaminophen (TYLENOL) 500 MG tablet Take 1,000 mg by mouth every 6 (six) hours as needed for mild pain, moderate pain, fever or headache.   03/19/2017 at 2000  . loratadine (CLARITIN) 10 MG tablet Take 1 tablet (10 mg total) by mouth daily. 30 tablet 11 03/19/2017 at Unknown time  . omeprazole (PRILOSEC) 20 MG capsule Take 1 capsule (20 mg total) by mouth daily. (Patient taking differently: Take 20 mg by mouth at bedtime. ) 60 capsule 5 03/19/2017  at Unknown time  . Prenat-Fe Poly-Methfol-FA-DHA (VITAFOL ULTRA) 29-0.6-0.4-200 MG CAPS Take 1 capsule by mouth daily before breakfast. 90 capsule 3 03/19/2017 at Unknown time    Review of Systems  Constitutional: Negative.  Negative for chills and fever.  HENT: Negative.   Respiratory: Negative.  Negative for shortness of breath.   Cardiovascular: Negative.  Negative for chest pain.  Gastrointestinal: Positive for abdominal pain, nausea and vomiting. Negative for constipation and diarrhea.  Genitourinary: Positive for vaginal bleeding. Negative for dysuria and vaginal discharge.  Musculoskeletal: Positive for back pain.  Neurological: Negative.  Negative for dizziness and headaches.  Psychiatric/Behavioral: Negative.    Physical Exam   Blood pressure (!) 108/59, pulse 84, temperature 98 F (36.7 C), temperature source Oral, resp. rate 16, height 5\' 4"  (1.626 m), weight 165 lb (74.8 kg), last menstrual period 07/08/2016.  Physical Exam  Nursing note and vitals reviewed. Constitutional: She appears well-developed and well-nourished.  HENT:  Head: Normocephalic and atraumatic.  Eyes: Conjunctivae are normal. No scleral icterus.  Cardiovascular: Normal rate and normal heart sounds.   Respiratory: Effort normal. No respiratory distress.  GI: Soft. She exhibits no distension. There is no tenderness.  Genitourinary: Cervix exhibits discharge and friability. Cervix exhibits  no motion tenderness. No tenderness or bleeding in the vagina. Vaginal discharge found.  Genitourinary Comments: Thick yellow discharge coming from os and in vault; cervix with erythema  Neurological: She is alert.  Skin: Skin is warm and dry.  Psychiatric: She has a normal mood and affect. Her behavior is normal. Judgment and thought content normal.   Dilation: Closed Effacement (%): Thick Exam by:: C Niell CNM Student  Fetal Tracing:  Baseline: 120 Variability: moderate Accelerations: 15x15 Decelerations:  none  Toco: q 7-8 min  MAU Course  Procedures Results for orders placed or performed during the hospital encounter of 03/20/17 (from the past 24 hour(s))  Urinalysis, Routine w reflex microscopic     Status: Abnormal   Collection Time: 03/20/17 10:14 AM  Result Value Ref Range   Color, Urine YELLOW YELLOW   APPearance HAZY (A) CLEAR   Specific Gravity, Urine 1.010 1.005 - 1.030   pH 8.0 5.0 - 8.0   Glucose, UA NEGATIVE NEGATIVE mg/dL   Hgb urine dipstick SMALL (A) NEGATIVE   Bilirubin Urine NEGATIVE NEGATIVE   Ketones, ur NEGATIVE NEGATIVE mg/dL   Protein, ur NEGATIVE NEGATIVE mg/dL   Nitrite NEGATIVE NEGATIVE   Leukocytes, UA LARGE (A) NEGATIVE   RBC / HPF 0-5 0 - 5 RBC/hpf   WBC, UA TOO NUMEROUS TO COUNT 0 - 5 WBC/hpf   Bacteria, UA RARE (A) NONE SEEN   Squamous Epithelial / LPF TOO NUMEROUS TO COUNT (A) NONE SEEN   Mucous PRESENT   Wet prep, genital     Status: Abnormal   Collection Time: 03/20/17 10:32 AM  Result Value Ref Range   Yeast Wet Prep HPF POC NONE SEEN NONE SEEN   Trich, Wet Prep NONE SEEN NONE SEEN   Clue Cells Wet Prep HPF POC PRESENT (A) NONE SEEN   WBC, Wet Prep HPF POC MODERATE (A) NONE SEEN   Sperm NONE SEEN    MDM UA Urine culture Wet prep and gc/chlamydia Assessment and Plan   1. Urinary tract infection with hematuria, site unspecified   2. Bacterial vaginosis    -Discharge patient home in stable condition -Prescriptions for keflex and metronidazole sent to patient's pharmacy -Labor precautions reviewed -Follow up with [facility] as scheduled for prenatal care -Encouraged to return here or to other Urgent Care/ED if she develops worsening of symptoms, increase in pain, fever, or other concerning symptoms.   Cleone Slim SNM 03/20/2017, 11:09 AM

## 2017-03-20 NOTE — ED Notes (Signed)
Urine in lab 

## 2017-03-21 LAB — CULTURE, OB URINE

## 2017-03-22 LAB — GC/CHLAMYDIA PROBE AMP (~~LOC~~) NOT AT ARMC
Chlamydia: NEGATIVE
Neisseria Gonorrhea: NEGATIVE

## 2017-03-23 ENCOUNTER — Telehealth: Payer: Self-pay | Admitting: *Deleted

## 2017-03-23 ENCOUNTER — Ambulatory Visit (INDEPENDENT_AMBULATORY_CARE_PROVIDER_SITE_OTHER): Payer: BLUE CROSS/BLUE SHIELD | Admitting: Obstetrics

## 2017-03-23 ENCOUNTER — Encounter: Payer: Self-pay | Admitting: Obstetrics

## 2017-03-23 VITALS — BP 103/73 | HR 89 | Wt 163.4 lb

## 2017-03-23 DIAGNOSIS — N760 Acute vaginitis: Secondary | ICD-10-CM

## 2017-03-23 DIAGNOSIS — B9689 Other specified bacterial agents as the cause of diseases classified elsewhere: Secondary | ICD-10-CM

## 2017-03-23 DIAGNOSIS — Z3403 Encounter for supervision of normal first pregnancy, third trimester: Secondary | ICD-10-CM

## 2017-03-23 DIAGNOSIS — Z3400 Encounter for supervision of normal first pregnancy, unspecified trimester: Secondary | ICD-10-CM

## 2017-03-23 MED ORDER — METRONIDAZOLE 500 MG PO TABS: 500.0000 mg | ORAL_TABLET | Freq: Two times a day (BID) | ORAL | 0 refills | Status: DC

## 2017-03-23 NOTE — Progress Notes (Signed)
Subjective:  Emily Callahan is a 21 y.o. G1P0000 at 6870w6d being seen today for ongoing prenatal care.  She is currently monitored for the following issues for this low-risk pregnancy and has Asthma; Supervision of normal pregnancy, antepartum; and Nausea and vomiting in pregnancy on her problem list.  Patient reports no complaints.  Contractions: Not present. Vag. Bleeding: None.  Movement: Present. Denies leaking of fluid.   The following portions of the patient's history were reviewed and updated as appropriate: allergies, current medications, past family history, past medical history, past social history, past surgical history and problem list. Problem list updated.  Objective:   Vitals:   03/23/17 1050  BP: 103/73  Pulse: 89  Weight: 163 lb 6.4 oz (74.1 kg)    Fetal Status: Fetal Heart Rate (bpm): 140   Movement: Present     General:  Alert, oriented and cooperative. Patient is in no acute distress.  Skin: Skin is warm and dry. No rash noted.   Cardiovascular: Normal heart rate noted  Respiratory: Normal respiratory effort, no problems with respiration noted  Abdomen: Soft, gravid, appropriate for gestational age. Pain/Pressure: Absent     Pelvic:  Cervical exam deferred        Extremities: Normal range of motion.  Edema: None  Mental Status: Normal mood and affect. Normal behavior. Normal judgment and thought content.   Urinalysis:      Assessment and Plan:  Pregnancy: G1P0000 at 4870w6d  1. Supervision of normal first pregnancy, antepartum   Preterm labor symptoms and general obstetric precautions including but not limited to vaginal bleeding, contractions, leaking of fluid and fetal movement were reviewed in detail with the patient. Please refer to After Visit Summary for other counseling recommendations.  Return in about 1 week (around 03/30/2017) for ROB.   Brock BadHarper, Maxden Naji A, MDPatient ID: Emily Callahan, female   DOB: 10/05/1995, 21 y.o.   MRN: [id number]

## 2017-03-23 NOTE — Telephone Encounter (Signed)
Dr Clearance CootsHarper is going to call in her medication.

## 2017-03-23 NOTE — Progress Notes (Signed)
Patient reports good fetal movement, denies pain, denies bleeding.

## 2017-03-23 NOTE — Addendum Note (Signed)
Addended by: Coral CeoHARPER, CHARLES A on: 03/23/2017 12:54 PM   Modules accepted: Orders

## 2017-04-01 ENCOUNTER — Ambulatory Visit (INDEPENDENT_AMBULATORY_CARE_PROVIDER_SITE_OTHER): Payer: BLUE CROSS/BLUE SHIELD | Admitting: Obstetrics

## 2017-04-01 ENCOUNTER — Encounter: Payer: Self-pay | Admitting: Obstetrics

## 2017-04-01 VITALS — BP 108/68 | HR 85 | Wt 163.0 lb

## 2017-04-01 DIAGNOSIS — Z3400 Encounter for supervision of normal first pregnancy, unspecified trimester: Secondary | ICD-10-CM

## 2017-04-01 DIAGNOSIS — Z3403 Encounter for supervision of normal first pregnancy, third trimester: Secondary | ICD-10-CM

## 2017-04-01 NOTE — Progress Notes (Signed)
Subjective:  Emily Callahan is a 21 y.o. G1P0000 at 6980w1d being seen today for ongoing prenatal care.  She is currently monitored for the following issues for this low-risk pregnancy and has Asthma; Supervision of normal pregnancy, antepartum; and Nausea and vomiting in pregnancy on her problem list.  Patient reports no complaints.  Contractions: Irregular. Vag. Bleeding: None.  Movement: Present. Denies leaking of fluid.   The following portions of the patient's history were reviewed and updated as appropriate: allergies, current medications, past family history, past medical history, past social history, past surgical history and problem list. Problem list updated.  Objective:   Vitals:   04/01/17 1359  BP: 108/68  Pulse: 85  Weight: 163 lb (73.9 kg)    Fetal Status: Fetal Heart Rate (bpm): 140   Movement: Present     General:  Alert, oriented and cooperative. Patient is in no acute distress.  Skin: Skin is warm and dry. No rash noted.   Cardiovascular: Normal heart rate noted  Respiratory: Normal respiratory effort, no problems with respiration noted  Abdomen: Soft, gravid, appropriate for gestational age. Pain/Pressure: Present     Pelvic:  Cervical exam deferred        Extremities: Normal range of motion.     Mental Status: Normal mood and affect. Normal behavior. Normal judgment and thought content.   Urinalysis:      Assessment and Plan:  Pregnancy: G1P0000 at 7980w1d  1. Supervision of normal first pregnancy, antepartum   Preterm labor symptoms and general obstetric precautions including but not limited to vaginal bleeding, contractions, leaking of fluid and fetal movement were reviewed in detail with the patient. Please refer to After Visit Summary for other counseling recommendations.  Return in about 1 week (around 04/08/2017) for ROB.   Brock BadHarper, Charles A, MD

## 2017-04-05 ENCOUNTER — Telehealth: Payer: Self-pay

## 2017-04-05 DIAGNOSIS — N898 Other specified noninflammatory disorders of vagina: Secondary | ICD-10-CM

## 2017-04-05 MED ORDER — FLUCONAZOLE 150 MG PO TABS: 150.0000 mg | ORAL_TABLET | Freq: Once | ORAL | 3 refills | Status: AC

## 2017-04-05 NOTE — Telephone Encounter (Signed)
Pt called complaining of having vaginal itching, after taking antibiotics. Per protocol rx for diflucan sent to the pharmacy

## 2017-04-06 ENCOUNTER — Inpatient Hospital Stay (HOSPITAL_COMMUNITY): Payer: BLUE CROSS/BLUE SHIELD | Admitting: Anesthesiology

## 2017-04-06 ENCOUNTER — Inpatient Hospital Stay (HOSPITAL_COMMUNITY)
Admission: AD | Admit: 2017-04-06 | Discharge: 2017-04-08 | DRG: 775 | Disposition: A | Discharge status: Home / Self Care | Payer: BLUE CROSS/BLUE SHIELD | Source: Ambulatory Visit | Attending: Family Medicine | Admitting: Family Medicine

## 2017-04-06 ENCOUNTER — Encounter (HOSPITAL_COMMUNITY): Payer: Self-pay | Admitting: *Deleted

## 2017-04-06 DIAGNOSIS — Z3493 Encounter for supervision of normal pregnancy, unspecified, third trimester: Secondary | ICD-10-CM | POA: Diagnosis not present

## 2017-04-06 DIAGNOSIS — Z3A38 38 weeks gestation of pregnancy: Secondary | ICD-10-CM | POA: Diagnosis not present

## 2017-04-06 LAB — CBC
HEMATOCRIT: 35.1 % — AB (ref 36.0–46.0)
Hemoglobin: 11.2 g/dL — ABNORMAL LOW (ref 12.0–15.0)
MCH: 25.8 pg — AB (ref 26.0–34.0)
MCHC: 31.9 g/dL (ref 30.0–36.0)
MCV: 80.9 fL (ref 78.0–100.0)
PLATELETS: 269 10*3/uL (ref 150–400)
RBC: 4.34 MIL/uL (ref 3.87–5.11)
RDW: 15.1 % (ref 11.5–15.5)
WBC: 12.8 10*3/uL — AB (ref 4.0–10.5)

## 2017-04-06 LAB — TYPE AND SCREEN
ABO/RH(D): A POS
ANTIBODY SCREEN: NEGATIVE

## 2017-04-06 LAB — ABO/RH: ABO/RH(D): A POS

## 2017-04-06 LAB — RPR: RPR Ser Ql: NONREACTIVE

## 2017-04-06 MED ORDER — OXYCODONE-ACETAMINOPHEN 5-325 MG PO TABS
2.0000 | ORAL_TABLET | ORAL | Status: DC | PRN
Start: 2017-04-06 — End: 2017-04-07

## 2017-04-06 MED ORDER — IBUPROFEN 600 MG PO TABS
600.0000 mg | ORAL_TABLET | Freq: Four times a day (QID) | ORAL | Status: DC
  Administered 2017-04-07 – 2017-04-08 (×6): 600 mg via ORAL
  Filled 2017-04-06 (×7): qty 1

## 2017-04-06 MED ORDER — OXYTOCIN 40 UNITS IN LACTATED RINGERS INFUSION - SIMPLE MED
2.5000 [IU]/h | INTRAVENOUS | Status: DC
  Administered 2017-04-06: 2.5 [IU]/h via INTRAVENOUS
  Filled 2017-04-06: qty 1000

## 2017-04-06 MED ORDER — DIPHENHYDRAMINE HCL 50 MG/ML IJ SOLN: 12.5000 mg | INTRAMUSCULAR | Status: DC | PRN

## 2017-04-06 MED ORDER — PHENYLEPHRINE 40 MCG/ML (10ML) SYRINGE FOR IV PUSH (FOR BLOOD PRESSURE SUPPORT)
80.0000 ug | PREFILLED_SYRINGE | INTRAVENOUS | Status: DC | PRN
  Filled 2017-04-06: qty 5

## 2017-04-06 MED ORDER — LACTATED RINGERS IV SOLN: 500.0000 mL | INTRAVENOUS | Status: DC | PRN

## 2017-04-06 MED ORDER — ACETAMINOPHEN 325 MG PO TABS: 650.0000 mg | ORAL_TABLET | ORAL | Status: DC | PRN

## 2017-04-06 MED ORDER — FENTANYL 2.5 MCG/ML BUPIVACAINE 1/10 % EPIDURAL INFUSION (WH - ANES)
14.0000 mL/h | INTRAMUSCULAR | Status: DC | PRN
  Administered 2017-04-06: 14 mL/h via EPIDURAL
  Filled 2017-04-06: qty 100

## 2017-04-06 MED ORDER — EPHEDRINE 5 MG/ML INJ
10.0000 mg | INTRAVENOUS | Status: DC | PRN
  Filled 2017-04-06: qty 2

## 2017-04-06 MED ORDER — ONDANSETRON HCL 4 MG/2ML IJ SOLN: 4.0000 mg | Freq: Four times a day (QID) | INTRAMUSCULAR | Status: DC | PRN

## 2017-04-06 MED ORDER — PHENYLEPHRINE 40 MCG/ML (10ML) SYRINGE FOR IV PUSH (FOR BLOOD PRESSURE SUPPORT)
80.0000 ug | PREFILLED_SYRINGE | INTRAVENOUS | Status: DC | PRN
  Filled 2017-04-06: qty 5
  Filled 2017-04-06: qty 10

## 2017-04-06 MED ORDER — LIDOCAINE HCL (PF) 1 % IJ SOLN
INTRAMUSCULAR | Status: DC | PRN
Start: 2017-04-06 — End: 2017-04-06
  Administered 2017-04-06: 6 mL via EPIDURAL
  Administered 2017-04-06: 7 mL via EPIDURAL

## 2017-04-06 MED ORDER — FENTANYL CITRATE (PF) 100 MCG/2ML IJ SOLN
100.0000 ug | INTRAMUSCULAR | Status: DC | PRN
  Administered 2017-04-06 (×5): 100 ug via INTRAVENOUS
  Filled 2017-04-06 (×5): qty 2

## 2017-04-06 MED ORDER — FLEET ENEMA 7-19 GM/118ML RE ENEM: 1.0000 | ENEMA | RECTAL | Status: DC | PRN

## 2017-04-06 MED ORDER — LIDOCAINE HCL (PF) 1 % IJ SOLN
30.0000 mL | INTRAMUSCULAR | Status: DC | PRN
  Filled 2017-04-06: qty 30

## 2017-04-06 MED ORDER — LACTATED RINGERS IV SOLN: 500.0000 mL | Freq: Once | INTRAVENOUS | Status: DC

## 2017-04-06 MED ORDER — OXYTOCIN BOLUS FROM INFUSION
500.0000 mL | Freq: Once | INTRAVENOUS | Status: AC
  Administered 2017-04-06: 500 mL via INTRAVENOUS

## 2017-04-06 MED ORDER — SOD CITRATE-CITRIC ACID 500-334 MG/5ML PO SOLN: 30.0000 mL | ORAL | Status: DC | PRN

## 2017-04-06 MED ORDER — LACTATED RINGERS IV SOLN
INTRAVENOUS | Status: DC
  Administered 2017-04-06: 08:00:00 via INTRAVENOUS

## 2017-04-06 MED ORDER — OXYCODONE-ACETAMINOPHEN 5-325 MG PO TABS: 1.0000 | ORAL_TABLET | ORAL | Status: DC | PRN

## 2017-04-06 NOTE — Anesthesia Preprocedure Evaluation (Signed)
Anesthesia Evaluation  Patient identified by MRN, date of birth, ID band Patient awake    Reviewed: Allergy & Precautions, H&P , NPO status , Patient's Chart, lab work & pertinent test results  Airway Mallampati: I  TM Distance: >3 FB Neck ROM: full    Dental no notable dental hx. (+) Teeth Intact   Pulmonary    Pulmonary exam normal breath sounds clear to auscultation       Cardiovascular negative cardio ROS Normal cardiovascular exam Rhythm:regular Rate:Normal     Neuro/Psych negative neurological ROS  negative psych ROS   GI/Hepatic negative GI ROS, Neg liver ROS,   Endo/Other  negative endocrine ROS  Renal/GU negative Renal ROS     Musculoskeletal negative musculoskeletal ROS (+)   Abdominal Normal abdominal exam  (+)   Peds  Hematology negative hematology ROS (+)   Anesthesia Other Findings   Reproductive/Obstetrics (+) Pregnancy                             Anesthesia Physical Anesthesia Plan  ASA: II  Anesthesia Plan: Epidural   Post-op Pain Management:    Induction:   PONV Risk Score and Plan:   Airway Management Planned:   Additional Equipment:   Intra-op Plan:   Post-operative Plan:   Informed Consent: I have reviewed the patients History and Physical, chart, labs and discussed the procedure including the risks, benefits and alternatives for the proposed anesthesia with the patient or authorized representative who has indicated his/her understanding and acceptance.     Plan Discussed with:   Anesthesia Plan Comments:         Anesthesia Quick Evaluation

## 2017-04-06 NOTE — Anesthesia Procedure Notes (Signed)
Epidural Patient location during procedure: OB Start time: 04/06/2017 7:26 PM End time: 04/06/2017 7:29 PM  Staffing Anesthesiologist: Leilani AbleHATCHETT, Yazmina Pareja  Preanesthetic Checklist Completed: patient identified, surgical consent, pre-op evaluation, timeout performed, IV checked, risks and benefits discussed and monitors and equipment checked  Epidural Patient position: sitting Prep: site prepped and draped and DuraPrep Patient monitoring: continuous pulse ox and blood pressure Approach: midline Location: L3-L4 Injection technique: LOR air  Needle:  Needle type: Tuohy  Needle gauge: 17 G Needle length: 9 cm and 9 Needle insertion depth: 6 cm Catheter type: closed end flexible Catheter size: 19 Gauge Catheter at skin depth: 11 cm Test dose: negative and Other  Assessment Sensory level: T9 Events: blood not aspirated, injection not painful, no injection resistance, negative IV test and no paresthesia  Additional Notes Reason for block:procedure for pain

## 2017-04-06 NOTE — Progress Notes (Addendum)
Patient ID: Emily Callahan, female   DOB: 10/29/1995, 21 y.o.   MRN: [id number] Labor Progress Note Emily Callahan is a 21 y.o. G1P0000 at 3040w6d presented for SOL. She continue to have pressure pain, managed by fentanyl.   S:   O:  BP 111/70   Pulse 71   Temp 98.8 F (37.1 C) (Oral)   Resp 20   Ht 5\' 4"  (1.626 m)   Wt 73.9 kg (163 lb)   LMP 07/08/2016 (Exact Date)   BMI 27.98 kg/m  EFM: 110/mod varf/accel present, early accels  CVE: Dilation: 6 Effacement (%): 100 Cervical Position: Middle Station: -1 Presentation: Vertex Exam by:: Ivonne AndrewV. Smith CNM   A&P: 21 y.o. G1P0000 6840w6d SOL  #Labor: No recent change; AROM at 1430; no to minimal changes from 10Am to 1430 Cervical exam.#Pain: fentanyl Prn 1 hour #FWB: Cat 1  #GBS negative   Ignacia MarvelKendrick C Neiva Maenza, MD 3:35 PM

## 2017-04-06 NOTE — Anesthesia Pain Management Evaluation Note (Signed)
  CRNA Pain Management Visit Note  Patient: Emily Callahan, 21 y.o., female  "Hello I am a member of the anesthesia team at [facility]. We have an anesthesia team available at all times to provide care throughout the hospital, including epidural management and anesthesia for C-section. I don't know your plan for the delivery whether it a natural birth, water birth, IV sedation, nitrous supplementation, doula or epidural, but we want to meet your pain goals."   1.Was your pain managed to your expectations on prior hospitalizations?   No prior hospitalizations  2.What is your expectation for pain management during this hospitalization?     IV pain meds  3.How can we help you reach that goal?   Record the patient's initial score and the patient's pain goal.   Pain: 8  Pain Goal: 8 [facility] wants you to be able to say your pain was always managed very well.  Emily Callahan,Emily Callahan 04/06/2017

## 2017-04-06 NOTE — MAU Note (Signed)
Contractions started at 0430.  Coming every 3-35min.  No bleeding or leaking.  Denies any problems with preg.

## 2017-04-06 NOTE — H&P (Signed)
OBSTETRIC ADMISSION HISTORY AND PHYSICAL  Emily Callahan is a 21 y.o. female G1P0000 with IUP at 6770w6d by US at 18wk  presenting for SOL. She reports +FMs, No LOF, no VB, no blurry vision, headaches or peripheral edema, and RUQ pain.  She plans on breastfeeding. feeding. She wants natural family planning, not interested in birth control, but does not desire near future pregnancy. . [facility]   Dating: By US/LMP at  3578w5d. --->  Estimated Date of Delivery: 04/14/17  Sono:    @[redacted]w[redacted]d , CWD, normal anatomy, breech presentation, Placenta - anterior,  539g, 51% EFW   Prenatal History/Complications:  Past Medical History: Past Medical History:  Diagnosis Date  . Asthma   . IBS (irritable bowel syndrome)   . Pregnant     Past Surgical History: Past Surgical History:  Procedure Laterality Date  . MOUTH SURGERY     Wisdom teeth "pulled"     Obstetrical History: OB History    Gravida Para Term Preterm AB Living   1 0 0 0 0 0   SAB TAB Ectopic Multiple Live Births   0 0 0 0        Social History: Social History   Social History  . Marital status: Single    Spouse name: N/A  . Number of children: N/A  . Years of education: N/A   Social History Main Topics  . Smoking status: Never Smoker  . Smokeless tobacco: Never Used  . Alcohol use Yes     Comment: no usage since + preg test   . Drug use: Yes    Types: Marijuana     Comment: No usage since + preg. test  . Sexual activity: Not Currently    Birth control/ protection: None, Other-see comments   Other Topics Concern  . None   Social History Narrative  . None    Family History: Family History  Problem Relation Age of Onset  . Hypertension Mother   . Cancer Mother   . Stroke Mother     Allergies: No Known Allergies  Prescriptions Prior to Admission  Medication Sig Dispense Refill Last Dose  . acetaminophen (TYLENOL) 500 MG tablet Take 1,000 mg by mouth every 6 (six) hours as  needed for mild pain, moderate pain, fever or headache.   Past Week at Unknown time  . loratadine (CLARITIN) 10 MG tablet Take 1 tablet (10 mg total) by mouth daily. (Patient taking differently: Take 10 mg by mouth daily as needed for allergies or rhinitis. ) 30 tablet 11 Past Month at Unknown time  . omeprazole (PRILOSEC) 20 MG capsule Take 1 capsule by mouth daily.  0 04/05/2017 at Unknown time  . Prenat-Fe Poly-Methfol-FA-DHA (VITAFOL ULTRA) 29-0.6-0.4-200 MG CAPS Take 1 capsule by mouth daily before breakfast. 90 capsule 3 Past Week at Unknown time  . cephALEXin (KEFLEX) 500 MG capsule Take 1 capsule (500 mg total) by mouth 4 (four) times daily. (Patient not taking: Reported on 04/06/2017) 20 capsule 0 Completed Course at Unknown time  . metroNIDAZOLE (FLAGYL) 500 MG tablet Take 1 tablet (500 mg total) by mouth 2 (two) times daily. (Patient not taking: Reported on 04/06/2017) 14 tablet 0 Not Taking at Unknown time     Review of Systems   All systems reviewed and negative except as stated in HPI  Blood pressure 124/84, pulse 82, temperature 98.7 F (37.1 C), temperature source Oral, resp. rate 18, height 5\' 4"  (1.626 m), weight 73.9 kg (163  lb), last menstrual period 07/08/2016. General appearance: alert, cooperative and mild distress from back pain Lungs: clear to auscultation bilaterally Heart: regular rate and rhythm Abdomen: soft, non-tender; bowel sounds normal Extremities: Homans sign is negative, no sign of DVT Presentation: cephalic Fetal monitoringBaseline: 110 bpm, Variability: Good {> 6 bpm) and Accelerations: Non-reactive but appropriate for gestational age Uterine activityDate/time of onset: 4Am, Frequency: Every 1.5-4 minutes, Duration: 80-120 seconds and Intensity: moderate Dilation: 5 Effacement (%): 70 Station: -2 Exam by:: sowder   Prenatal labs: ABO, Rh: A/Positive/-- (01/08 1037) Antibody: Negative (01/08 1037) Rubella: 11.20 (01/08 1037) RPR: Non Reactive (05/16  1100)  HBsAg: Negative (01/08 1037)  HIV:    GBS: Negative (07/12 0000)  1 hr Glucola 112 Genetic screening  Neg (09/14/16) Anatomy US normal   Prenatal Transfer Tool  Maternal Diabetes: No Genetic Screening: Normal Maternal Ultrasounds/Referrals: Normal Fetal Ultrasounds or other Referrals:  Fetal echo Maternal Substance Abuse:  No Significant Maternal Medications:  None Significant Maternal Lab Results: None  Results for orders placed or performed during the hospital encounter of 04/06/17 (from the past 24 hour(s))  CBC   Collection Time: 04/06/17  8:00 AM  Result Value Ref Range   WBC 12.8 (H) 4.0 - 10.5 K/uL   RBC 4.34 3.87 - 5.11 MIL/uL   Hemoglobin 11.2 (L) 12.0 - 15.0 g/dL   HCT 56.235.1 (L) 13.036.0 - 86.546.0 %   MCV 80.9 78.0 - 100.0 fL   MCH 25.8 (L) 26.0 - 34.0 pg   MCHC 31.9 30.0 - 36.0 g/dL   RDW 78.415.1 69.611.5 - 29.515.5 %   Platelets 269 150 - 400 K/uL    Patient Active Problem List   Diagnosis Date Noted  . Normal labor 04/06/2017  . Nausea and vomiting in pregnancy 11/25/2016  . Supervision of normal pregnancy, antepartum 09/14/2016  . Asthma     Assessment/Plan:  Emily Callahan is a 21 y.o. G1P0000 at 169w6d here for SOL, without rupture of membranes.   #Labor:Progressing well, expectant management, will augment if necessary  #Pain: IV fentyl q1hr PRN pain  #FWB: Cat 1  #ID:  n/a #MOF: breastfeeding  #MOC:none  #Circ:  Outpatient.   Ignacia MarvelKendrick C White, MD  04/06/2017, 9:55 AM   I was present for the exam and agree with above.  Katrinka BlazingSmith, [place], CNM 04/06/2017 11:54 AM

## 2017-04-07 MED ORDER — WITCH HAZEL-GLYCERIN EX PADS: 1.0000 "application " | MEDICATED_PAD | CUTANEOUS | Status: DC | PRN

## 2017-04-07 MED ORDER — PRENATAL MULTIVITAMIN CH
1.0000 | ORAL_TABLET | Freq: Every day | ORAL | Status: DC
  Administered 2017-04-07 – 2017-04-08 (×2): 1 via ORAL
  Filled 2017-04-07 (×2): qty 1

## 2017-04-07 MED ORDER — ZOLPIDEM TARTRATE 5 MG PO TABS: 5.0000 mg | ORAL_TABLET | Freq: Every evening | ORAL | Status: DC | PRN

## 2017-04-07 MED ORDER — BENZOCAINE-MENTHOL 20-0.5 % EX AERO
1.0000 "application " | INHALATION_SPRAY | CUTANEOUS | Status: DC | PRN
  Administered 2017-04-07: 1 via TOPICAL
  Filled 2017-04-07: qty 56

## 2017-04-07 MED ORDER — ONDANSETRON HCL 4 MG PO TABS: 4.0000 mg | ORAL_TABLET | ORAL | Status: DC | PRN

## 2017-04-07 MED ORDER — TETANUS-DIPHTH-ACELL PERTUSSIS 5-2.5-18.5 LF-MCG/0.5 IM SUSP: 0.5000 mL | Freq: Once | INTRAMUSCULAR | Status: DC

## 2017-04-07 MED ORDER — COCONUT OIL OIL: 1.0000 "application " | TOPICAL_OIL | Status: DC | PRN

## 2017-04-07 MED ORDER — DIBUCAINE 1 % RE OINT: 1.0000 "application " | TOPICAL_OINTMENT | RECTAL | Status: DC | PRN

## 2017-04-07 MED ORDER — ONDANSETRON HCL 4 MG/2ML IJ SOLN: 4.0000 mg | INTRAMUSCULAR | Status: DC | PRN

## 2017-04-07 MED ORDER — ACETAMINOPHEN 325 MG PO TABS
650.0000 mg | ORAL_TABLET | ORAL | Status: DC | PRN
  Administered 2017-04-07 (×2): 650 mg via ORAL
  Filled 2017-04-07 (×2): qty 2

## 2017-04-07 MED ORDER — SENNOSIDES-DOCUSATE SODIUM 8.6-50 MG PO TABS
2.0000 | ORAL_TABLET | ORAL | Status: DC
  Administered 2017-04-08: 2 via ORAL
  Filled 2017-04-07: qty 2

## 2017-04-07 MED ORDER — DIPHENHYDRAMINE HCL 25 MG PO CAPS: 25.0000 mg | ORAL_CAPSULE | Freq: Four times a day (QID) | ORAL | Status: DC | PRN

## 2017-04-07 MED ORDER — SIMETHICONE 80 MG PO CHEW: 80.0000 mg | CHEWABLE_TABLET | ORAL | Status: DC | PRN

## 2017-04-07 NOTE — Progress Notes (Signed)
POSTPARTUM PROGRESS NOTE  Post Partum Day 1 Subjective:  Emily Callahan is a 21 y.o. G1P1001 5043w6d s/p NSVd.  No acute events overnight.  Pt denies problems with ambulating, voiding or po intake.  She denies nausea or vomiting.  Pain is well controlled.  She has not had flatus. She has not had bowel movement.  Lochia Moderate.   Objective: Blood pressure (!) 103/56, pulse 76, temperature 98.7 F (37.1 C), temperature source Oral, resp. rate 18, height 5\' 4"  (1.626 m), weight 73.9 kg (163 lb), last menstrual period 07/08/2016, SpO2 100 %, unknown if currently breastfeeding.  Physical Exam:  General: alert, cooperative and no distress Lochia:normal flow Chest: CTAB Heart: RRR no m/r/g Abdomen: +BS, soft, nontender,  Uterine Fundus: firm DVT Evaluation: No calf swelling or tenderness Extremities: no edema   Recent Labs  04/06/17 0800  HGB 11.2*  HCT 35.1*    Assessment/Plan:  ASSESSMENT: Emily Callahan is a 21 y.o. G1P1001 7743w6d s/p NSVD  Plan for discharge tomorrow   LOS: 1 day   Sullivan LoneBrannon L Inman, Medical Student   CNM attestation Post Partum Day #1   Emily Callahan is a 21 y.o. G1P1001 s/p SVD.  Pt denies problems with ambulating, voiding or po intake. Pain is well controlled.  Plan for birth control is condoms.  Method of Feeding: breast  Pt is doing well today, but declines early d/c in order to establish breastfeeding.  PE:  BP (!) 103/56   Pulse 76   Temp 98.7 F (37.1 C) (Oral)   Resp 18   Ht 5\' 4"  (1.626 m)   Wt 73.9 kg (163 lb)   LMP 07/08/2016 (Exact Date)   SpO2 100%   Breastfeeding? Unknown   BMI 27.98 kg/m  Fundus firm  Plan for discharge: 04/08/17  Cam HaiSHAW, KIMBERLY, CNM 8:43 AM 04/07/2017

## 2017-04-07 NOTE — Lactation Note (Signed)
This note was copied from a baby's chart. Lactation Consultation Note  Patient Name: Emily Callahan ONGEX'BToday's Date: 04/07/2017 Reason for consult: Follow-up assessment  Mom had called out, wanting help latching to R breast (flat nipple). Mom has noncompressible tissue, so NS at bedside applied (size 24). "Emily Callahan" acted like size 24 was too big for his mouth, so a size 20 was applied and he latched w/ease & suckled more.  Mom using a DEBP. Size 27 flanges were noted on pump set, but Mom needs the size 24, instead. Mom knows that pacifier use is not recommended at this time.   Lurline HareRichey, Kleber Crean [facility] 04/07/2017, 8:07 PM

## 2017-04-07 NOTE — Discharge Instructions (Signed)

## 2017-04-07 NOTE — Anesthesia Postprocedure Evaluation (Signed)
Anesthesia Post Note  Patient: Emily Callahan  Procedure(s) Performed: * No procedures listed *     Patient location during evaluation: Mother Baby Anesthesia Type: Epidural Level of consciousness: awake and alert and oriented Pain management: pain level controlled Vital Signs Assessment: post-procedure vital signs reviewed and stable Respiratory status: spontaneous breathing and nonlabored ventilation Cardiovascular status: stable Postop Assessment: no headache, no signs of nausea or vomiting, no backache, adequate PO intake, epidural receding and patient able to bend at knees Anesthetic complications: no    Last Vitals:  Vitals:   04/07/17 0035 04/07/17 0430  BP: 113/62 (!) 103/56  Pulse: 88 76  Resp: 16 18  Temp: 36.9 C 37.1 C    Last Pain:  Vitals:   04/07/17 0745  TempSrc:   PainSc: 3    Pain Goal:                 [place]akes[place],Meliss Fleek Hristova

## 2017-04-07 NOTE — Lactation Note (Signed)
This note was copied from a baby's chart. Lactation Consultation Note  Patient Name: Emily Callahan NFAOZ'HToday's Date: 04/07/2017 Reason for consult: Initial assessment;Primapara;Infant < 6lbs;1st time breastfeeding Breastfeeding consultation services and support information given and reviewed.  This is mom's first baby and newborn is 1915 hours old.  RN started on nipple shield last night.  Mom feels like baby only takes nipple and comes off and on every few minutes.  Baby is currently sleeping in mom's arms.  Mom has symphony pump set up and just finished pumping.  Drops obtained with pump.  Instructed to call out for [facility] with next feeding.  Maternal Data    Feeding    LATCH Score                   Interventions    Lactation Tools Discussed/Used     Consult Status Consult Status: Follow-up Date: 04/07/17 Follow-up type: In-patient    Huston FoleyMOULDEN, Tarrie Mcmichen S 04/07/2017, 1:54 PM

## 2017-04-08 ENCOUNTER — Ambulatory Visit: Payer: Self-pay

## 2017-04-08 ENCOUNTER — Encounter: Payer: BLUE CROSS/BLUE SHIELD | Admitting: Obstetrics

## 2017-04-08 MED ORDER — IBUPROFEN 600 MG PO TABS: 600.0000 mg | ORAL_TABLET | Freq: Four times a day (QID) | ORAL | 0 refills | Status: DC

## 2017-04-08 NOTE — Discharge Summary (Signed)
OB Discharge Summary  Patient Name: Emily Callahan DOB: 08/07/1996 MRN: [id number]  Date of admission: 04/06/2017 Delivering MD: Shawna ClampBOOKER, KIMBERLY R   Date of discharge: 04/08/2017  Admitting diagnosis: 38WKS,CTX Intrauterine pregnancy: 2228w6d     Secondary diagnosis:Active Problems:   Normal labor   SVD (spontaneous vaginal delivery)      Discharge diagnosis: Term Pregnancy Delivered                                                                      Augmentation: AROM  Complications: None  Hospital course:  Onset of Labor With Vaginal Delivery     21 y.o. yo G1P1001 at 4528w6d was admitted in Active Labor on 04/06/2017. Patient had an uncomplicated labor course as follows:  Membrane Rupture Time/Date: 2:25 PM ,04/06/2017   Intrapartum Procedures: Episiotomy: None [1]                                         Lacerations:  Periurethral [8]  Patient had a delivery of a Viable infant. 04/06/2017  Information for the patient's newborn:  Gean MaidensMiller, Boy Quinne [[id number]][[id number]]  Delivery Method: Vaginal, Spontaneous Delivery (Filed from Delivery Summary)    Pateint had an uncomplicated postpartum course.  She is ambulating, tolerating a regular diet, passing flatus, and urinating well. Patient is discharged home in stable condition on 04/08/17.   Physical exam  Vitals:   04/07/17 0430 04/07/17 1140 04/07/17 1848 04/08/17 0525  BP: (!) 103/56 105/62 117/65 111/63  Pulse: 76 85 79 66  Resp: 18 16 18 16   Temp: 98.7 F (37.1 C)  99 F (37.2 C) 98.4 F (36.9 C)  TempSrc: Oral  Oral Oral  SpO2:  98%    Weight:      Height:       General: alert Lochia: appropriate Uterine Fundus: firm Incision: N/A DVT Evaluation: No evidence of DVT seen on physical exam. Labs: Lab Results  Component Value Date   WBC 12.8 (H) 04/06/2017   HGB 11.2 (L) 04/06/2017   HCT 35.1 (L) 04/06/2017   MCV 80.9 04/06/2017   PLT 269 04/06/2017   CMP Latest Ref Rng & Units 08/03/2016  Glucose 65 - 99  mg/dL 86  BUN 6 - 20 mg/dL 5(L)  Creatinine 8.650.44 - 1.00 mg/dL 7.840.73  Sodium 696135 - 295145 mmol/L 136  Potassium 3.5 - 5.1 mmol/L 3.5  Chloride 101 - 111 mmol/L 105  CO2 22 - 32 mmol/L 24  Calcium 8.9 - 10.3 mg/dL 9.5  Total Protein 6.5 - 8.1 g/dL 8.0  Total Bilirubin 0.3 - 1.2 mg/dL 1.0  Alkaline Phos 38 - 126 U/L 50  AST 15 - 41 U/L 21  ALT 14 - 54 U/L 13(L)    Discharge instruction: per After Visit Summary and "Baby and Me Booklet".  After Visit Meds:  Allergies as of 04/08/2017   No Known Allergies     Medication List    STOP taking these medications   cephALEXin 500 MG capsule Commonly known as:  KEFLEX   metroNIDAZOLE 500 MG tablet Commonly known as:  FLAGYL     TAKE these medications  acetaminophen 500 MG tablet Commonly known as:  TYLENOL Take 1,000 mg by mouth every 6 (six) hours as needed for mild pain, moderate pain, fever or headache.   ibuprofen 600 MG tablet Commonly known as:  ADVIL,MOTRIN Take 1 tablet (600 mg total) by mouth every 6 (six) hours.   loratadine 10 MG tablet Commonly known as:  CLARITIN Take 1 tablet (10 mg total) by mouth daily. What changed:  when to take this  reasons to take this   omeprazole 20 MG capsule Commonly known as:  PRILOSEC Take 1 capsule by mouth daily.   VITAFOL ULTRA 29-0.6-0.4-200 MG Caps Take 1 capsule by mouth daily before breakfast.       Diet: routine diet  Activity: Advance as tolerated. Pelvic rest for 6 weeks.   Outpatient follow up:4 weeks Follow up Appt:Future Appointments Date Time Provider Department Center  05/05/2017 9:00 AM Brock BadHarper, Charles A, MD [facility] None   Follow up visit: No Follow-up on file.  Postpartum contraception: Condoms  Newborn Data: Live born female  Birth Weight: 5 lb 11.2 oz (2586 g) APGAR: 8, 9  Baby Feeding: Breast Disposition:home with mother   04/08/2017 Allie BossierMyra C Darnell Jeschke, MD

## 2017-04-08 NOTE — Progress Notes (Signed)
POSTPARTUM PROGRESS NOTE  Post Partum Day 2 Subjective:  Emily Callahan is a 21 y.o. G1P1001 1077w6d s/p svd.  No acute events overnight.  Pt denies problems with ambulating, voiding or po intake.  She denies nausea or vomiting.  Pain is well controlled.  She has had flatus. She has had bowel movement.  Lochia Small.   Objective: Blood pressure 111/63, pulse 66, temperature 98.4 F (36.9 C), temperature source Oral, resp. rate 16, height 5\' 4"  (1.626 m), weight 73.9 kg (163 lb), last menstrual period 07/08/2016, SpO2 98 %, unknown if currently breastfeeding.  Physical Exam:  General: alert, cooperative and no distress Lochia:normal flow Chest: CTAB Heart: RRR no m/r/g Abdomen: +BS, soft, nontender,  Uterine Fundus: firm DVT Evaluation: No calf swelling or tenderness Extremities: no edema   Recent Labs  04/06/17 0800  HGB 11.2*  HCT 35.1*    Assessment/Plan:  ASSESSMENT: Emily Callahan is a 21 y.o. G1P1001 1477w6d s/p svd  Discharge home   LOS: 2 days   Sullivan LoneBrannon L Erendida Wrenn, Medical Student

## 2017-04-08 NOTE — Lactation Note (Signed)
This note was copied from a baby's chart. Lactation Consultation Note  Patient Name: Emily Callahan ZOXWR'UToday's Date: 04/08/2017 Reason for consult: Follow-up assessment;Infant < 6lbs;Other (Comment);Infant weight loss (supplement per NP - see note , 6.2 % weight loss )  Baby is 136 hours old and per mom is staying until tomorrow.  Mom recently supplemented 14 ml of formula and baby sleeping.  LC reviewed and updated chart.  LC recommended nest feeding to call for feeding assessment by LC.  LC reviewed set up of the DEBP on the initiation phase and encouraged mom to post pump   After every feeding/ and save milk for the next feeding. Also hand express, reviewed and several drops noted.  Mom receptive to teaching.    Maternal Data Has patient been taught Hand Expression?: Yes  Feeding Feeding Type:  (baby  last fed at 0900 )  LATCH Score                   Interventions Interventions: Breast feeding basics reviewed  Lactation Tools Discussed/Used Tools: Shells;Pump (encouraged mom to pump for 15 - 20 mins ) Shell Type: Inverted Breast pump type: Double-Electric Breast Pump   Consult Status Consult Status: Follow-up Date: 04/08/17 Follow-up type: In-patient    Matilde SprangMargaret Ann Vlad Mayberry 04/08/2017, 9:59 AM

## 2017-04-08 NOTE — Lactation Note (Signed)
This note was copied from a baby's chart. Lactation Consultation Note  Patient Name: Boy Ronnette HilaShantia Bonito ZOXWR'UToday's Date: 04/08/2017 Reason for consult: Follow-up assessment  Baby 40 hours old. Asked by Nicki Guadalajararicia, RN to assist with setting up 5 [place] feeding system for supplementing baby. Assisted parents to give baby 10 ml of EBM using NS and 5 [place] feeding system--baby tolerated well and parents report comfort with use.   Maternal Data    Feeding Feeding Type: Breast Milk Length of feed: 7 min  LATCH Score                   Interventions    Lactation Tools Discussed/Used Tools: Nipple Shields;28F feeding tube / Syringe Nipple shield size: 20   Consult Status Consult Status: Follow-up Date: 04/09/17 Follow-up type: In-patient    Sherlyn HayJennifer D Christyne Mccain 04/08/2017, 2:37 PM

## 2017-04-09 ENCOUNTER — Ambulatory Visit: Payer: Self-pay

## 2017-04-09 NOTE — Lactation Note (Signed)
This note was copied from a baby'Callahan chart. Lactation Consultation Note  Patient Name: Emily Callahan GEXBM'WToday'Callahan Date: 04/09/2017  Mom is mildly engorged this AM.  Baby latching with the 20 mm nipple shield and supplementing with expressed milk per bottle or 5 french feeding tube.  Mom given ice packs and cabbage leaves.  Assisted mom with hands on pumping for 20 minutes and 20 mls of transitional milk obtained.  Breasts softened some with pumping.  Instructed to post pump every 2 hours today, continue ice every 2 hours for 20-30 minutes.  Outpatient appointment scheduled for 04/14/17 2:00 pm.   Maternal Data    Feeding Feeding Type: Breast Fed Nipple Type: Slow - flow  LATCH Score Latch: Grasps breast easily, tongue down, lips flanged, rhythmical sucking.  Audible Swallowing: Spontaneous and intermittent  Type of Nipple: Flat  Comfort (Breast/Nipple): Filling, red/small blisters or bruises, mild/mod discomfort  Hold (Positioning): Assistance needed to correctly position infant at breast and maintain latch.  LATCH Score: 7  Interventions    Lactation Tools Discussed/Used Tools: Nipple Emily Callahan   Consult Status      Huston FoleyMOULDEN, Emily Callahan 04/09/2017, 9:47 AM

## 2017-05-05 ENCOUNTER — Encounter: Payer: Self-pay | Admitting: Obstetrics

## 2017-05-05 ENCOUNTER — Ambulatory Visit: Payer: BLUE CROSS/BLUE SHIELD | Admitting: Obstetrics

## 2017-05-05 ENCOUNTER — Ambulatory Visit (INDEPENDENT_AMBULATORY_CARE_PROVIDER_SITE_OTHER): Payer: BLUE CROSS/BLUE SHIELD | Admitting: Obstetrics

## 2017-05-05 DIAGNOSIS — Z3689 Encounter for other specified antenatal screening: Secondary | ICD-10-CM

## 2017-05-05 DIAGNOSIS — Z3009 Encounter for other general counseling and advice on contraception: Secondary | ICD-10-CM

## 2017-05-05 NOTE — Progress Notes (Signed)
Post Partum Exam  Emily Callahan is a 21 y.o. G59P1001 female who presents for a postpartum visit. She is 4 weeks postpartum following a spontaneous vaginal delivery. I have fully reviewed the prenatal and intrapartum course. The delivery was at 39 gestational weeks.  Anesthesia: epidural. Postpartum course has been normal- except patient is having some dizziness with arising. Baby's course has been normal. Baby is feeding by both breast and bottle - Similac Advance. Bleeding no bleeding. Bowel function is normal. Bladder function is normal. Patient is not sexually active. Contraception method is abstinence. Postpartum depression screening:neg  The following portions of the patient's history were reviewed and updated as appropriate: allergies, current medications, past family history, past medical history, past social history, past surgical history and problem list.  Review of Systems A comprehensive review of systems was negative.    Objective:  Breast and bottle feeding.  PE:  Deferred  Assessment:    1. Postpartum care following vaginal delivery - doing well  2. Encounter for other general counseling and advice on contraception - wants to use condoms only  Plan:   1. Contraception: condoms 2. Continue PNV's 3. Follow up in: 4 weeks or as needed.

## 2017-06-02 ENCOUNTER — Ambulatory Visit (INDEPENDENT_AMBULATORY_CARE_PROVIDER_SITE_OTHER): Payer: BLUE CROSS/BLUE SHIELD | Admitting: Obstetrics

## 2017-06-02 DIAGNOSIS — Z3009 Encounter for other general counseling and advice on contraception: Secondary | ICD-10-CM

## 2017-06-03 ENCOUNTER — Encounter: Payer: Self-pay | Admitting: Obstetrics

## 2017-06-03 NOTE — Progress Notes (Signed)
Subjective:     Emily Callahan is a 21 y.o. female who presents for a postpartum visit. She is 4 weeks postpartum following a spontaneous vaginal delivery. I have fully reviewed the prenatal and intrapartum course. The delivery was at 38 gestational weeks. Outcome: spontaneous vaginal delivery. Anesthesia: epidural. Postpartum course has been normal. Baby's course has been normal. Baby is feeding by breast. Bleeding thin lochia. Bowel function is normal. Bladder function is normal. Patient is not sexually active. Contraception method is abstinence. Postpartum depression screening: negative.  Tobacco, alcohol and substance abuse history reviewed.  Adult immunizations reviewed including TDAP, rubella and varicella.  The following portions of the patient's history were reviewed and updated as appropriate: allergies, current medications, past family history, past medical history, past social history, past surgical history and problem list.  Review of Systems A comprehensive review of systems was negative.   Objective:    There were no vitals taken for this visit.  General:  alert and no distress   Breasts:  inspection negative, no nipple discharge or bleeding, no masses or nodularity palpable  Lungs: clear to auscultation bilaterally  Heart:  regular rate and rhythm, S1, S2 normal, no murmur, click, rub or gallop  Abdomen: soft, non-tender; bowel sounds normal; no masses,  no organomegaly   Vulva:  normal  Vagina: normal vagina  Cervix:  no cervical motion tenderness  Corpus: normal size, contour, position, consistency, mobility, non-tender  Adnexa:  no mass, fullness, tenderness  Rectal Exam: Not performed.          50% of 20 min visit spent on counseling and coordination of care.    Assessment:     1. Postpartum care following vaginal delivery - doing well  2. Encounter for other general counseling and advice on contraception - considering options   Plan:    1. Contraception:  considering options 2. Continue PNV's 3. Follow up in: several months or as needed.   Healthy lifestyle practices reviewed

## 2017-12-20 ENCOUNTER — Ambulatory Visit (INDEPENDENT_AMBULATORY_CARE_PROVIDER_SITE_OTHER): Payer: BLUE CROSS/BLUE SHIELD | Admitting: Podiatry

## 2017-12-20 ENCOUNTER — Ambulatory Visit: Payer: BLUE CROSS/BLUE SHIELD

## 2017-12-20 ENCOUNTER — Encounter: Payer: Self-pay | Admitting: Podiatry

## 2017-12-20 DIAGNOSIS — B079 Viral wart, unspecified: Secondary | ICD-10-CM | POA: Diagnosis not present

## 2017-12-20 DIAGNOSIS — L923 Foreign body granuloma of the skin and subcutaneous tissue: Secondary | ICD-10-CM

## 2017-12-20 DIAGNOSIS — S99921A Unspecified injury of right foot, initial encounter: Secondary | ICD-10-CM

## 2017-12-26 NOTE — Progress Notes (Signed)
Subjective:   Patient ID: Emily Callahan, female   DOB: 22 y.o.   MRN: [id number]   HPI Patient states she is developed multiple pain spot on the plantar aspect of the right foot and she does not remember specifics but she thinks she stepped on something several months ago.  Patient does not smoke and likes to be active   Review of Systems  All other systems reviewed and are negative.       Objective:  Physical Exam  Constitutional: She appears well-developed and well-nourished.  Cardiovascular: Intact distal pulses.  Pulmonary/Chest: Effort normal.  Musculoskeletal: Normal range of motion.  Neurological: She is alert.  Skin: Skin is warm.  Nursing note and vitals reviewed.   Neurovascular status intact muscle strength adequate range of motion within normal limits with patient found to have area plantar lateral aspect right.  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  At this point sharp dissection with sterile instruments accomplished and applied chemical agent to create immune response with sterile dressing which was tolerated well.  Explained what to do if any blistering or adverse reaction should occur and reappoint to recheck 1 month or earlier if needed

## 2018-09-07 ENCOUNTER — Other Ambulatory Visit: Payer: Self-pay

## 2018-09-07 ENCOUNTER — Emergency Department (HOSPITAL_COMMUNITY)
Admission: EM | Admit: 2018-09-07 | Discharge: 2018-09-07 | Disposition: A | Discharge status: Home / Self Care | Payer: BLUE CROSS/BLUE SHIELD | Attending: Emergency Medicine | Admitting: Emergency Medicine

## 2018-09-07 ENCOUNTER — Encounter (HOSPITAL_COMMUNITY): Payer: Self-pay | Admitting: Emergency Medicine

## 2018-09-07 DIAGNOSIS — Z79899 Other long term (current) drug therapy: Secondary | ICD-10-CM | POA: Diagnosis not present

## 2018-09-07 DIAGNOSIS — J45909 Unspecified asthma, uncomplicated: Secondary | ICD-10-CM | POA: Diagnosis not present

## 2018-09-07 DIAGNOSIS — G43009 Migraine without aura, not intractable, without status migrainosus: Secondary | ICD-10-CM | POA: Diagnosis not present

## 2018-09-07 DIAGNOSIS — R51 Headache: Secondary | ICD-10-CM | POA: Diagnosis not present

## 2018-09-07 LAB — CBC
HCT: 38.4 % (ref 36.0–46.0)
Hemoglobin: 11.5 g/dL — ABNORMAL LOW (ref 12.0–15.0)
MCH: 24.8 pg — ABNORMAL LOW (ref 26.0–34.0)
MCHC: 29.9 g/dL — AB (ref 30.0–36.0)
MCV: 82.8 fL (ref 80.0–100.0)
Platelets: 223 10*3/uL (ref 150–400)
RBC: 4.64 MIL/uL (ref 3.87–5.11)
RDW: 16.9 % — AB (ref 11.5–15.5)
WBC: 8.9 10*3/uL (ref 4.0–10.5)
nRBC: 0 % (ref 0.0–0.2)

## 2018-09-07 LAB — URINALYSIS, ROUTINE W REFLEX MICROSCOPIC
BACTERIA UA: NONE SEEN
Bilirubin Urine: NEGATIVE
Glucose, UA: NEGATIVE mg/dL
Hgb urine dipstick: NEGATIVE
Ketones, ur: 5 mg/dL — AB
Leukocytes, UA: NEGATIVE
Nitrite: NEGATIVE
Protein, ur: 30 mg/dL — AB
SPECIFIC GRAVITY, URINE: 1.025 (ref 1.005–1.030)
pH: 7 (ref 5.0–8.0)

## 2018-09-07 LAB — COMPREHENSIVE METABOLIC PANEL
ALK PHOS: 35 U/L — AB (ref 38–126)
ALT: 13 U/L (ref 0–44)
AST: 20 U/L (ref 15–41)
Albumin: 4.2 g/dL (ref 3.5–5.0)
Anion gap: 9 (ref 5–15)
BUN: 7 mg/dL (ref 6–20)
CHLORIDE: 107 mmol/L (ref 98–111)
CO2: 26 mmol/L (ref 22–32)
CREATININE: 0.73 mg/dL (ref 0.44–1.00)
Calcium: 8.9 mg/dL (ref 8.9–10.3)
GFR calc Af Amer: >60 mL/min (ref >60)
Glucose, Bld: 86 mg/dL (ref 70–99)
Potassium: 3.7 mmol/L (ref 3.5–5.1)
Sodium: 142 mmol/L (ref 135–145)
Total Bilirubin: 0.4 mg/dL (ref 0.3–1.2)
Total Protein: 8 g/dL (ref 6.5–8.1)

## 2018-09-07 LAB — I-STAT BETA HCG BLOOD, ED (MC, WL, AP ONLY)

## 2018-09-07 LAB — LIPASE, BLOOD: LIPASE: 32 U/L (ref 11–51)

## 2018-09-07 MED ORDER — ONDANSETRON 4 MG PO TBDP
4.0000 mg | ORAL_TABLET | Freq: Once | ORAL | Status: AC | PRN
  Administered 2018-09-07: 4 mg via ORAL
  Filled 2018-09-07: qty 1

## 2018-09-07 MED ORDER — DEXAMETHASONE SODIUM PHOSPHATE 10 MG/ML IJ SOLN
10.0000 mg | Freq: Once | INTRAMUSCULAR | Status: AC
  Administered 2018-09-07: 10 mg via INTRAVENOUS
  Filled 2018-09-07: qty 1

## 2018-09-07 MED ORDER — SODIUM CHLORIDE 0.9 % IV BOLUS
1000.0000 mL | Freq: Once | INTRAVENOUS | Status: AC
  Administered 2018-09-07: 1000 mL via INTRAVENOUS

## 2018-09-07 MED ORDER — DIPHENHYDRAMINE HCL 50 MG/ML IJ SOLN
12.5000 mg | Freq: Once | INTRAMUSCULAR | Status: AC
  Administered 2018-09-07: 12.5 mg via INTRAVENOUS
  Filled 2018-09-07: qty 1

## 2018-09-07 MED ORDER — METOCLOPRAMIDE HCL 5 MG/ML IJ SOLN
10.0000 mg | Freq: Once | INTRAMUSCULAR | Status: AC
  Administered 2018-09-07: 10 mg via INTRAVENOUS
  Filled 2018-09-07: qty 2

## 2018-09-07 NOTE — ED Triage Notes (Signed)
Worsening migraine for 2 days; woke up with chills and vomiting; denies other.

## 2018-09-07 NOTE — Discharge Instructions (Addendum)
You were evaluated today for migraine.  You stated your headache resolved with a migraine cocktail you are given today.  We will continue to work over the next 2-3 days.  Please keep your follow-up appointment with your PCP for reevaluation of your chronic headaches.  Return to the ED for any of your new or worsening symptoms.

## 2018-09-07 NOTE — ED Provider Notes (Signed)
[facility]-EMERGENCY DEPT Provider Note   CSN: [id number] Arrival date & time: 09/07/18  1648   History   Chief Complaint Chief Complaint  Patient presents with  . Migraine    HPI Emily Callahan is a 23 y.o. female with past medical history significant for migraine, asthma, IBS who presents for evaluation of headache.  Patient states this pain has been present x2 days.  Has taken Tylenol twice without relief of symptoms.  Patient states she has had nausea and vomiting.  States she is had photophobia.  Denies any recent infections.  Denies lightheadedness, dizziness, vision changes, eye pain, facial asymmetry, slurred speech, neck pain, neck stiffness, unilateral weakness, body aches and pains, chest pain, shortness of breath, abdominal pain, dysuria, diarrhea or constipation.  She states headache began gradually.  Denies sudden onset thunderclap headache.  Denies use of anticoagulation, trauma or injury to her head. Patient states she does have history of previous headaches, this feels similar to her previous headaches.  Rates her pain a 9/10.  Pain does not radiate.  Pain is located over her right forehead and temporal region.  Patient did have one episode of nonbilious emesis while in triage.  Patient states this is "usually happens when I have a headache."  Patient states she did have a normal head CT approximately 1 year ago for her frequent headaches.  She does have a follow-up appointment in 1 week with her PCP to discuss her frequent headaches.  Denies additional aggravating or alleviating factors.  History obtained from patient.  No interpreter was used.  HPI  Past Medical History:  Diagnosis Date  . Asthma   . IBS (irritable bowel syndrome)   . Pregnant     Patient Active Problem List   Diagnosis Date Noted  . SVD (spontaneous vaginal delivery) 04/07/2017  . Normal labor 04/06/2017  . Nausea and vomiting in pregnancy 11/25/2016  . Supervision of normal  pregnancy, antepartum 09/14/2016  . Asthma     Past Surgical History:  Procedure Laterality Date  . MOUTH SURGERY     Wisdom teeth "pulled"      OB History    Gravida  1   Para  1   Term  1   Preterm  0   AB  0   Living  1     SAB  0   TAB  0   Ectopic  0   Multiple  0   Live Births  1            Home Medications    Prior to Admission medications   Medication Sig Start Date End Date Taking? Authorizing Provider  acetaminophen (TYLENOL) 500 MG tablet Take 1,000 mg by mouth every 6 (six) hours as needed for mild pain, moderate pain, fever or headache.   Yes [provider]  ibuprofen (ADVIL,MOTRIN) 600 MG tablet Take 1 tablet (600 mg total) by mouth every 6 (six) hours. Patient not taking: Reported on 09/07/2018 04/08/17   Allie Bossierove, Myra C, MD  loratadine (CLARITIN) 10 MG tablet Take 1 tablet (10 mg total) by mouth daily. Patient not taking: Reported on 09/07/2018 10/28/16   Brock BadHarper, Charles A, MD  Prenat-Fe Poly-Methfol-FA-DHA (VITAFOL ULTRA) 29-0.6-0.4-200 MG CAPS Take 1 capsule by mouth daily before breakfast. Patient not taking: Reported on 09/07/2018 09/14/16   Brock BadHarper, Charles A, MD    Family History Family History  Problem Relation Age of Onset  . Hypertension Mother   . Cancer Mother   .  Stroke Mother     Social History Social History   Tobacco Use  . Smoking status: Never Smoker  . Smokeless tobacco: Never Used  Substance Use Topics  . Alcohol use: No    Comment: no usage since + preg test   . Drug use: No    Types: Marijuana    Comment: No usage since + preg. test     Allergies   Patient has no known allergies.   Review of Systems Review of Systems  Constitutional: Negative.   HENT: Negative.   Eyes: Positive for photophobia. Negative for pain, redness and visual disturbance.  Respiratory: Negative.   Cardiovascular: Negative.   Gastrointestinal: Positive for nausea and vomiting. Negative for abdominal distention, abdominal pain,  anal bleeding, blood in stool, constipation, diarrhea and rectal pain.  Genitourinary: Negative.   Musculoskeletal: Negative.   Skin: Negative.   Neurological: Positive for headaches. Negative for dizziness, tremors, seizures, syncope, facial asymmetry, speech difficulty, weakness, light-headedness and numbness.  All other systems reviewed and are negative.    Physical Exam Updated Vital Signs BP (!) 98/52   Pulse 71   Temp 98.1 F (36.7 C) (Oral)   Resp 15   Ht 5\' 5"  (1.651 m)   Wt 61.8 kg   LMP 09/07/2018   SpO2 100%   BMI 22.66 kg/m   Physical Exam  Physical Exam  Constitutional: Pt is oriented to person, place, and time. Pt appears well-developed and well-nourished. No distress.  HENT:  Head: Normocephalic and atraumatic.  Mouth/Throat: Oropharynx is clear and moist.  Eyes: Conjunctivae and EOM are normal. Pupils are equal, round, and reactive to light. No scleral icterus.  No horizontal, vertical or rotational nystagmus  Neck: Normal range of motion. Neck supple.  Full active and passive ROM without pain No midline or paraspinal tenderness No nuchal rigidity or meningeal signs  Cardiovascular: Normal rate, regular rhythm and intact distal pulses.   Pulmonary/Chest: Effort normal and breath sounds normal. No respiratory distress. Pt has no wheezes. No rales.  Abdominal: Soft. Bowel sounds are normal. There is no tenderness. There is no rebound and no guarding.  Musculoskeletal: Normal range of motion.  Lymphadenopathy:    No cervical adenopathy.  Neurological: Pt. is alert and oriented to person, place, and time. He has normal reflexes. No cranial nerve deficit.  Exhibits normal muscle tone. Coordination normal.  Mental Status:  Alert, oriented, thought content appropriate. Speech fluent without evidence of aphasia. Able to follow 2 step commands without difficulty.  Cranial Nerves:  II:  Peripheral visual fields grossly normal, pupils equal, round, reactive to  light III,IV, VI: ptosis not present, extra-ocular motions intact bilaterally  V,VII: smile symmetric, facial light touch sensation equal VIII: hearing grossly normal bilaterally  IX,X: midline uvula rise  XI: bilateral shoulder shrug equal and strong XII: midline tongue extension  Motor:  5/5 in upper and lower extremities bilaterally including strong and equal grip strength and dorsiflexion/plantar flexion Sensory: Pinprick and light touch normal in all extremities.  Deep Tendon Reflexes: 2+ and symmetric  Cerebellar: normal finger-to-nose with bilateral upper extremities Gait: normal gait and balance CV: distal pulses palpable throughout   Skin: Skin is warm and dry. No rash noted. Pt is not diaphoretic.  Psychiatric: Pt has a normal mood and affect. Behavior is normal. Judgment and thought content normal.  Nursing note and vitals reviewed. ED Treatments / Results  Labs (all labs ordered are listed, but only abnormal results are displayed) Labs Reviewed  COMPREHENSIVE METABOLIC  PANEL - Abnormal; Notable for the following components:      Result Value   Alkaline Phosphatase 35 (*)    All other components within normal limits  CBC - Abnormal; Notable for the following components:   Hemoglobin 11.5 (*)    MCH 24.8 (*)    MCHC 29.9 (*)    RDW 16.9 (*)    All other components within normal limits  URINALYSIS, ROUTINE W REFLEX MICROSCOPIC - Abnormal; Notable for the following components:   Ketones, ur 5 (*)    Protein, ur 30 (*)    All other components within normal limits  LIPASE, BLOOD  I-STAT BETA HCG BLOOD, ED ([facility], [facility], [facility] ONLY)    EKG None  Radiology No results found.  Procedures Procedures (including critical care time)  Medications Ordered in ED Medications  ondansetron (ZOFRAN-ODT) disintegrating tablet 4 mg (4 mg Oral Given 09/07/18 1657)  sodium chloride 0.9 % bolus 1,000 mL (0 mLs Intravenous Stopped 09/07/18 2250)  metoCLOPramide (REGLAN) injection 10 mg (10 mg  Intravenous Given 09/07/18 2122)  diphenhydrAMINE (BENADRYL) injection 12.5 mg (12.5 mg Intravenous Given 09/07/18 2123)  dexamethasone (DECADRON) injection 10 mg (10 mg Intravenous Given 09/07/18 2125)     Initial Impression / Assessment and Plan / ED Course  I have reviewed the triage vital signs and the nursing notes.  Pertinent labs & imaging results that were available during my care of the patient were reviewed by me and considered in my medical decision making (see chart for details).  23 year old female who appears otherwise well presents for evaluation of headache.  Has history of headaches.  Patient received a CT scan approximately 1 year ago for her chronic headaches.  Patient states this feels similar to her previous headaches.  She did have one episode of emesis in triage.  No recent infections.  Afebrile, nonseptic, non-ill-appearing.  Neck without stiffness or rigidity.  Nonfocal neurologic exam without neurologic deficits. I do not feel patient needs repeat imaging at this time logic exam and previous history of similar symptoms.  Labs were collected prior to my evaluation.  hCG negative, metabolic panel without evidence of electrolyte, renal or liver abnormalities, lipase 32, urinalysis negative for infection, CBC without leukocytosis, hemoglobin 11.5.  Abdominal soft, nontender without rebound or guarding.  Will provide migraine cocktail reevaluate.  On reevaluation patient states she has no head pain.  States she would like to DC home at this time.  No episodes of emesis since given medication.  Continued nonfocal neurologic exam without neurologic deficits. HA [facility], [facility], Meningitis, or temporal arteritis. Pt is afebrile with no focal neuro deficits, nuchal rigidity, or change in vision. Pt is to follow up with PCP to discuss prophylactic medication.  She is hemodynamically stable and appropriate for DC home at this time.  I have discussed return precautions with patient.   Patient voiced understanding and is agreeable for follow-up.    Final Clinical Impressions(s) / ED Diagnoses   Final diagnoses:  Migraine without aura and without status migrainosus, not intractable    ED Discharge Orders    None       Nohlan Burdin A, PA-C 09/07/18 2316    Margarita Grizzleay, Danielle, MD 09/07/18 2348

## 2018-09-07 NOTE — ED Notes (Signed)
Actively vomiting with triage; administering zofran for such/comfort.

## 2020-05-08 ENCOUNTER — Emergency Department (HOSPITAL_COMMUNITY)
Admission: EM | Admit: 2020-05-08 | Discharge: 2020-05-08 | Disposition: A | Discharge status: Home / Self Care | Payer: Medicaid Other | Attending: Emergency Medicine | Admitting: Emergency Medicine

## 2020-05-08 ENCOUNTER — Encounter (HOSPITAL_COMMUNITY): Payer: Self-pay | Admitting: Emergency Medicine

## 2020-05-08 ENCOUNTER — Other Ambulatory Visit: Payer: Self-pay

## 2020-05-08 ENCOUNTER — Emergency Department (HOSPITAL_COMMUNITY): Payer: Medicaid Other

## 2020-05-08 DIAGNOSIS — L539 Erythematous condition, unspecified: Secondary | ICD-10-CM | POA: Insufficient documentation

## 2020-05-08 DIAGNOSIS — J039 Acute tonsillitis, unspecified: Secondary | ICD-10-CM | POA: Insufficient documentation

## 2020-05-08 DIAGNOSIS — Z79899 Other long term (current) drug therapy: Secondary | ICD-10-CM | POA: Insufficient documentation

## 2020-05-08 DIAGNOSIS — J45909 Unspecified asthma, uncomplicated: Secondary | ICD-10-CM | POA: Insufficient documentation

## 2020-05-08 DIAGNOSIS — N83201 Unspecified ovarian cyst, right side: Secondary | ICD-10-CM | POA: Diagnosis not present

## 2020-05-08 DIAGNOSIS — Z7951 Long term (current) use of inhaled steroids: Secondary | ICD-10-CM | POA: Diagnosis not present

## 2020-05-08 DIAGNOSIS — R079 Chest pain, unspecified: Secondary | ICD-10-CM | POA: Diagnosis not present

## 2020-05-08 DIAGNOSIS — Z20822 Contact with and (suspected) exposure to covid-19: Secondary | ICD-10-CM | POA: Diagnosis not present

## 2020-05-08 DIAGNOSIS — J3489 Other specified disorders of nose and nasal sinuses: Secondary | ICD-10-CM | POA: Diagnosis not present

## 2020-05-08 DIAGNOSIS — R109 Unspecified abdominal pain: Secondary | ICD-10-CM | POA: Diagnosis present

## 2020-05-08 LAB — COMPREHENSIVE METABOLIC PANEL
ALT: 11 U/L (ref 0–44)
AST: 15 U/L (ref 15–41)
Albumin: 3.9 g/dL (ref 3.5–5.0)
Alkaline Phosphatase: 42 U/L (ref 38–126)
Anion gap: 12 (ref 5–15)
BUN: 6 mg/dL (ref 6–20)
CO2: 25 mmol/L (ref 22–32)
Calcium: 9.2 mg/dL (ref 8.9–10.3)
Chloride: 101 mmol/L (ref 98–111)
Creatinine, Ser: 0.69 mg/dL (ref 0.44–1.00)
GFR calc Af Amer: >60 mL/min (ref >60)
GFR calc non Af Amer: >60 mL/min (ref >60)
Glucose, Bld: 95 mg/dL (ref 70–99)
Potassium: 3.9 mmol/L (ref 3.5–5.1)
Sodium: 138 mmol/L (ref 135–145)
Total Bilirubin: 0.6 mg/dL (ref 0.3–1.2)
Total Protein: 8 g/dL (ref 6.5–8.1)

## 2020-05-08 LAB — SARS CORONAVIRUS 2 BY RT PCR (HOSPITAL ORDER, PERFORMED IN ~~LOC~~ HOSPITAL LAB): SARS Coronavirus 2: NEGATIVE

## 2020-05-08 LAB — URINALYSIS, ROUTINE W REFLEX MICROSCOPIC
Bilirubin Urine: NEGATIVE
Glucose, UA: NEGATIVE mg/dL
Ketones, ur: NEGATIVE mg/dL
Nitrite: NEGATIVE
Protein, ur: NEGATIVE mg/dL
Specific Gravity, Urine: 1.006 (ref 1.005–1.030)
pH: 8 (ref 5.0–8.0)

## 2020-05-08 LAB — CBC
HCT: 36 % (ref 36.0–46.0)
Hemoglobin: 11.1 g/dL — ABNORMAL LOW (ref 12.0–15.0)
MCH: 25.8 pg — ABNORMAL LOW (ref 26.0–34.0)
MCHC: 30.8 g/dL (ref 30.0–36.0)
MCV: 83.5 fL (ref 80.0–100.0)
Platelets: 227 10*3/uL (ref 150–400)
RBC: 4.31 MIL/uL (ref 3.87–5.11)
RDW: 15.6 % — ABNORMAL HIGH (ref 11.5–15.5)
WBC: 19 10*3/uL — ABNORMAL HIGH (ref 4.0–10.5)
nRBC: 0 % (ref 0.0–0.2)

## 2020-05-08 LAB — TROPONIN I (HIGH SENSITIVITY): Troponin I (High Sensitivity): <2 ng/L (ref <18)

## 2020-05-08 LAB — I-STAT BETA HCG BLOOD, ED (MC, WL, AP ONLY): I-stat hCG, quantitative: <5 m[IU]/mL (ref <5)

## 2020-05-08 LAB — GROUP A STREP BY PCR: Group A Strep by PCR: NOT DETECTED

## 2020-05-08 LAB — LIPASE, BLOOD: Lipase: 22 U/L (ref 11–51)

## 2020-05-08 MED ORDER — IOHEXOL 300 MG/ML  SOLN
100.0000 mL | Freq: Once | INTRAMUSCULAR | Status: AC | PRN
  Administered 2020-05-08: 100 mL via INTRAVENOUS

## 2020-05-08 MED ORDER — KETOROLAC TROMETHAMINE 15 MG/ML IJ SOLN
15.0000 mg | Freq: Once | INTRAMUSCULAR | Status: AC
  Administered 2020-05-08: 22:00:00 15 mg via INTRAVENOUS
  Filled 2020-05-08: qty 1

## 2020-05-08 MED ORDER — SODIUM CHLORIDE 0.9 % IV BOLUS
1000.0000 mL | Freq: Once | INTRAVENOUS | Status: AC
  Administered 2020-05-08: 22:00:00 1000 mL via INTRAVENOUS

## 2020-05-08 MED ORDER — DEXAMETHASONE SODIUM PHOSPHATE 10 MG/ML IJ SOLN
10.0000 mg | Freq: Once | INTRAMUSCULAR | Status: AC
  Administered 2020-05-08: 22:00:00 10 mg via INTRAVENOUS
  Filled 2020-05-08: qty 1

## 2020-05-08 MED ORDER — NAPROXEN 500 MG PO TABS: 500.0000 mg | ORAL_TABLET | Freq: Two times a day (BID) | ORAL | 0 refills | Status: AC

## 2020-05-08 MED ORDER — CLINDAMYCIN HCL 150 MG PO CAPS: 300.0000 mg | ORAL_CAPSULE | Freq: Three times a day (TID) | ORAL | 0 refills | Status: AC

## 2020-05-08 MED ORDER — CLINDAMYCIN PHOSPHATE 600 MG/50ML IV SOLN
600.0000 mg | Freq: Once | INTRAVENOUS | Status: AC
  Administered 2020-05-08: 22:00:00 600 mg via INTRAVENOUS
  Filled 2020-05-08: qty 50

## 2020-05-08 NOTE — ED Triage Notes (Signed)
Pt having abd pains with sore throat on left side and when swallows left ear hurts. Symptoms on going for a couple days.

## 2020-05-08 NOTE — ED Provider Notes (Signed)
[facility]-EMERGENCY DEPT Provider Note   CSN: [id number] Arrival date & time: 05/08/20  1722     History Chief Complaint  Patient presents with  . Sore Throat  . Otalgia    left  . Abdominal Pain    Emily Callahan is a 24 y.o. female presenting for evaluation of ST, CP, abd abd pain.   Patient states several days ago she developed right lower quadrant abdominal pain.  Over the next 4 days, she developed left-sided throat and ear pain, chest pain, nasal congestion, and overall does not feel well.  She denies sick contacts.  She denies fevers, chills, cough, shortness of breath, nausea, vomiting, urinary symptoms, abnormal bowel movements.  She has not taken anything for her symptoms.  She denies vaginal discharge.  She is sexually active with one female partner, they do not use condoms or contraception.  Her last period was mid August, was normal for her.  She reports a history of asthma and IBS, does not take medications daily.  HPI     Past Medical History:  Diagnosis Date  . Asthma   . IBS (irritable bowel syndrome)   . Pregnant     Patient Active Problem List   Diagnosis Date Noted  . SVD (spontaneous vaginal delivery) 04/07/2017  . Normal labor 04/06/2017  . Nausea and vomiting in pregnancy 11/25/2016  . Supervision of normal pregnancy, antepartum 09/14/2016  . Asthma     Past Surgical History:  Procedure Laterality Date  . MOUTH SURGERY     Wisdom teeth "pulled"      OB History    Gravida  1   Para  1   Term  1   Preterm  0   AB  0   Living  1     SAB  0   TAB  0   Ectopic  0   Multiple  0   Live Births  1           Family History  Problem Relation Age of Onset  . Hypertension Mother   . Cancer Mother   . Stroke Mother     Social History   Tobacco Use  . Smoking status: Never Smoker  . Smokeless tobacco: Never Used  Substance Use Topics  . Alcohol use: No    Comment: no usage since + preg test   . Drug  use: No    Types: Marijuana    Comment: No usage since + preg. test    Home Medications Prior to Admission medications   Medication Sig Start Date End Date Taking? Authorizing Provider  acetaminophen (TYLENOL) 500 MG tablet Take 1,000 mg by mouth every 6 (six) hours as needed for mild pain, moderate pain, fever or headache.   Yes [provider]  albuterol (VENTOLIN HFA) 108 (90 Base) MCG/ACT inhaler Inhale 2 puffs into the lungs every 4 (four) hours as needed for wheezing or shortness of breath.  12/14/19  Yes [provider]  clindamycin (CLEOCIN) 150 MG capsule Take 2 capsules (300 mg total) by mouth 3 (three) times daily for 7 days. 05/08/20 05/15/20  Jaiona Simien, PA-C  ibuprofen (ADVIL,MOTRIN) 600 MG tablet Take 1 tablet (600 mg total) by mouth every 6 (six) hours. Patient not taking: Reported on 09/07/2018 04/08/17   Allie Bossier, MD  loratadine (CLARITIN) 10 MG tablet Take 1 tablet (10 mg total) by mouth daily. Patient not taking: Reported on 09/07/2018 10/28/16   Brock Bad, MD  naproxen (NAPROSYN) 500 MG tablet Take 1 tablet (500 mg total) by mouth 2 (two) times daily with a meal for 10 days. 05/08/20 05/18/20  Demauri Advincula, PA-C  Prenat-Fe Poly-Methfol-FA-DHA (VITAFOL ULTRA) 29-0.6-0.4-200 MG CAPS Take 1 capsule by mouth daily before breakfast. Patient not taking: Reported on 09/07/2018 09/14/16   Brock BadHarper, Charles A, MD    Allergies    Patient has no known allergies.  Review of Systems   Review of Systems  HENT: Positive for congestion and sore throat.   Cardiovascular: Positive for chest pain.  Gastrointestinal: Positive for abdominal pain.  All other systems reviewed and are negative.   Physical Exam Updated Vital Signs BP 120/78 (BP Location: Right Arm)   Pulse 80   Temp 98.1 F (36.7 C) (Oral)   Resp 18   LMP 04/16/2020   SpO2 98%   Physical Exam Vitals and nursing note reviewed.  Constitutional:      General: She is not in acute distress.     Appearance: She is well-developed. She is ill-appearing.     Comments: Appears as if she feels ill, but nontoxic/in no distress  HENT:     Head: Normocephalic and atraumatic.     Nose: Rhinorrhea present. Rhinorrhea is clear.     Mouth/Throat:     Mouth: Mucous membranes are moist.     Pharynx: Posterior oropharyngeal erythema present.     Tonsils: Tonsillar exudate present.     Comments: Left tonsil swollen and with clear exudate.  However it is not causing uvular deviation or trismus at this time.  No muffled voice. Eyes:     Conjunctiva/sclera: Conjunctivae normal.     Pupils: Pupils are equal, round, and reactive to light.  Cardiovascular:     Rate and Rhythm: Normal rate and regular rhythm.     Pulses: Normal pulses.  Pulmonary:     Effort: Pulmonary effort is normal. No respiratory distress.     Breath sounds: Normal breath sounds. No wheezing.     Comments: Clear lung sounds in all fields Abdominal:     General: There is no distension.     Palpations: Abdomen is soft. There is no mass.     Tenderness: There is abdominal tenderness. There is no guarding or rebound.     Comments: Tenderness palpation of right lower quadrant abdomen.  No rigidity, guarding, distention.  Negative rebound.  No peritonitis.  Musculoskeletal:        General: Normal range of motion.     Cervical back: Normal range of motion and neck supple.  Skin:    General: Skin is warm and dry.     Capillary Refill: Capillary refill takes less than 2 seconds.  Neurological:     Mental Status: She is alert and oriented to person, place, and time.     ED Results / Procedures / Treatments   Labs (all labs ordered are listed, but only abnormal results are displayed) Labs Reviewed  CBC - Abnormal; Notable for the following components:      Result Value   WBC 19.0 (*)    Hemoglobin 11.1 (*)    MCH 25.8 (*)    RDW 15.6 (*)    All other components within normal limits  URINALYSIS, ROUTINE W REFLEX MICROSCOPIC  - Abnormal; Notable for the following components:   Color, Urine STRAW (*)    APPearance HAZY (*)    Hgb urine dipstick SMALL (*)    Leukocytes,Ua LARGE (*)    Bacteria, UA  FEW (*)    All other components within normal limits  SARS CORONAVIRUS 2 BY RT PCR (HOSPITAL ORDER, PERFORMED IN [facility])  GROUP A STREP BY PCR  LIPASE, BLOOD  COMPREHENSIVE METABOLIC PANEL  I-STAT BETA HCG BLOOD, ED ([facility], [facility], [facility] ONLY)  TROPONIN I (HIGH SENSITIVITY)    EKG None  Radiology CT ABDOMEN PELVIS W CONTRAST  Result Date: 05/08/2020 CLINICAL DATA:  Right lower quadrant abdominal pain EXAM: CT ABDOMEN AND PELVIS WITH CONTRAST TECHNIQUE: Multidetector CT imaging of the abdomen and pelvis was performed using the standard protocol following bolus administration of intravenous contrast. CONTRAST:  OMNIPAQUE IOHEXOL 300 MG/ML  SOLN COMPARISON:  None. FINDINGS: Lower chest: The visualized heart size within normal limits. No pericardial fluid/thickening. No hiatal hernia. The visualized portions of the lungs are clear. Hepatobiliary: The liver is normal in density without focal abnormality.The main portal vein is patent. No evidence of calcified gallstones, gallbladder wall thickening or biliary dilatation. Pancreas: Unremarkable. No pancreatic ductal dilatation or surrounding inflammatory changes. Spleen: Normal in size without focal abnormality. Adrenals/Urinary Tract: Both adrenal glands appear normal. The kidneys and collecting system appear normal without evidence of urinary tract calculus or hydronephrosis. Bladder is unremarkable. Stomach/Bowel: The stomach, small bowel, and colon are normal in appearance. No inflammatory changes, wall thickening, or obstructive findings.The appendix is normal. Vascular/Lymphatic: There are no enlarged mesenteric, retroperitoneal, or pelvic lymph nodes. No significant vascular findings are present. Reproductive: There is a probable right adnexal corpus luteum  cyst. A moderate amount of fluid seen within the deep pelvis. Other: No evidence of abdominal wall mass or hernia. Musculoskeletal: No acute or significant osseous findings. IMPRESSION: Normal appearing appendix. Probable right corpus luteum cyst with a moderate amount of fluid in the deep pelvis. Electronically Signed   By: Jonna Clark M.D.   On: 05/08/2020 21:56    Procedures Procedures (including critical care time)  Medications Ordered in ED Medications  sodium chloride 0.9 % bolus 1,000 mL (0 mLs Intravenous Stopped 05/08/20 2258)  ketorolac (TORADOL) 15 MG/ML injection 15 mg (15 mg Intravenous Given 05/08/20 2141)  dexamethasone (DECADRON) injection 10 mg (10 mg Intravenous Given 05/08/20 2151)  clindamycin (CLEOCIN) IVPB 600 mg (0 mg Intravenous Stopped 05/08/20 2242)  iohexol (OMNIPAQUE) 300 MG/ML solution 100 mL (100 mLs Intravenous Contrast Given 05/08/20 2141)    ED Course  I have reviewed the triage vital signs and the nursing notes.  Pertinent labs & imaging results that were available during my care of the patient were reviewed by me and considered in my medical decision making (see chart for details).    MDM Rules/Calculators/A&P                          Patient presenting for evaluation of sore throat, chest pain, abdominal pain.  On exam, patient has clear exudate of the right tonsil.  However is not causing uvular deviation or trismus as of now.  Concern for early peritonsillar abscess versus tonsillitis.  I do not believe she needs an emergent procedure at this time.  Will treat with fluids, Decadron, Toradol, and clindamycin.  Patient also reporting chest pain.  This is likely related to her illness is causing her sore throat, however as she reports worsened pain when she leans forward, will obtain EKG and troponin to assess for pericarditis/myocarditis.  Patient with focal right lower quadrant abdominal pain.  This may be reactive, however also have to consider appendicitis.  Discussed with attending, Dr. Rubin Payor agrees plan, will obtain CT abdomen pelvis.  Labs obtained from triage show leukocytosis of 19.  Otherwise labs are reassuring.  Patient does have large leuks and few bacteria, however she has no urinary symptoms.  Will not treat for asymptomatic bacteriuria.  CT abdomen pelvis negative for appendicitis, does show a right ovarian cyst.  This would explain patient's right lower quadrant abdominal pain.  EKG without ST elevation or signs of pericarditis/myocarditis.  Troponin negative.  On reassessment, patient reports complete resolution of abdominal pain, improvement of chest and throat pain.  Discussed likely tonsillitis/early peritonsillar abscess.  Discussed importance of taking antibiotics.  NSAIDs for inflammation and pain control.  At this time, patient appears safe for discharge.  Return precautions given.  Patient states she understands and agrees to plan.  Final Clinical Impression(s) / ED Diagnoses Final diagnoses:  Tonsillitis  Right ovarian cyst    Rx / DC Orders ED Discharge Orders         Ordered    clindamycin (CLEOCIN) 150 MG capsule  3 times daily        05/08/20 2304    naproxen (NAPROSYN) 500 MG tablet  2 times daily with meals        05/08/20 2304           Lesly Pontarelli, PA-C 05/08/20 2332    Benjiman Core, MD 05/13/20 [phone]

## 2020-05-08 NOTE — Discharge Instructions (Signed)
Take antibiotics as prescribed. Take the entire course, even if your symptoms improve. Take naproxen 2 times a day with meals as needed for throat, chest, or abdominal pain.  Do not take other anti-inflammatories at the same time (Advil, Motrin, ibuprofen, Aleve). You may supplement with Tylenol if you need further pain control. Make sure you are staying well hydrated with water.  Follow up with the ear, nose and throat doctor listed below if your voice becomes muffled, you are unable to open your mouth, or you are having difficulty swallowing your spit.  Your covid test is pending. If positive you should receive a phone call. If negative, you will not. Either way you may check online on MyChart.  Follow up with ob/gyn or primary care as needed for further evaluation of your cyst on your ovary.  Return to the ER with any new, worsening, or concerning symptoms.

## 2020-12-25 ENCOUNTER — Other Ambulatory Visit: Payer: Self-pay

## 2020-12-25 ENCOUNTER — Encounter: Payer: Self-pay | Admitting: Emergency Medicine

## 2020-12-25 ENCOUNTER — Ambulatory Visit
Admission: EM | Admit: 2020-12-25 | Discharge: 2020-12-25 | Disposition: A | Discharge status: Home / Self Care | Payer: Medicaid Other | Attending: Emergency Medicine | Admitting: Emergency Medicine

## 2020-12-25 DIAGNOSIS — R3989 Other symptoms and signs involving the genitourinary system: Secondary | ICD-10-CM | POA: Diagnosis present

## 2020-12-25 DIAGNOSIS — Z113 Encounter for screening for infections with a predominantly sexual mode of transmission: Secondary | ICD-10-CM | POA: Insufficient documentation

## 2020-12-25 MED ORDER — VALACYCLOVIR HCL 1 G PO TABS: 1000.0000 mg | ORAL_TABLET | Freq: Two times a day (BID) | ORAL | 0 refills | Status: AC

## 2020-12-25 NOTE — ED Provider Notes (Signed)
[facility]    CSN: [id number] Arrival date & time: 12/25/20  1128      History   Chief Complaint Chief Complaint  Patient presents with  . Rash    HPI Emily Callahan is a 25 y.o. female history of asthma presenting today for evaluation of a rash.  Reports over the past 2 days has had rash to genital area that is painful with wiping.  Denies any associated urinary symptoms of dysuria, increased frequency or urgency.  Denies any vaginal discharge itching or irritation.  Denies history of similar.  HPI  Past Medical History:  Diagnosis Date  . Asthma   . IBS (irritable bowel syndrome)   . Pregnant     Patient Active Problem List   Diagnosis Date Noted  . SVD (spontaneous vaginal delivery) 04/07/2017  . Normal labor 04/06/2017  . Nausea and vomiting in pregnancy 11/25/2016  . Supervision of normal pregnancy, antepartum 09/14/2016  . Asthma     Past Surgical History:  Procedure Laterality Date  . MOUTH SURGERY     Wisdom teeth "pulled"     OB History    Gravida  1   Para  1   Term  1   Preterm  0   AB  0   Living  1     SAB  0   IAB  0   Ectopic  0   Multiple  0   Live Births  1            Home Medications    Prior to Admission medications   Medication Sig Start Date End Date Taking? Authorizing Provider  valACYclovir (VALTREX) 1000 MG tablet Take 1 tablet (1,000 mg total) by mouth 2 (two) times daily for 7 days. 12/25/20 01/01/21 Yes Kiamesha Samet C, PA-C  acetaminophen (TYLENOL) 500 MG tablet Take 1,000 mg by mouth every 6 (six) hours as needed for mild pain, moderate pain, fever or headache.    [provider]  albuterol (VENTOLIN HFA) 108 (90 Base) MCG/ACT inhaler Inhale 2 puffs into the lungs every 4 (four) hours as needed for wheezing or shortness of breath.  12/14/19   [provider]  loratadine (CLARITIN) 10 MG tablet Take 1 tablet (10 mg total) by mouth daily. Patient not taking: Reported on 09/07/2018  10/28/16 12/25/20  Brock Bad, MD    Family History Family History  Problem Relation Age of Onset  . Hypertension Mother   . Cancer Mother   . Stroke Mother     Social History Social History   Tobacco Use  . Smoking status: Never Smoker  . Smokeless tobacco: Never Used  Substance Use Topics  . Alcohol use: No    Comment: no usage since + preg test   . Drug use: No    Types: Marijuana    Comment: No usage since + preg. test     Allergies   Patient has no known allergies.   Review of Systems Review of Systems  Constitutional: Negative for fatigue and fever.  HENT: Negative for mouth sores.   Eyes: Negative for visual disturbance.  Respiratory: Negative for shortness of breath.   Cardiovascular: Negative for chest pain.  Gastrointestinal: Negative for abdominal pain, diarrhea, nausea and vomiting.  Genitourinary: Positive for genital sores. Negative for dysuria, flank pain, hematuria, menstrual problem, vaginal bleeding, vaginal discharge and vaginal pain.  Musculoskeletal: Negative for arthralgias, back pain and joint swelling.  Skin: Positive for rash. Negative for color change  and wound.  Neurological: Negative for dizziness, weakness, light-headedness and headaches.     Physical Exam Triage Vital Signs ED Triage Vitals  Enc Vitals Group     BP      Pulse      Resp      Temp      Temp src      SpO2      Weight      Height      Head Circumference      Peak Flow      Pain Score      Pain Loc      Pain Edu?      Excl. in [place]?    No data found.  Updated Vital Signs BP 124/77 (BP Location: Left Arm)   Pulse 86   Temp 98.4 F (36.9 C) (Oral)   Resp 18   SpO2 98%   Visual Acuity Right Eye Distance:   Left Eye Distance:   Bilateral Distance:    Right Eye Near:   Left Eye Near:    Bilateral Near:     Physical Exam Vitals and nursing note reviewed.  Constitutional:      Appearance: She is well-developed.     Comments: No acute distress   HENT:     Head: Normocephalic and atraumatic.     Nose: Nose normal.  Eyes:     Conjunctiva/sclera: Conjunctivae normal.  Cardiovascular:     Rate and Rhythm: Normal rate.  Pulmonary:     Effort: Pulmonary effort is normal. No respiratory distress.  Abdominal:     General: There is no distension.  Genitourinary:    Comments: Normal external female genitalia, posterior portion of vulva/near perineum on the left side with 3 areas of erythema that appears slightly ulcerated  Vaginal mucosa pink, mucousy discharge noted at os Musculoskeletal:        General: Normal range of motion.     Cervical back: Neck supple.  Skin:    General: Skin is warm and dry.  Neurological:     Mental Status: She is alert and oriented to person, place, and time.      UC Treatments / Results  Labs (all labs ordered are listed, but only abnormal results are displayed) Labs Reviewed  HSV CULTURE AND TYPING  CERVICOVAGINAL ANCILLARY ONLY    EKG   Radiology No results found.  Procedures Procedures (including critical care time)  Medications Ordered in UC Medications - No data to display  Initial Impression / Assessment and Plan / UC Course  I have reviewed the triage vital signs and the nursing notes.  Pertinent labs & imaging results that were available during my care of the patient were reviewed by me and considered in my medical decision making (see chart for details).     Vaginal swab pending to screen for STDs, initiating on Valtrex for possible HSV as cause of sores, to appear ulcerated in nature versus other skin breakdown/tear.  Not as clearly defined as typical HSV lesions, but feel this is most likely cause.  We will call with results and provide further treatment as needed.  Discussed strict return precautions. Patient verbalized understanding and is agreeable with plan.  Final Clinical Impressions(s) / UC Diagnoses   Final diagnoses:  Genital sore  Screen for STD (sexually  transmitted disease)     Discharge Instructions     Vaginal swab and HSV swab pending for STD screening Begin Valtrex twice daily for the next week Tylenol  and ibuprofen as needed for pain We will call with results if abnormal and provide further medicine as needed    ED Prescriptions    Medication Sig Dispense Auth. Provider   valACYclovir (VALTREX) 1000 MG tablet Take 1 tablet (1,000 mg total) by mouth 2 (two) times daily for 7 days. 14 tablet Romelia Bromell, [place] C, PA-C     PDMP not reviewed this encounter.   Lew Dawes, PA-C 12/25/20 1204

## 2020-12-25 NOTE — ED Triage Notes (Signed)
Pt sts rash in genital area that is painful when wiping; pt denies dysuria

## 2020-12-25 NOTE — Discharge Instructions (Signed)
Vaginal swab and HSV swab pending for STD screening Begin Valtrex twice daily for the next week Tylenol and ibuprofen as needed for pain We will call with results if abnormal and provide further medicine as needed

## 2020-12-26 ENCOUNTER — Telehealth (HOSPITAL_COMMUNITY): Payer: Self-pay | Admitting: Emergency Medicine

## 2020-12-26 LAB — CERVICOVAGINAL ANCILLARY ONLY
Bacterial Vaginitis (gardnerella): POSITIVE — AB
Candida Glabrata: NEGATIVE
Candida Vaginitis: NEGATIVE
Chlamydia: POSITIVE — AB
Comment: NEGATIVE
Comment: NEGATIVE
Comment: NEGATIVE
Comment: NEGATIVE
Comment: NEGATIVE
Comment: NORMAL
Neisseria Gonorrhea: NEGATIVE
Trichomonas: NEGATIVE

## 2020-12-26 MED ORDER — METRONIDAZOLE 0.75 % VA GEL: 1.0000 | Freq: Every day | VAGINAL | 0 refills | Status: AC

## 2020-12-26 MED ORDER — DOXYCYCLINE HYCLATE 100 MG PO CAPS: 100.0000 mg | ORAL_CAPSULE | Freq: Two times a day (BID) | ORAL | 0 refills | Status: AC

## 2021-10-09 ENCOUNTER — Encounter (HOSPITAL_COMMUNITY): Payer: Self-pay

## 2021-10-09 ENCOUNTER — Ambulatory Visit (HOSPITAL_COMMUNITY)
Admission: EM | Admit: 2021-10-09 | Discharge: 2021-10-09 | Disposition: A | Discharge status: Home / Self Care | Payer: Medicaid Other | Attending: Family Medicine | Admitting: Family Medicine

## 2021-10-09 ENCOUNTER — Other Ambulatory Visit: Payer: Self-pay

## 2021-10-09 DIAGNOSIS — G43809 Other migraine, not intractable, without status migrainosus: Secondary | ICD-10-CM | POA: Insufficient documentation

## 2021-10-09 DIAGNOSIS — R109 Unspecified abdominal pain: Secondary | ICD-10-CM | POA: Insufficient documentation

## 2021-10-09 DIAGNOSIS — N390 Urinary tract infection, site not specified: Secondary | ICD-10-CM | POA: Diagnosis not present

## 2021-10-09 LAB — POCT URINALYSIS DIPSTICK, ED / UC
Bilirubin Urine: NEGATIVE
Glucose, UA: NEGATIVE mg/dL
Ketones, ur: NEGATIVE mg/dL
Nitrite: NEGATIVE
Protein, ur: NEGATIVE mg/dL
Specific Gravity, Urine: 1.015 (ref 1.005–1.030)
Urobilinogen, UA: 0.2 mg/dL (ref 0.0–1.0)
pH: 6.5 (ref 5.0–8.0)

## 2021-10-09 MED ORDER — ONDANSETRON 4 MG PO TBDP
ORAL_TABLET | ORAL | Status: AC
  Filled 2021-10-09: qty 1

## 2021-10-09 MED ORDER — KETOROLAC TROMETHAMINE 60 MG/2ML IM SOLN
60.0000 mg | Freq: Once | INTRAMUSCULAR | Status: AC
  Administered 2021-10-09: 60 mg via INTRAMUSCULAR

## 2021-10-09 MED ORDER — SULFAMETHOXAZOLE-TRIMETHOPRIM 800-160 MG PO TABS: 1.0000 | ORAL_TABLET | Freq: Two times a day (BID) | ORAL | 0 refills | Status: AC

## 2021-10-09 MED ORDER — KETOROLAC TROMETHAMINE 60 MG/2ML IM SOLN
INTRAMUSCULAR | Status: AC
  Filled 2021-10-09: qty 2

## 2021-10-09 MED ORDER — ONDANSETRON 4 MG PO TBDP
4.0000 mg | ORAL_TABLET | Freq: Once | ORAL | Status: AC
  Administered 2021-10-09: 4 mg via ORAL

## 2021-10-09 MED ORDER — RIZATRIPTAN BENZOATE 10 MG PO TBDP: 10.0000 mg | ORAL_TABLET | ORAL | 0 refills | Status: DC | PRN

## 2021-10-09 NOTE — ED Triage Notes (Signed)
Pt presents with c/o migraine x 2 days.   States she started stomach pain after she got back from her cruise last Thursday.  Denies N/V.

## 2021-10-09 NOTE — ED Provider Notes (Signed)
[facility]    CSN: [id number] Arrival date & time: 10/09/21  0901      History   Chief Complaint Chief Complaint  Patient presents with   Migraine   Abdominal Pain    HPI Emily Callahan is a 26 y.o. female.   Patient is here for several issues.  She has a h/o migraines, and having migraines off/on the last 2 weeks.  She has had her current migraine since last night, unable to sleep.  She did take tylenol, without much help.  She does not have rx meds at home for migraines. This is typical for her migraines, but could not sleep with this one.   Also with abd pain, low abdomen.  Pain/cramping at times.  No n/v.  + ibs, but  bms have been normal with that.  No urinary symptoms.  No pain, burning.  ?+ frequency.  No fevers/chills.  No vaginal d/c.   Past Medical History:  Diagnosis Date   Asthma    IBS (irritable bowel syndrome)    Pregnant     Patient Active Problem List   Diagnosis Date Noted   SVD (spontaneous vaginal delivery) 04/07/2017   Normal labor 04/06/2017   Nausea and vomiting in pregnancy 11/25/2016   Supervision of normal pregnancy, antepartum 09/14/2016   Asthma     Past Surgical History:  Procedure Laterality Date   MOUTH SURGERY     Wisdom teeth "pulled"     OB History     Gravida  1   Para  1   Term  1   Preterm  0   AB  0   Living  1      SAB  0   IAB  0   Ectopic  0   Multiple  0   Live Births  1            Home Medications    Prior to Admission medications   Medication Sig Start Date End Date Taking? Authorizing Provider  acetaminophen (TYLENOL) 500 MG tablet Take 1,000 mg by mouth every 6 (six) hours as needed for mild pain, moderate pain, fever or headache.    [provider]  albuterol (VENTOLIN HFA) 108 (90 Base) MCG/ACT inhaler Inhale 2 puffs into the lungs every 4 (four) hours as needed for wheezing or shortness of breath.  12/14/19   [provider]  loratadine (CLARITIN) 10 MG  tablet Take 1 tablet (10 mg total) by mouth daily. Patient not taking: Reported on 09/07/2018 10/28/16 12/25/20  Shelly Bombard, MD    Family History Family History  Problem Relation Age of Onset   Hypertension Mother    Cancer Mother    Stroke Mother     Social History Social History   Tobacco Use   Smoking status: Never   Smokeless tobacco: Never  Substance Use Topics   Alcohol use: No    Comment: no usage since + preg test    Drug use: No    Types: Marijuana    Comment: No usage since + preg. test     Allergies   Patient has no known allergies.   Review of Systems Review of Systems  Gastrointestinal:  Positive for abdominal pain.    Physical Exam Triage Vital Signs ED Triage Vitals  Enc Vitals Group     BP 10/09/21 0922 115/75     Pulse Rate 10/09/21 0922 80     Resp 10/09/21 0922 17  Temp 10/09/21 0922 98.9 F (37.2 C)     Temp Source 10/09/21 0922 Oral     SpO2 10/09/21 0922 96 %     Weight --      Height --      Head Circumference --      Peak Flow --      Pain Score 10/09/21 0921 4     Pain Loc --      Pain Edu? --      Excl. in [place]? --    No data found.  Updated Vital Signs BP 115/75 (BP Location: Right Arm)    Pulse 80    Temp 98.9 F (37.2 C) (Oral)    Resp 17    LMP 10/02/2021 (Exact Date)    SpO2 96%   Visual Acuity Right Eye Distance:   Left Eye Distance:   Bilateral Distance:    Right Eye Near:   Left Eye Near:    Bilateral Near:     Physical Exam   UC Treatments / Results  Labs (all labs ordered are listed, but only abnormal results are displayed) Labs Reviewed  POCT URINALYSIS DIPSTICK, ED / UC - Abnormal; Notable for the following components:      Result Value   Hgb urine dipstick TRACE (*)    Leukocytes,Ua SMALL (*)    All other components within normal limits  URINE CULTURE    EKG   Radiology No results found.  Procedures Procedures (including critical care time)  Medications Ordered in UC Medications  - No data to display  Initial Impression / Assessment and Plan / UC Course  I have reviewed the triage vital signs and the nursing notes.  Pertinent labs & imaging results that were available during my care of the patient were reviewed by me and considered in my medical decision making (see chart for details).   Patient was seen today for migraine headache.  She does not have abortive medication at home despite a history of migraines.  This is typical for her migraines.  I have given her zofran and toradol here.  Rx for maxalt to the pharmacy.  Discussed using this at  the start of a migraine headache.  I also recommend excedrin migraine otc if needed.  She was also here for low abdominal pain.  UA did show blood and leuks.  Will treat for UTI today, and will culture the urine.  Will follow up if not improving as expected.   Final Clinical Impressions(s) / UC Diagnoses   Final diagnoses:  Other migraine without status migrainosus, not intractable  Abdominal pain, unspecified abdominal location  Urinary tract infection without hematuria, site unspecified     Discharge Instructions      You were seen today for migraine headache, as well as abdominal pain.  You were given toradol and and zofran today.  I have sent out maxalt for your migraine headache.  You may take 1, and then repeat in 2 hrs if needed.  No more within a 24hr period.  If you continue with severe pain you may need to be seen in the ER.  I have sent out an antibiotic for possible urinary tract infection.  We will culture your urine and that will be resulted in 24-48 hrs.     ED Prescriptions     Medication Sig Dispense Auth. Provider   rizatriptan (MAXALT-MLT) 10 MG disintegrating tablet Take 1 tablet (10 mg total) by mouth as needed for migraine. May repeat in  2 hours if needed 10 tablet Charleston Vierling, MD   sulfamethoxazole-trimethoprim (BACTRIM DS) 800-160 MG tablet Take 1 tablet by mouth 2 (two) times daily for 7 days.  14 tablet Rondel Oh, MD      PDMP not reviewed this encounter.   Rondel Oh, MD 10/09/21 1017

## 2021-10-09 NOTE — Discharge Instructions (Signed)
You were seen today for migraine headache, as well as abdominal pain.  You were given toradol and and zofran today.  I have sent out maxalt for your migraine headache.  You may take 1, and then repeat in 2 hrs if needed.  No more within a 24hr period.  If you continue with severe pain you may need to be seen in the ER.  I have sent out an antibiotic for possible urinary tract infection.  We will culture your urine and that will be resulted in 24-48 hrs.

## 2021-10-10 LAB — URINE CULTURE: Culture: <10000 — AB

## 2021-12-30 ENCOUNTER — Emergency Department (HOSPITAL_COMMUNITY): Payer: Medicaid Other

## 2021-12-30 ENCOUNTER — Encounter (HOSPITAL_COMMUNITY): Payer: Self-pay

## 2021-12-30 ENCOUNTER — Other Ambulatory Visit: Payer: Self-pay

## 2021-12-30 ENCOUNTER — Emergency Department (HOSPITAL_COMMUNITY)
Admission: EM | Admit: 2021-12-30 | Discharge: 2021-12-30 | Disposition: A | Discharge status: Home / Self Care | Payer: Medicaid Other | Attending: Emergency Medicine | Admitting: Emergency Medicine

## 2021-12-30 DIAGNOSIS — R1084 Generalized abdominal pain: Secondary | ICD-10-CM | POA: Insufficient documentation

## 2021-12-30 DIAGNOSIS — R059 Cough, unspecified: Secondary | ICD-10-CM | POA: Diagnosis not present

## 2021-12-30 DIAGNOSIS — R1032 Left lower quadrant pain: Secondary | ICD-10-CM | POA: Diagnosis present

## 2021-12-30 DIAGNOSIS — R112 Nausea with vomiting, unspecified: Secondary | ICD-10-CM | POA: Insufficient documentation

## 2021-12-30 HISTORY — DX: Migraine, unspecified, not intractable, without status migrainosus: G43.909

## 2021-12-30 LAB — CBC
HCT: 37.3 % (ref 36.0–46.0)
Hemoglobin: 11.3 g/dL — ABNORMAL LOW (ref 12.0–15.0)
MCH: 25.1 pg — ABNORMAL LOW (ref 26.0–34.0)
MCHC: 30.3 g/dL (ref 30.0–36.0)
MCV: 82.9 fL (ref 80.0–100.0)
Platelets: 224 10*3/uL (ref 150–400)
RBC: 4.5 MIL/uL (ref 3.87–5.11)
RDW: 16.5 % — ABNORMAL HIGH (ref 11.5–15.5)
WBC: 9.1 10*3/uL (ref 4.0–10.5)
nRBC: 0 % (ref 0.0–0.2)

## 2021-12-30 LAB — URINALYSIS, ROUTINE W REFLEX MICROSCOPIC
Bilirubin Urine: NEGATIVE
Glucose, UA: NEGATIVE mg/dL
Ketones, ur: NEGATIVE mg/dL
Nitrite: NEGATIVE
Protein, ur: NEGATIVE mg/dL
Specific Gravity, Urine: 1.011 (ref 1.005–1.030)
pH: 6 (ref 5.0–8.0)

## 2021-12-30 LAB — COMPREHENSIVE METABOLIC PANEL
ALT: 17 U/L (ref 0–44)
AST: 21 U/L (ref 15–41)
Albumin: 3.6 g/dL (ref 3.5–5.0)
Alkaline Phosphatase: 39 U/L (ref 38–126)
Anion gap: 5 (ref 5–15)
BUN: 6 mg/dL (ref 6–20)
CO2: 27 mmol/L (ref 22–32)
Calcium: 8.6 mg/dL — ABNORMAL LOW (ref 8.9–10.3)
Chloride: 104 mmol/L (ref 98–111)
Creatinine, Ser: 0.7 mg/dL (ref 0.44–1.00)
GFR, Estimated: >60 mL/min (ref >60)
Glucose, Bld: 96 mg/dL (ref 70–99)
Potassium: 3.8 mmol/L (ref 3.5–5.1)
Sodium: 136 mmol/L (ref 135–145)
Total Bilirubin: 0.5 mg/dL (ref 0.3–1.2)
Total Protein: 7.4 g/dL (ref 6.5–8.1)

## 2021-12-30 LAB — LIPASE, BLOOD: Lipase: 33 U/L (ref 11–51)

## 2021-12-30 LAB — I-STAT BETA HCG BLOOD, ED (MC, WL, AP ONLY): I-stat hCG, quantitative: <5 m[IU]/mL (ref <5)

## 2021-12-30 LAB — TROPONIN I (HIGH SENSITIVITY): Troponin I (High Sensitivity): <2 ng/L (ref <18)

## 2021-12-30 MED ORDER — KETOROLAC TROMETHAMINE 15 MG/ML IJ SOLN
15.0000 mg | Freq: Once | INTRAMUSCULAR | Status: AC
  Administered 2021-12-30: 15 mg via INTRAVENOUS
  Filled 2021-12-30: qty 1

## 2021-12-30 MED ORDER — IOHEXOL 300 MG/ML  SOLN
100.0000 mL | Freq: Once | INTRAMUSCULAR | Status: AC | PRN
  Administered 2021-12-30: 100 mL via INTRAVENOUS

## 2021-12-30 NOTE — ED Provider Triage Note (Signed)
Emergency Medicine Provider Triage Evaluation Note ? ?Emily Callahan , a 26 y.o. female  was evaluated in triage.  Pt complains of [place] abd pain LUQ and lower abd pain. ?Sx for 2 weeks.  ?She went to an UC previously.  ?Coughing - no hemoptysis  ? ?LUQ abd pain is more under breast/ribcage  ? ?No CP SOB ? ?No recent surgeries, hospitalization, long travel, hemoptysis, estrogen containing OCP, cancer history.  No unilateral leg swelling.  No history of PE or VTE.  ? ? ?Review of Systems  ?Positive: Abd pain, cough ?Negative: Fever  ? ?Physical Exam  ?BP 117/78 (BP Location: Left Arm)   Pulse 87   Temp 98.4 ?F (36.9 ?C) (Oral)   Resp 16   SpO2 100%  ?Gen:   Awake, no distress   ?Resp:  Normal effort  ?MSK:   Moves extremities without difficulty  ?Other:  NTTP abd. No leg edema ? ?Medical Decision Making  ?Medically screening exam initiated at 9:55 AM.  Appropriate orders placed.  Cantrice Stults was informed that the remainder of the evaluation will be completed by another provider, this initial triage assessment does not replace that evaluation, and the importance of remaining in the ED until their evaluation is complete. ? ?labs ?  ?Tedd Sias, [place] ?12/30/21 1000 ? ?

## 2021-12-30 NOTE — ED Triage Notes (Signed)
Patient c/o LUQ and lower abdominal pain x 2 weeks. Patient states she vomited x 1 yesterday only. ? ?Patient reports a history of IBS. ?

## 2021-12-30 NOTE — Discharge Instructions (Signed)
Follow-up with your primary care doctor regarding your symptoms from today.  Come back to ER if you develop worsening abdominal pain nausea or vomiting. ?

## 2021-12-30 NOTE — ED Provider Notes (Signed)
?[facility]-EMERGENCY DEPT ?Provider Note ? ? ?CSN: [id number] ?Arrival date & time: 12/30/21  [id number] ? ?  ? ?History ? ?Chief Complaint  ?Patient presents with  ? Abdominal Pain  ? ? ?Emily Callahan is a 26 y.o. female.  Presents to ER due to concern for abdominal pain.  Pain has been going on for about 2 weeks.  Primarily left-sided.  Had some nausea but only 1 episode of vomiting, this occurred yesterday.  Today patient is moderate, left-sided but also having pain in both of her lower and middle abdomen.  She has had a little bit of a cough but no difficulty breathing and she denies any chest pain. No recent surgeries, hospitalization, long travel, hemoptysis, estrogen containing OCP, cancer history.  No unilateral leg swelling.  No history of PE or VTE.  ? ?HPI ? ?  ? ?Home Medications ?Prior to Admission medications   ?Medication Sig Start Date End Date Taking? Authorizing Provider  ?acetaminophen (TYLENOL) 500 MG tablet Take 1,000 mg by mouth every 6 (six) hours as needed for mild pain, moderate pain, fever or headache.    [provider]  ?albuterol (VENTOLIN HFA) 108 (90 Base) MCG/ACT inhaler Inhale 2 puffs into the lungs every 4 (four) hours as needed for wheezing or shortness of breath.  12/14/19   [provider]  ?rizatriptan (MAXALT-MLT) 10 MG disintegrating tablet Take 1 tablet (10 mg total) by mouth as needed for migraine. May repeat in 2 hours if needed 10/09/21   Jannifer FranklinPiontek, Erin, MD  ?loratadine (CLARITIN) 10 MG tablet Take 1 tablet (10 mg total) by mouth daily. ?Patient not taking: Reported on 09/07/2018 10/28/16 12/25/20  Brock BadHarper, Charles A, MD  ?   ? ?Allergies    ?Patient has no known allergies.   ? ?Review of Systems   ?Review of Systems  ?Constitutional:  Negative for chills and fever.  ?HENT:  Negative for ear pain and sore throat.   ?Eyes:  Negative for pain and visual disturbance.  ?Respiratory:  Positive for cough. Negative for shortness of breath.    ?Cardiovascular:  Negative for chest pain and palpitations.  ?Gastrointestinal:  Positive for abdominal pain. Negative for vomiting.  ?Genitourinary:  Negative for dysuria and hematuria.  ?Musculoskeletal:  Negative for arthralgias and back pain.  ?Skin:  Negative for color change and rash.  ?Neurological:  Negative for seizures and syncope.  ?All other systems reviewed and are negative. ? ?Physical Exam ?Updated Vital Signs ?BP 127/88   Pulse 66   Temp 97.6 ?F (36.4 ?C) (Oral)   Resp 12   Ht 5\' 4"  (1.626 m)   Wt 67.1 kg   LMP 12/02/2021   SpO2 100%   BMI 25.40 kg/m?  ?Physical Exam ?Vitals and nursing note reviewed.  ?Constitutional:   ?   General: She is not in acute distress. ?   Appearance: She is well-developed.  ?HENT:  ?   Head: Normocephalic and atraumatic.  ?Eyes:  ?   Conjunctiva/sclera: Conjunctivae normal.  ?Cardiovascular:  ?   Rate and Rhythm: Normal rate and regular rhythm.  ?   Heart sounds: No murmur heard. ?Pulmonary:  ?   Effort: Pulmonary effort is normal. No respiratory distress.  ?   Breath sounds: Normal breath sounds.  ?Abdominal:  ?   Palpations: Abdomen is soft.  ?   Tenderness: There is no abdominal tenderness.  ?   Comments: Some tenderness in the left upper quadrant, left lower quadrant, right lower quadrant, no rebound  or guarding  ?Musculoskeletal:     ?   General: No swelling.  ?   Cervical back: Neck supple.  ?Skin: ?   General: Skin is warm and dry.  ?   Capillary Refill: Capillary refill takes less than 2 seconds.  ?Neurological:  ?   Mental Status: She is alert.  ?Psychiatric:     ?   Mood and Affect: Mood normal.  ? ? ?ED Results / Procedures / Treatments   ?Labs ?(all labs ordered are listed, but only abnormal results are displayed) ?Labs Reviewed  ?COMPREHENSIVE METABOLIC PANEL - Abnormal; Notable for the following components:  ?    Result Value  ? Calcium 8.6 (*)   ? All other components within normal limits  ?CBC - Abnormal; Notable for the following components:  ?  Hemoglobin 11.3 (*)   ? MCH 25.1 (*)   ? RDW 16.5 (*)   ? All other components within normal limits  ?URINALYSIS, ROUTINE W REFLEX MICROSCOPIC - Abnormal; Notable for the following components:  ? APPearance HAZY (*)   ? Hgb urine dipstick SMALL (*)   ? Leukocytes,Ua SMALL (*)   ? Bacteria, UA RARE (*)   ? All other components within normal limits  ?LIPASE, BLOOD  ?I-STAT BETA HCG BLOOD, ED ([facility], [facility], [facility] ONLY)  ?TROPONIN I (HIGH SENSITIVITY)  ? ? ?EKG ?EKG Interpretation ? ?Date/Time:  Tuesday December 30 2021 10:10:07 EDT ?Ventricular Rate:  82 ?PR Interval:  163 ?QRS Duration: 91 ?QT Interval:  402 ?QTC Calculation: 470 ?R Axis:   88 ?Text Interpretation: Sinus rhythm Borderline ST elevation, lateral leads Confirmed by Alona Bene [phone]) on 12/30/2021 10:16:03 AM ? ?Radiology ?DG Chest 2 View ? ?Result Date: 12/30/2021 ?CLINICAL DATA:  CP, cough EXAM: CHEST - 2 VIEW COMPARISON:  June 24, 2014. FINDINGS: No consolidation. No visible pleural effusions or pneumothorax. Cardiomediastinal silhouette is within normal limits. No displaced fracture. IMPRESSION: No evidence of acute cardiopulmonary disease. Electronically Signed   By: Feliberto Harts M.D.   On: 12/30/2021 10:32  ? ?CT ABDOMEN PELVIS W CONTRAST ? ?Result Date: 12/30/2021 ?CLINICAL DATA:  Left upper quadrant and left lower quadrant abdominal pain for 2 weeks. History of IBS. EXAM: CT ABDOMEN AND PELVIS WITH CONTRAST TECHNIQUE: Multidetector CT imaging of the abdomen and pelvis was performed using the standard protocol following bolus administration of intravenous contrast. RADIATION DOSE REDUCTION: This exam was performed according to the departmental dose-optimization program which includes automated exposure control, adjustment of the mA and/or kV according to patient size and/or use of iterative reconstruction technique. CONTRAST:  OMNIPAQUE IOHEXOL 300 MG/ML  SOLN COMPARISON:  CT examination dated May 08, 2020 FINDINGS: Lower chest: No acute  abnormality. Hepatobiliary: No focal liver abnormality is seen. Liver is mildly enlarged measuring 19.2 cm in craniocaudal dimension. No gallstones, gallbladder wall thickening, or biliary dilatation. Pancreas: Unremarkable. No pancreatic ductal dilatation or surrounding inflammatory changes. Spleen: Normal in size without focal abnormality. Adrenals/Urinary Tract: Adrenal glands are unremarkable. Kidneys are normal, without renal calculi, focal lesion, or hydronephrosis. Bladder is unremarkable. Stomach/Bowel: Stomach is within normal limits. Appendix appears normal. No evidence of bowel wall thickening, distention, or inflammatory changes. Vascular/Lymphatic: No significant vascular findings are present. No enlarged abdominal or pelvic lymph nodes. Reproductive: Uterus and bilateral adnexa are unremarkable. Other: No abdominal wall hernia or abnormality. No abdominopelvic ascites. Musculoskeletal: No acute or significant osseous findings. IMPRESSION: 1. Bowel loops are normal in caliber without evidence of significant bowel wall thickening or inflammatory  changes. Normal appendix. 2.  No evidence of nephrolithiasis or hydronephrosis. 3.  Uterus and bilateral adnexa unremarkable. 4.  No CT evidence of acute abdominal/pelvic process. Electronically Signed   By: Larose Hires D.O.   On: 12/30/2021 12:48   ? ?Procedures ?Procedures  ? ? ?Medications Ordered in ED ?Medications  ?ketorolac (TORADOL) 15 MG/ML injection 15 mg (15 mg Intravenous Given 12/30/21 1240)  ?iohexol (OMNIPAQUE) 300 MG/ML solution 100 mL (100 mLs Intravenous Contrast Given 12/30/21 1220)  ? ? ?ED Course/ Medical Decision Making/ A&P ?  ?                        ?Medical Decision Making ?Amount and/or Complexity of Data Reviewed ?Radiology: ordered. ? ?Risk ?Prescription drug management. ? ? ?26 year old lady presenting to ER due to concern for abdominal pain.  On exam she was well-appearing in no distress, did note some generalized tenderness to  palpation but there is no rebound or guarding.  Her basic lab work is grossly stable.  No leukocytosis, no anemia, no electrolyte derangement or change in renal function.  CT scan was checked to rule out acute appendicitis, diverticu

## 2022-02-06 ENCOUNTER — Other Ambulatory Visit: Payer: Self-pay

## 2022-02-06 ENCOUNTER — Emergency Department (HOSPITAL_COMMUNITY)
Admission: EM | Admit: 2022-02-06 | Discharge: 2022-02-06 | Disposition: A | Discharge status: Home / Self Care | Payer: Medicaid Other | Attending: Emergency Medicine | Admitting: Emergency Medicine

## 2022-02-06 ENCOUNTER — Encounter (HOSPITAL_COMMUNITY): Payer: Self-pay

## 2022-02-06 DIAGNOSIS — Z20822 Contact with and (suspected) exposure to covid-19: Secondary | ICD-10-CM | POA: Diagnosis not present

## 2022-02-06 DIAGNOSIS — R519 Headache, unspecified: Secondary | ICD-10-CM | POA: Insufficient documentation

## 2022-02-06 DIAGNOSIS — G43809 Other migraine, not intractable, without status migrainosus: Secondary | ICD-10-CM

## 2022-02-06 DIAGNOSIS — J029 Acute pharyngitis, unspecified: Secondary | ICD-10-CM | POA: Diagnosis not present

## 2022-02-06 LAB — RESP PANEL BY RT-PCR (FLU A&B, COVID) ARPGX2
Influenza A by PCR: NEGATIVE
Influenza B by PCR: NEGATIVE
SARS Coronavirus 2 by RT PCR: NEGATIVE

## 2022-02-06 LAB — GROUP A STREP BY PCR: Group A Strep by PCR: NOT DETECTED

## 2022-02-06 MED ORDER — KETOROLAC TROMETHAMINE 60 MG/2ML IM SOLN
30.0000 mg | Freq: Once | INTRAMUSCULAR | Status: AC
  Administered 2022-02-06: 30 mg via INTRAMUSCULAR
  Filled 2022-02-06: qty 2

## 2022-02-06 MED ORDER — ACETAMINOPHEN 500 MG PO TABS
1000.0000 mg | ORAL_TABLET | Freq: Once | ORAL | Status: AC
  Administered 2022-02-06: 1000 mg via ORAL
  Filled 2022-02-06: qty 2

## 2022-02-06 MED ORDER — RIZATRIPTAN BENZOATE 10 MG PO TBDP: 10.0000 mg | ORAL_TABLET | ORAL | 0 refills | Status: DC | PRN

## 2022-02-06 NOTE — Discharge Instructions (Addendum)
You have been seen and discharged from the emergency department.  Your flu, COVID and strep swab were negative.  Take prescription for migraine headache control.  Stay well-hydrated.  Take over-the-counter medication for symptom control.  Follow-up with your primary provider for further evaluation and further care. Take home medications as prescribed. If you have any worsening symptoms or further concerns for your health please return to an emergency department for further evaluation.

## 2022-02-06 NOTE — ED Provider Notes (Signed)
[facility] DEPT Provider Note   CSN: [id number] Arrival date & time: 02/06/22  [id number]     History  Chief Complaint  Patient presents with   Sore Throat   Migraine    Emily Callahan is a 26 y.o. female.  HPI  26 year old female presents emergency department with headache and sore throat.  History of migraines.  States that she developed headache starting last night, very characteristic of her previous migraines.  Gradual in onset.  Her son developed a sore throat last night.  She woke up this morning with a generalized sore throat.  No fever.  She has history of IBS and chronically has diarrhea but denies any nausea/vomiting or bloody bowel movements.  No active abdominal pain.  No cough.  Home Medications Prior to Admission medications   Medication Sig Start Date End Date Taking? Authorizing Provider  acetaminophen (TYLENOL) 500 MG tablet Take 1,000 mg by mouth every 6 (six) hours as needed for mild pain, moderate pain, fever or headache.    [provider]  albuterol (VENTOLIN HFA) 108 (90 Base) MCG/ACT inhaler Inhale 2 puffs into the lungs every 4 (four) hours as needed for wheezing or shortness of breath.  12/14/19   [provider]  rizatriptan (MAXALT-MLT) 10 MG disintegrating tablet Take 1 tablet (10 mg total) by mouth as needed for migraine. May repeat in 2 hours if needed 10/09/21   Rondel Oh, MD  loratadine (CLARITIN) 10 MG tablet Take 1 tablet (10 mg total) by mouth daily. Patient not taking: Reported on 09/07/2018 10/28/16 12/25/20  Shelly Bombard, MD      Allergies    Patient has no known allergies.    Review of Systems   Review of Systems  Constitutional:  Negative for fever.  HENT:  Positive for sore throat. Negative for ear pain.   Respiratory:  Negative for cough.   Cardiovascular:  Negative for chest pain.  Gastrointestinal:  Negative for abdominal pain, nausea and vomiting.  Neurological:  Positive for headaches.    Physical Exam Updated Vital Signs BP 120/84 (BP Location: Left Arm)   Pulse 98   Temp 97.7 F (36.5 C) (Oral)   Resp 17   Ht 5\' 4"  (1.626 m)   Wt 64.4 kg   LMP 02/05/2022   SpO2 98%   BMI 24.37 kg/m  Physical Exam Vitals and nursing note reviewed.  Constitutional:      General: She is not in acute distress.    Appearance: Normal appearance.  HENT:     Head: Normocephalic.     Mouth/Throat:     Mouth: Mucous membranes are moist.     Pharynx: Posterior oropharyngeal erythema present. No oropharyngeal exudate.  Cardiovascular:     Rate and Rhythm: Normal rate.  Pulmonary:     Effort: Pulmonary effort is normal. No respiratory distress.     Breath sounds: No rales.  Abdominal:     Palpations: Abdomen is soft.     Tenderness: There is no abdominal tenderness.  Skin:    General: Skin is warm.  Neurological:     Mental Status: She is alert and oriented to person, place, and time. Mental status is at baseline.  Psychiatric:        Mood and Affect: Mood normal.    ED Results / Procedures / Treatments   Labs (all labs ordered are listed, but only abnormal results are displayed) Labs Reviewed  GROUP A STREP BY PCR  RESP PANEL BY RT-PCR (  FLU A&B, COVID) ARPGX2    EKG None  Radiology No results found.  Procedures Procedures    Medications Ordered in ED Medications  acetaminophen (TYLENOL) tablet 1,000 mg (1,000 mg Oral Given 02/06/22 1037)    ED Course/ Medical Decision Making/ A&P                           Medical Decision Making Risk OTC drugs. Prescription drug management.   26 year old female presents emergency department with migraine and sore throat.  Her son is currently sick with sore throat as well.  Vitals are stable on arrival.  This migraine is very typical of her previous migraines.  She ran out of her Maxalt that she usually takes for, follow-up appointment as an outpatient is not for a couple weeks.  Patient states she could not be pregnant  currently.  Flu/COVID/strep swab is negative.  Mild pharyngitis on exam.  Most likely viral.  Headache improved with medications.  Plan for symptomatic outpatient treatment and follow-up.  Patient at this time appears safe and stable for discharge and close outpatient follow up. Discharge plan and strict return to ED precautions discussed, patient verbalizes understanding and agreement.        Final Clinical Impression(s) / ED Diagnoses Final diagnoses:  None    Rx / DC Orders ED Discharge Orders     None         Lorelle Gibbs, DO 02/06/22 1231

## 2022-02-06 NOTE — ED Triage Notes (Signed)
Patient reports intermittent migraine x 1 weeks. Patient reports sensitivity to light. Patient reports a history of the same. Patient also c/o sore throat since waking this AM.

## 2022-05-27 ENCOUNTER — Emergency Department (HOSPITAL_COMMUNITY): Payer: Medicaid Other

## 2022-05-27 ENCOUNTER — Encounter (HOSPITAL_COMMUNITY): Payer: Self-pay

## 2022-05-27 ENCOUNTER — Emergency Department (HOSPITAL_COMMUNITY)
Admission: EM | Admit: 2022-05-27 | Discharge: 2022-05-27 | Disposition: A | Discharge status: Home / Self Care | Payer: Medicaid Other | Attending: Emergency Medicine | Admitting: Emergency Medicine

## 2022-05-27 DIAGNOSIS — Z20822 Contact with and (suspected) exposure to covid-19: Secondary | ICD-10-CM | POA: Diagnosis not present

## 2022-05-27 DIAGNOSIS — N9489 Other specified conditions associated with female genital organs and menstrual cycle: Secondary | ICD-10-CM | POA: Insufficient documentation

## 2022-05-27 DIAGNOSIS — B349 Viral infection, unspecified: Secondary | ICD-10-CM | POA: Insufficient documentation

## 2022-05-27 DIAGNOSIS — R519 Headache, unspecified: Secondary | ICD-10-CM | POA: Diagnosis present

## 2022-05-27 LAB — CBC WITH DIFFERENTIAL/PLATELET
Abs Immature Granulocytes: 0.01 10*3/uL (ref 0.00–0.07)
Basophils Absolute: 0 10*3/uL (ref 0.0–0.1)
Basophils Relative: 0 %
Eosinophils Absolute: 0.1 10*3/uL (ref 0.0–0.5)
Eosinophils Relative: 1 %
HCT: 37.9 % (ref 36.0–46.0)
Hemoglobin: 11.7 g/dL — ABNORMAL LOW (ref 12.0–15.0)
Immature Granulocytes: 0 %
Lymphocytes Relative: 30 %
Lymphs Abs: 2.1 10*3/uL (ref 0.7–4.0)
MCH: 25.6 pg — ABNORMAL LOW (ref 26.0–34.0)
MCHC: 30.9 g/dL (ref 30.0–36.0)
MCV: 82.9 fL (ref 80.0–100.0)
Monocytes Absolute: 0.7 10*3/uL (ref 0.1–1.0)
Monocytes Relative: 10 %
Neutro Abs: 4.2 10*3/uL (ref 1.7–7.7)
Neutrophils Relative %: 59 %
Platelets: 231 10*3/uL (ref 150–400)
RBC: 4.57 MIL/uL (ref 3.87–5.11)
RDW: 16 % — ABNORMAL HIGH (ref 11.5–15.5)
WBC: 7.1 10*3/uL (ref 4.0–10.5)
nRBC: 0 % (ref 0.0–0.2)

## 2022-05-27 LAB — URINALYSIS, ROUTINE W REFLEX MICROSCOPIC
Bilirubin Urine: NEGATIVE
Glucose, UA: NEGATIVE mg/dL
Hgb urine dipstick: NEGATIVE
Ketones, ur: NEGATIVE mg/dL
Nitrite: NEGATIVE
Protein, ur: NEGATIVE mg/dL
Specific Gravity, Urine: 1.018 (ref 1.005–1.030)
pH: 6 (ref 5.0–8.0)

## 2022-05-27 LAB — COMPREHENSIVE METABOLIC PANEL
ALT: 29 U/L (ref 0–44)
AST: 23 U/L (ref 15–41)
Albumin: 3.8 g/dL (ref 3.5–5.0)
Alkaline Phosphatase: 38 U/L (ref 38–126)
Anion gap: 6 (ref 5–15)
BUN: 8 mg/dL (ref 6–20)
CO2: 25 mmol/L (ref 22–32)
Calcium: 9.6 mg/dL (ref 8.9–10.3)
Chloride: 112 mmol/L — ABNORMAL HIGH (ref 98–111)
Creatinine, Ser: 0.67 mg/dL (ref 0.44–1.00)
GFR, Estimated: >60 mL/min (ref >60)
Glucose, Bld: 103 mg/dL — ABNORMAL HIGH (ref 70–99)
Potassium: 3.7 mmol/L (ref 3.5–5.1)
Sodium: 143 mmol/L (ref 135–145)
Total Bilirubin: 0.5 mg/dL (ref 0.3–1.2)
Total Protein: 7.8 g/dL (ref 6.5–8.1)

## 2022-05-27 LAB — RESP PANEL BY RT-PCR (FLU A&B, COVID) ARPGX2
Influenza A by PCR: NEGATIVE
Influenza B by PCR: NEGATIVE
SARS Coronavirus 2 by RT PCR: NEGATIVE

## 2022-05-27 LAB — LIPASE, BLOOD: Lipase: 58 U/L — ABNORMAL HIGH (ref 11–51)

## 2022-05-27 LAB — I-STAT BETA HCG BLOOD, ED (MC, WL, AP ONLY): I-stat hCG, quantitative: <5 m[IU]/mL (ref <5)

## 2022-05-27 MED ORDER — ONDANSETRON 4 MG PO TBDP: 4.0000 mg | ORAL_TABLET | Freq: Two times a day (BID) | ORAL | 0 refills | Status: DC | PRN

## 2022-05-27 MED ORDER — ACETAMINOPHEN 325 MG PO TABS
650.0000 mg | ORAL_TABLET | Freq: Once | ORAL | Status: AC
  Administered 2022-05-27: 650 mg via ORAL
  Filled 2022-05-27: qty 2

## 2022-05-27 MED ORDER — SODIUM CHLORIDE 0.9 % IV BOLUS
1000.0000 mL | Freq: Once | INTRAVENOUS | Status: AC
  Administered 2022-05-27: 1000 mL via INTRAVENOUS

## 2022-05-27 NOTE — Discharge Instructions (Addendum)
At this time there does not appear to be the presence of an emergent medical condition, however there is always the potential for conditions to change. Please read and follow the below instructions.  Please return to the Emergency Department immediately for any new or worsening symptoms or if your symptoms do not improve within 3 days. Please be sure to follow up with your Primary Care Provider within one week regarding your visit today; please call their office to schedule an appointment even if you are feeling better for a follow-up visit. Please be sure to drink plenty of water and get plenty of rest.  You may continue using over-the-counter Tylenol as directed on packaging to help with your symptoms.   Please read the additional information packets attached to your discharge summary.  Go to the nearest Emergency Department immediately if: You have fever or chills You have shortness of breath that gets worse. You have very bad or constant: Headache. Ear pain. Pain in your forehead, behind your eyes, and over your cheekbones (sinus pain). Chest pain. You have long-lasting (chronic) lung disease along with any of these: Making high-pitched whistling sounds when you breathe, most often when you breathe out (wheezing). Long-lasting cough (more than 14 days). Coughing up blood. A change in your usual mucus. You have a stiff neck. You have changes in your: Vision. Hearing. Thinking. Mood.         You have any new/concerning or worsening of symptoms  Do not take your medicine if  develop an itchy rash, swelling in your mouth or lips, or difficulty breathing; call 911 and seek immediate emergency medical attention if this occurs.  You may review your lab tests and imaging results in their entirety on your MyChart account.  Please discuss all results of fully with your primary care provider and other specialist at your follow-up visit.  Note: Portions of this text may have been  transcribed using voice recognition software. Every effort was made to ensure accuracy; however, inadvertent computerized transcription errors may still be present.

## 2022-05-27 NOTE — ED Provider Notes (Signed)
[facility] DEPT Provider Note   CSN: [id number] Arrival date & time: 05/27/22  [id number]     History  Chief Complaint  Patient presents with   Emesis    Emily Callahan is a 26 y.o. female reports a history of IBS, migraines and asthma.  Patient reports that her 28-year-old son is at home sick with croup for the past 1-2 weeks.  Patient reports her symptoms began 2-3 days ago she reports generalized body aches, headache, nonproductive cough and nausea.  Patient reports she has had 2 episodes of nonbloody nonbilious emesis over the past 24 hours.  She reports that she has IBS and has diarrhea at baseline without change, she denies any dark or bloody stools.  Patient denies fever, sore throat, vision change, neck stiffness, chest pain/shortness of breath, hemoptysis, abdominal pain, dysuria/hematuria, extremity swelling/color change, fall, injury or any additional concerns.   HPI     Home Medications Prior to Admission medications   Medication Sig Start Date End Date Taking? Authorizing Provider  ondansetron (ZOFRAN-ODT) 4 MG disintegrating tablet Take 1 tablet (4 mg total) by mouth every 12 (twelve) hours as needed for nausea or vomiting. 05/27/22  Yes Nuala Alpha A, PA-C  acetaminophen (TYLENOL) 500 MG tablet Take 1,000 mg by mouth every 6 (six) hours as needed for mild pain, moderate pain, fever or headache.    [provider]  albuterol (VENTOLIN HFA) 108 (90 Base) MCG/ACT inhaler Inhale 2 puffs into the lungs every 4 (four) hours as needed for wheezing or shortness of breath.  12/14/19   [provider]  rizatriptan (MAXALT-MLT) 10 MG disintegrating tablet Take 1 tablet (10 mg total) by mouth as needed for migraine. May repeat in 2 hours if needed 02/06/22   Horton, Drue Dun M, DO  loratadine (CLARITIN) 10 MG tablet Take 1 tablet (10 mg total) by mouth daily. Patient not taking: Reported on 09/07/2018 10/28/16 12/25/20  Shelly Bombard, MD       Allergies    Patient has no known allergies.    Review of Systems   Review of Systems Ten systems are reviewed and are negative for acute change except as noted in the HPI  Physical Exam Updated Vital Signs BP 118/87   Pulse 81   Temp 98.5 F (36.9 C)   Resp 20   SpO2 100%  Physical Exam Constitutional:      General: She is not in acute distress.    Appearance: Normal appearance. She is well-developed. She is not ill-appearing or diaphoretic.  HENT:     Head: Normocephalic and atraumatic.     Jaw: There is normal jaw occlusion.     Right Ear: External ear normal.     Left Ear: External ear normal.     Nose: Rhinorrhea present. Rhinorrhea is clear.     Mouth/Throat:     Mouth: Mucous membranes are moist.     Pharynx: Oropharynx is clear. Uvula midline. No pharyngeal swelling or posterior oropharyngeal erythema.  Eyes:     General: Vision grossly intact. Gaze aligned appropriately.     Extraocular Movements: Extraocular movements intact.     Conjunctiva/sclera: Conjunctivae normal.     Pupils: Pupils are equal, round, and reactive to light.  Neck:     Trachea: Trachea and phonation normal. No tracheal tenderness or tracheal deviation.     Meningeal: Brudzinski's sign absent.  Cardiovascular:     Rate and Rhythm: Normal rate and regular rhythm.     Heart  sounds: Normal heart sounds.  Pulmonary:     Effort: Pulmonary effort is normal. No respiratory distress.     Breath sounds: Normal breath sounds and air entry.  Abdominal:     General: There is no distension.     Palpations: Abdomen is soft.     Tenderness: There is no abdominal tenderness. There is no guarding or rebound.  Musculoskeletal:        General: Normal range of motion.     Cervical back: Normal range of motion.     Right lower leg: No edema.     Left lower leg: No edema.  Skin:    General: Skin is warm and dry.  Neurological:     Mental Status: She is alert.     GCS: GCS eye subscore is 4. GCS  verbal subscore is 5. GCS motor subscore is 6.     Comments: Speech is clear and goal oriented, follows commands Major Cranial nerves without deficit, no facial droop Moves extremities without ataxia, coordination intact  Psychiatric:        Behavior: Behavior normal.     ED Results / Procedures / Treatments   Labs (all labs ordered are listed, but only abnormal results are displayed) Labs Reviewed  CBC WITH DIFFERENTIAL/PLATELET - Abnormal; Notable for the following components:      Result Value   Hemoglobin 11.7 (*)    MCH 25.6 (*)    RDW 16.0 (*)    All other components within normal limits  COMPREHENSIVE METABOLIC PANEL - Abnormal; Notable for the following components:   Chloride 112 (*)    Glucose, Bld 103 (*)    All other components within normal limits  LIPASE, BLOOD - Abnormal; Notable for the following components:   Lipase 58 (*)    All other components within normal limits  URINALYSIS, ROUTINE W REFLEX MICROSCOPIC - Abnormal; Notable for the following components:   APPearance HAZY (*)    Leukocytes,Ua LARGE (*)    Bacteria, UA RARE (*)    All other components within normal limits  RESP PANEL BY RT-PCR (FLU A&B, COVID) ARPGX2  I-STAT BETA HCG BLOOD, ED ([facility], [facility], [facility] ONLY)    EKG None  Radiology DG Chest Portable 1 View  Result Date: 05/27/2022 CLINICAL DATA:  Nausea, vomiting, diarrhea, headache, congestion EXAM: PORTABLE CHEST 1 VIEW COMPARISON:  Chest radiograph 12/30/2021 FINDINGS: The heart size and mediastinal contours are within normal limits. Both lungs are clear. The visualized skeletal structures are unremarkable. IMPRESSION: No active disease. Electronically Signed   By: Marin Roberts M.D.   On: 05/27/2022 09:37    Procedures Procedures    Medications Ordered in ED Medications  sodium chloride 0.9 % bolus 1,000 mL (0 mLs Intravenous Stopped 05/27/22 0954)  acetaminophen (TYLENOL) tablet 650 mg (650 mg Oral Given 05/27/22 [id number])    ED Course/ Medical  Decision Making/ A&P Clinical Course as of 05/27/22 1008  Wed May 27, 2022  0944 CBC with Differential(!) CBC shows mild anemia of 11.7, slightly improved from 4 months ago at 11.3.  Low suspicion for symptomatic anemia.  No leukocytosis to suggest bacterial infection.  No thrombocytopenia. [BM]  0945 Comprehensive metabolic panel(!) CMP without emergent electrolyte derangement, AKI, LFT elevations or gap. [BM]  0945 Lipase, blood(!) Lipase slightly elevated at 58, low suspicion for pancreatitis. [BM]  0945 I-Stat Beta hCG blood, ED ([facility], [facility], [facility] only) Pregnancy test negative [BM]  0945 Resp Panel by RT-PCR (Flu A&B, Covid) Anterior Nasal Swab  COVID/influenza panel negative [BM]  0945 DG Chest Portable 1 View I have personally reviewed and interpreted patient's 1 view chest x-ray.  I do not appreciate any obvious PTX, PNA, effusion or other acute cardiopulmonary pathology. [BM]  0959 Urinalysis, Routine w reflex microscopic Urine, Clean Catch(!) Urinalysis shows large leukocytes, 0-5 WBCs, 6-10 squamous cells and rare bacteria.  Appears as contaminated catch.  Patient without urinary symptoms.  Low suspicion for UTI. [BM]    Clinical Course User Index [BM] Gari Crown                           Medical Decision Making 26 year old female with history of migraine, IBS and asthma presented to the ER for 2-3 days of URI symptoms.  She is experiencing nonproductive cough, body aches, headache and nausea.  She reports 2 episodes of nonbloody/nonbilious emesis over the past 24 hours.  She reports her 60-year-old son is at home sick with croup over the past 2 weeks.  On evaluation patient is in no acute distress, vital signs are within normal limits on room air. Cranial nerves intact.  Airway clear.  No erythema or swelling to suggest PTA, RPA, Ludwick's or other deep space infections of the head or neck.  No meningeal signs.  Cardiopulmonary examination unremarkable.  Abdomen is soft  nontender and without peritoneal signs.  She is neurovascular intact to all 4 extremities without evidence of DVT.  Differential includes but not limited to viral illness, PNA, gastroenteritis, cholecystitis, bronchitis, meningitis. - Plan of care is obtained abdominal pain labs given history of nausea/vomiting.  COVID/influenza panel.  Chest x-ray.  Amount and/or Complexity of Data Reviewed External Data Reviewed: notes.    Details: Reviewed prior ER visits. Labs: ordered. Decision-making details documented in ED Course. Radiology: ordered. Decision-making details documented in ED Course.  Risk OTC drugs. Prescription drug management. Risk Details: Patient was reassessed she is well-appearing no acute distress.  She reports she is feeling better after receiving IV fluids and Tylenol here.  Overall labs and chest x-ray are reassuring, reviewed as above.  Low suspicion for pneumonia, PTA, RPA, meningitis, cholecystitis or other emergent causes of her body aches, headaches and nonproductive cough.  Appears patient is experiencing a viral URI which is supported by the fact that her son is sick at home with similar symptoms.  There is no indication for further ER work-up or hospital admission at this point.  Patient was encouraged to maintain hydration and she may continue using OTC Tylenol to help with her symptoms.  Patient was provided with a prescription for Zofran to help with nausea.  Patient has no additional complaints or concerns she states understanding of care plan and is agreeable for discharge.  She declined need for work note, she works from home.  I discussed strict ER return precautions with the patient.   At this time there does not appear to be any evidence of an acute emergency medical condition and the patient appears stable for discharge with appropriate outpatient follow up. Diagnosis was discussed with patient who verbalizes understanding of care plan and is agreeable to  discharge. I have discussed return precautions with patient who verbalizes understanding. Patient encouraged to follow-up with their PCP. All questions answered.   Note: Portions of this report may have been transcribed using voice recognition software. Every effort was made to ensure accuracy; however, inadvertent computerized transcription errors may still be present.  Final Clinical Impression(s) / ED Diagnoses Final diagnoses:  Viral illness    Rx / DC Orders ED Discharge Orders          Ordered    ondansetron (ZOFRAN-ODT) 4 MG disintegrating tablet  Every 12 hours PRN        05/27/22 0958              Deliah Boston, PA-C 05/27/22 Nipomo, Nance, DO 05/27/22 1205

## 2022-05-27 NOTE — ED Triage Notes (Signed)
Pt arrived via POV, c/o n/v diarrhea, headache and congestion

## 2022-07-11 IMAGING — CT CT ABD-PELV W/ CM
2 of 4 series · 15 of 46 positions shown, 17 images · IV contrast (agent unspecified)
Comparison: CT examination dated May 08, 2020

CLINICAL DATA: Left upper quadrant and left lower quadrant
abdominal pain for 2 weeks. History of IBS.

EXAM:
CT ABDOMEN AND PELVIS WITH CONTRAST
TECHNIQUE: Multidetector CT imaging of the abdomen and pelvis was performed
using the standard protocol following bolus administration of
intravenous contrast.

[Series 2: axial st · axial · 0.68mm/px · z∈[+1079,+1479]mm · 12 of 90 slices shown, 14 images]
[im 5/90  soft-tissue]
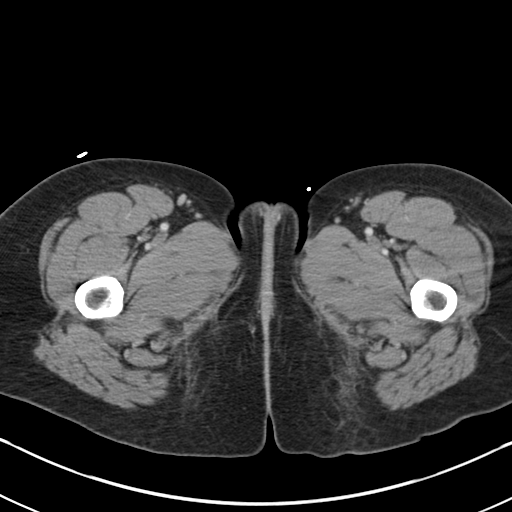
[im 5/90  bone]
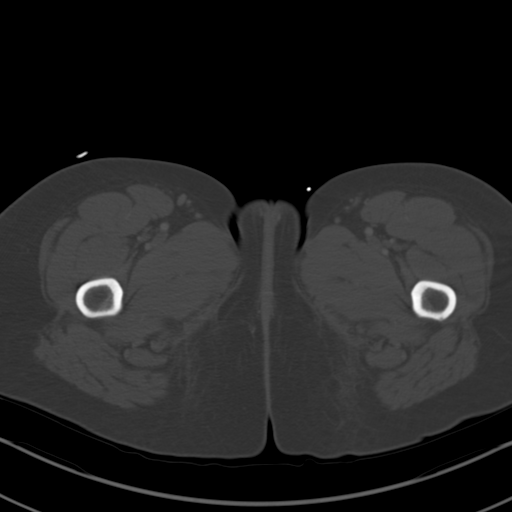
[im 15/90  soft-tissue]
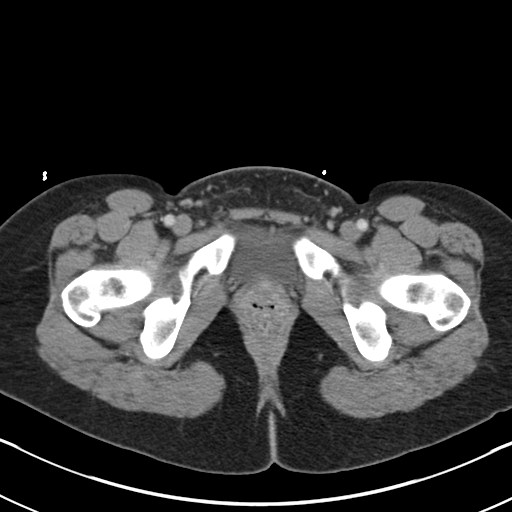
[im 19/90  soft-tissue]
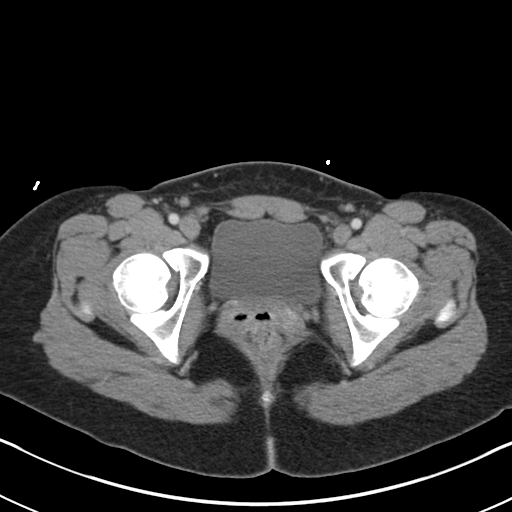
[im 29/90  soft-tissue]
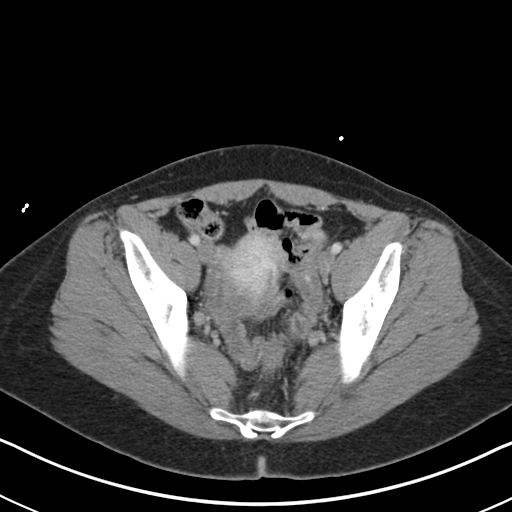
[im 33/90  soft-tissue]
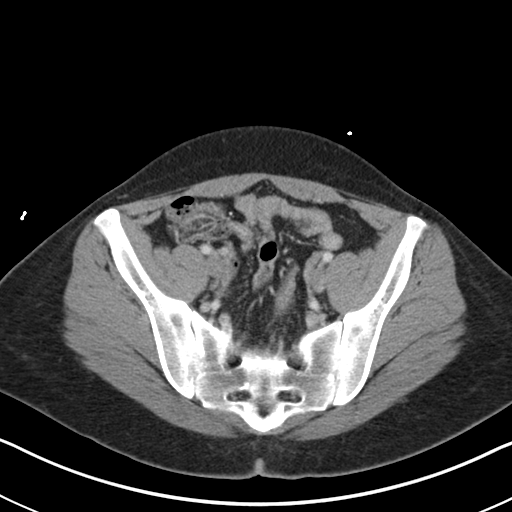
[im 43/90  soft-tissue]
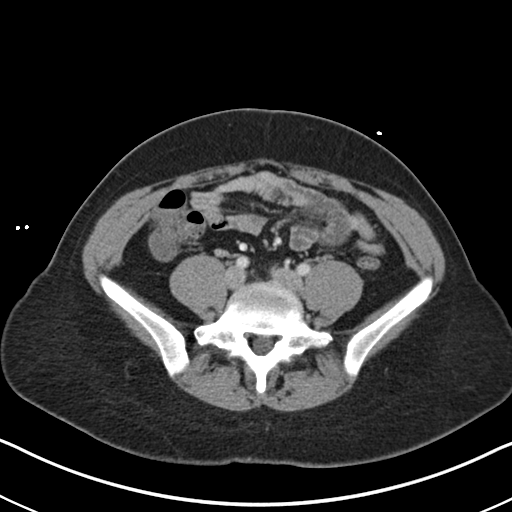
[im 47/90  soft-tissue]
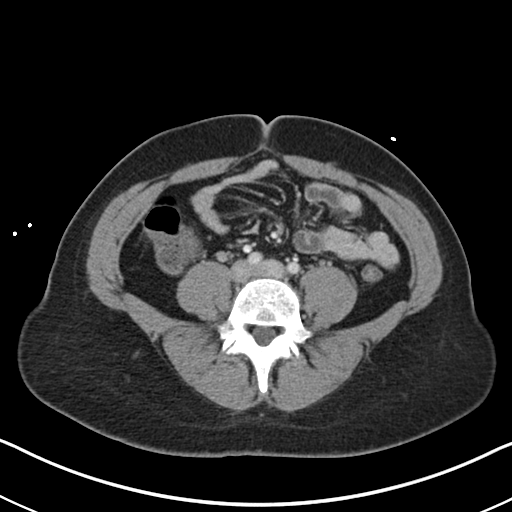
[im 57/90  soft-tissue]
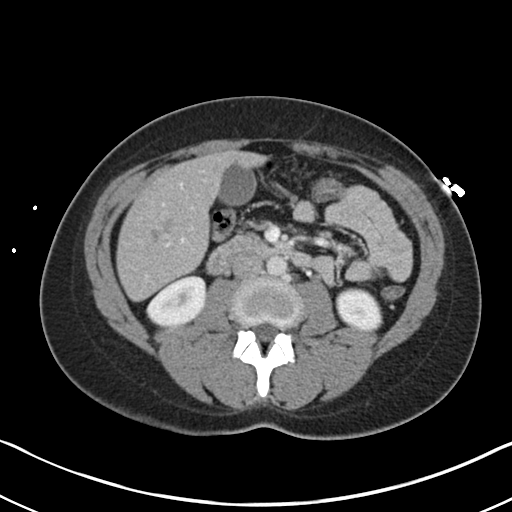
[im 61/90  soft-tissue]
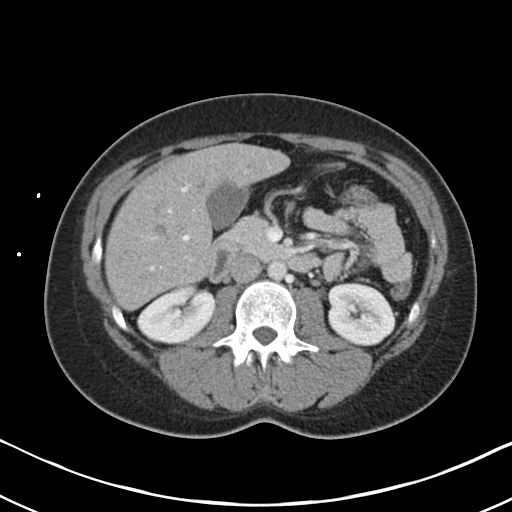
[im 61/90  bone]
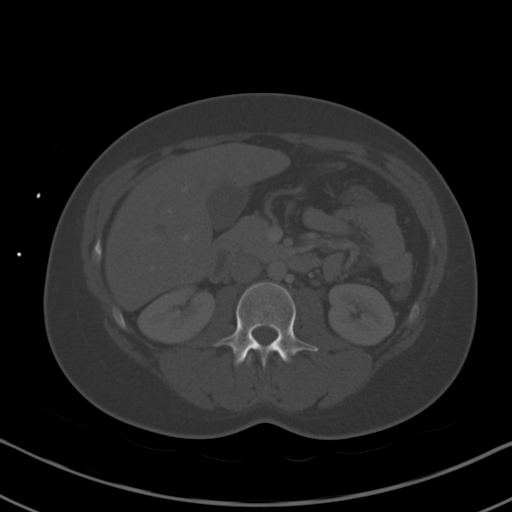
[im 71/90  soft-tissue]
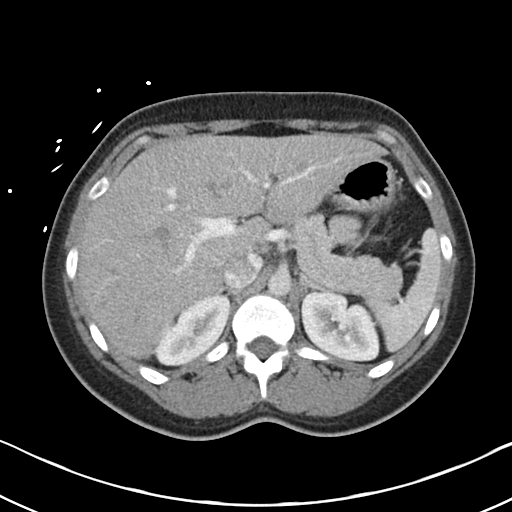
[im 75/90  soft-tissue]
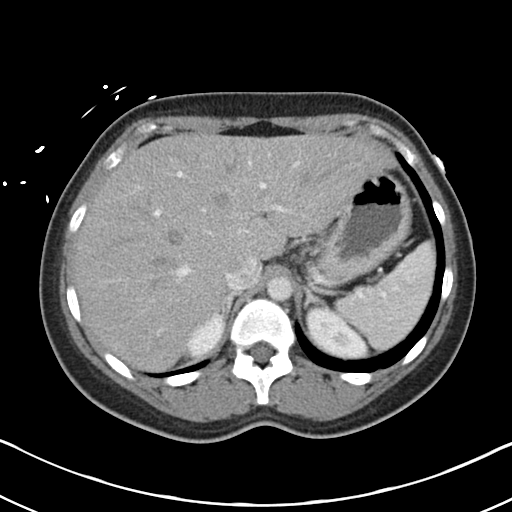
[im 85/90  soft-tissue]
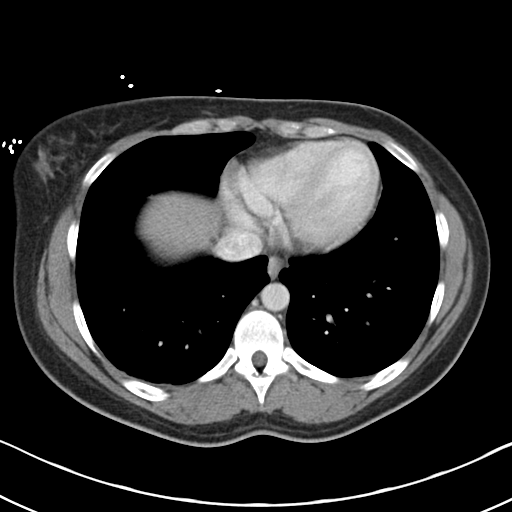

[Series 5: coronal st · coronal · 0.64mm/px · 3 of 134 slices shown]
[im 45/134  soft-tissue]
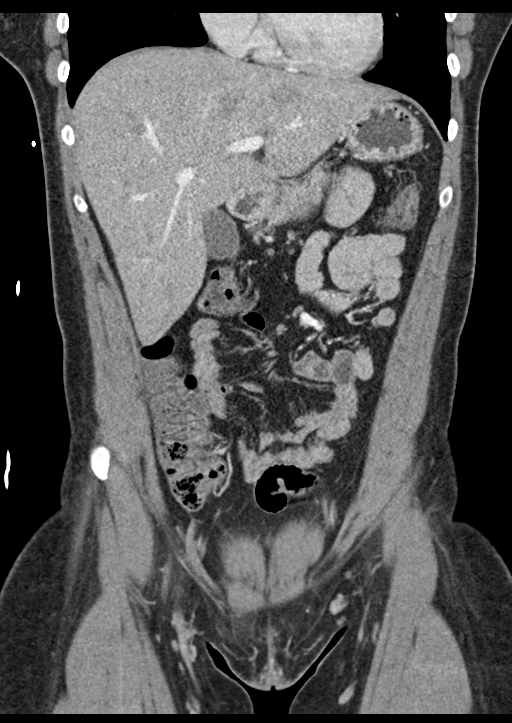
[im 60/134  soft-tissue]
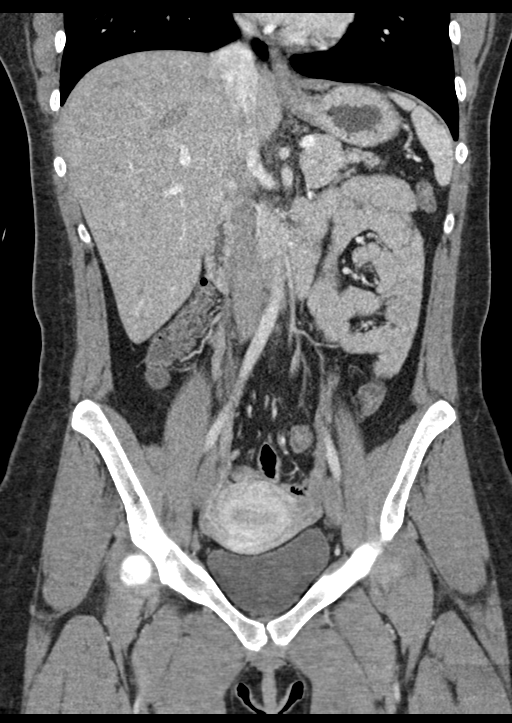
[im 74/134  soft-tissue]
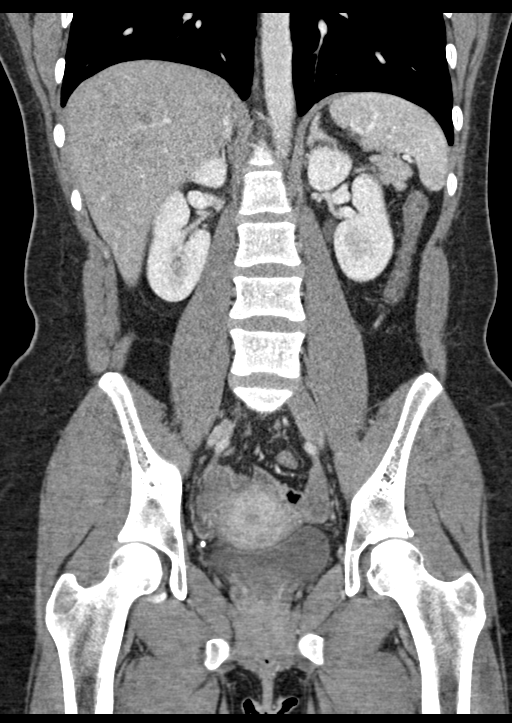

[15 of 46 positions shown; findings below may reference images not displayed]

RADIATION DOSE REDUCTION: This exam was performed according to the
departmental dose-optimization program which includes automated
exposure control, adjustment of the mA and/or kV according to
patient size and/or use of iterative reconstruction technique.

CONTRAST:  100mL OMNIPAQUE IOHEXOL 300 MG/ML  SOLN
FINDINGS: Lower chest: No acute abnormality.

Hepatobiliary: No focal liver abnormality is seen. Liver is mildly
enlarged measuring 19.2 cm in craniocaudal dimension. No gallstones,
gallbladder wall thickening, or biliary dilatation.

Pancreas: Unremarkable. No pancreatic ductal dilatation or
surrounding inflammatory changes.

Spleen: Normal in size without focal abnormality.

Adrenals/Urinary Tract: Adrenal glands are unremarkable. Kidneys are
normal, without renal calculi, focal lesion, or hydronephrosis.
Bladder is unremarkable.

Stomach/Bowel: Stomach is within normal limits. Appendix appears
normal. No evidence of bowel wall thickening, distention, or
inflammatory changes.

Vascular/Lymphatic: No significant vascular findings are present. No
enlarged abdominal or pelvic lymph nodes.

Reproductive: Uterus and bilateral adnexa are unremarkable.

Other: No abdominal wall hernia or abnormality. No abdominopelvic
ascites.

Musculoskeletal: No acute or significant osseous findings.
IMPRESSION: 1. Bowel loops are normal in caliber without evidence of significant
bowel wall thickening or inflammatory changes. Normal appendix.

2.  No evidence of nephrolithiasis or hydronephrosis.

3.  Uterus and bilateral adnexa unremarkable.

4.  No CT evidence of acute abdominal/pelvic process.

## 2022-07-31 ENCOUNTER — Encounter (HOSPITAL_COMMUNITY): Payer: Self-pay | Admitting: *Deleted

## 2022-07-31 ENCOUNTER — Emergency Department (HOSPITAL_COMMUNITY)
Admission: EM | Admit: 2022-07-31 | Discharge: 2022-07-31 | Disposition: A | Discharge status: Home / Self Care | Payer: Medicaid Other | Attending: Emergency Medicine | Admitting: Emergency Medicine

## 2022-07-31 ENCOUNTER — Other Ambulatory Visit: Payer: Self-pay

## 2022-07-31 ENCOUNTER — Emergency Department (HOSPITAL_COMMUNITY): Payer: Medicaid Other

## 2022-07-31 DIAGNOSIS — S99912A Unspecified injury of left ankle, initial encounter: Secondary | ICD-10-CM | POA: Diagnosis present

## 2022-07-31 DIAGNOSIS — X500XXA Overexertion from strenuous movement or load, initial encounter: Secondary | ICD-10-CM | POA: Diagnosis not present

## 2022-07-31 DIAGNOSIS — S93402A Sprain of unspecified ligament of left ankle, initial encounter: Secondary | ICD-10-CM | POA: Insufficient documentation

## 2022-07-31 MED ORDER — ACETAMINOPHEN 325 MG PO TABS
650.0000 mg | ORAL_TABLET | Freq: Once | ORAL | Status: AC
  Administered 2022-07-31: 650 mg via ORAL
  Filled 2022-07-31: qty 2

## 2022-07-31 MED ORDER — NAPROXEN 500 MG PO TABS
500.0000 mg | ORAL_TABLET | Freq: Once | ORAL | Status: AC
Start: 2022-07-31 — End: 2022-07-31
  Administered 2022-07-31: 500 mg via ORAL
  Filled 2022-07-31: qty 1

## 2022-07-31 MED ORDER — ACETAMINOPHEN 500 MG PO TABS: 500.0000 mg | ORAL_TABLET | Freq: Four times a day (QID) | ORAL | 0 refills | Status: DC | PRN

## 2022-07-31 MED ORDER — IBUPROFEN 600 MG PO TABS: 600.0000 mg | ORAL_TABLET | Freq: Four times a day (QID) | ORAL | 0 refills | Status: DC | PRN

## 2022-07-31 NOTE — Discharge Instructions (Signed)
The x-ray does not reveal any evidence of fracture.  You likely have a high-grade sprain.  We have provided you with ankle splint/brace and crutches.  Weightbearing as tolerated is allowed.  Read the RICE instruction provided.  Call the orthopedist at the number provided and set up an appointment in 10 to 14 days.

## 2022-07-31 NOTE — ED Triage Notes (Signed)
Pt rolled ankle as she missed a step yesterday, swelling noted to left ankle and tenderness

## 2022-07-31 NOTE — ED Provider Notes (Signed)
[facility]-EMERGENCY DEPT Provider Note   CSN: [id number] Arrival date & time: 07/31/22  0720     History  Chief Complaint  Patient presents with   Ankle Pain    Emily Callahan is a 26 y.o. female.  HPI     26 year old female comes in with chief complaint of ankle pain.  She missed a step yesterday, rolled her ankle.  Patient is having pain over her left ankle since then.  She is able to bear weight with significant discomfort.  Denies injury elsewhere.  Patient drove to the emergency room this morning, wondering if she broke it.  Home Medications Prior to Admission medications   Medication Sig Start Date End Date Taking? Authorizing Provider  acetaminophen (TYLENOL) 500 MG tablet Take 1 tablet (500 mg total) by mouth every 6 (six) hours as needed. 07/31/22  Yes Derwood Kaplan, MD  ibuprofen (ADVIL) 600 MG tablet Take 1 tablet (600 mg total) by mouth every 6 (six) hours as needed. 07/31/22  Yes Derwood Kaplan, MD  albuterol (VENTOLIN HFA) 108 (90 Base) MCG/ACT inhaler Inhale 2 puffs into the lungs every 4 (four) hours as needed for wheezing or shortness of breath.  12/14/19   [provider]  ondansetron (ZOFRAN-ODT) 4 MG disintegrating tablet Take 1 tablet (4 mg total) by mouth every 12 (twelve) hours as needed for nausea or vomiting. 05/27/22   Harlene Salts A, PA-C  rizatriptan (MAXALT-MLT) 10 MG disintegrating tablet Take 1 tablet (10 mg total) by mouth as needed for migraine. May repeat in 2 hours if needed 02/06/22   Horton, Danford Bad M, DO  loratadine (CLARITIN) 10 MG tablet Take 1 tablet (10 mg total) by mouth daily. Patient not taking: Reported on 09/07/2018 10/28/16 12/25/20  Brock Bad, MD      Allergies    Patient has no known allergies.    Review of Systems   Review of Systems  Physical Exam Updated Vital Signs BP 114/83 (BP Location: Right Arm)   Pulse 81   Temp 98.4 F (36.9 C) (Oral)   Resp 16   Ht 5\' 4"  (1.626 m)   Wt  65.8 kg   LMP 07/26/2022 (Approximate)   SpO2 100%   BMI 24.89 kg/m  Physical Exam Vitals and nursing note reviewed.  Constitutional:      Appearance: She is well-developed.  HENT:     Head: Atraumatic.  Cardiovascular:     Rate and Rhythm: Normal rate.  Pulmonary:     Effort: Pulmonary effort is normal.  Musculoskeletal:     Cervical back: Normal range of motion and neck supple.     Comments: Left ankle is swollen over the lateral aspect, with the localized edema there is localized tenderness to palpation.  No gross deformity.  No ecchymosis.  Skin:    General: Skin is warm and dry.  Neurological:     Mental Status: She is alert and oriented to person, place, and time.     ED Results / Procedures / Treatments   Labs (all labs ordered are listed, but only abnormal results are displayed) Labs Reviewed - No data to display  EKG None  Radiology DG Ankle Complete Left  Result Date: 07/31/2022 CLINICAL DATA:  Fall with ankle pain. EXAM: LEFT ANKLE COMPLETE - 3+ VIEW COMPARISON:  Ankle radiographs dated 03/16/2010. FINDINGS: There is no evidence of fracture, dislocation, or joint effusion. There is no evidence of arthropathy or other focal bone abnormality. There is mild soft tissue swelling  around the ankle. IMPRESSION: No acute osseous injury. Electronically Signed   By: Zerita Boers M.D.   On: 07/31/2022 08:13    Procedures Procedures    Medications Ordered in ED Medications  naproxen (NAPROSYN) tablet 500 mg (500 mg Oral Given 07/31/22 0827)  acetaminophen (TYLENOL) tablet 650 mg (650 mg Oral Given 07/31/22 0827)    ED Course/ Medical Decision Making/ A&P                           Medical Decision Making 26 year old female comes in with chief complaint of fall and resultant ankle injury.  Differential diagnosis includes ankle fracture, high-grade ankle sprain/strain.  X-ray ordered and interpreted independently.  No evidence of fracture over the fibular aspect  of the ankle.  Likely ankle sprain.  RICE treatment recommended.  Crutches provided.  Advised orthopedic follow-up in 10 to 14 days.  Problems Addressed: Sprain of left ankle, unspecified ligament, initial encounter: acute illness or injury  Amount and/or Complexity of Data Reviewed Radiology: ordered and independent interpretation performed.  Risk OTC drugs. Prescription drug management.    Final Clinical Impression(s) / ED Diagnoses Final diagnoses:  Sprain of left ankle, unspecified ligament, initial encounter    Rx / DC Orders ED Discharge Orders          Ordered    acetaminophen (TYLENOL) 500 MG tablet  Every 6 hours PRN        07/31/22 0837    ibuprofen (ADVIL) 600 MG tablet  Every 6 hours PRN        07/31/22 0837              Varney Biles, MD 07/31/22 [phone]

## 2022-11-05 ENCOUNTER — Encounter: Payer: Self-pay | Admitting: Radiology

## 2022-11-05 ENCOUNTER — Other Ambulatory Visit (HOSPITAL_COMMUNITY)
Admission: RE | Admit: 2022-11-05 | Discharge: 2022-11-05 | Disposition: A | Discharge status: Home / Self Care | Payer: Medicaid Other | Source: Ambulatory Visit | Attending: Radiology | Admitting: Radiology

## 2022-11-05 ENCOUNTER — Ambulatory Visit (INDEPENDENT_AMBULATORY_CARE_PROVIDER_SITE_OTHER): Payer: Medicaid Other | Admitting: Radiology

## 2022-11-05 VITALS — BP 108/70 | Ht 63.75 in | Wt 154.0 lb

## 2022-11-05 DIAGNOSIS — R87810 Cervical high risk human papillomavirus (HPV) DNA test positive: Secondary | ICD-10-CM | POA: Insufficient documentation

## 2022-11-05 DIAGNOSIS — Z113 Encounter for screening for infections with a predominantly sexual mode of transmission: Secondary | ICD-10-CM

## 2022-11-05 DIAGNOSIS — Z01419 Encounter for gynecological examination (general) (routine) without abnormal findings: Secondary | ICD-10-CM

## 2022-11-05 DIAGNOSIS — R87610 Atypical squamous cells of undetermined significance on cytologic smear of cervix (ASC-US): Secondary | ICD-10-CM | POA: Insufficient documentation

## 2022-11-05 NOTE — Progress Notes (Signed)
   Emily Callahan Dec 29, 1995 [id number]   History:  27 y.o. G1P1 presents for annual exam. Has some LLQ pain with diarrhea, previous diagnosis of IBS. Hx of abnormal pap without follow up.  Gynecologic History Patient's last menstrual period was 10/22/2022 (exact date). Period Cycle (Days): 28 Period Duration (Days): 5 Period Pattern: Regular Menstrual Flow: Moderate Menstrual Control: Tampon Dysmenorrhea: (!) Moderate Dysmenorrhea Symptoms: Cramping Contraception/Family planning: condoms Sexually active: yes Last Pap: 2018. Results were: abnormal ASCUS HPV +   Obstetric History OB History  Gravida Para Term Preterm AB Living  1 1 1 $ 0 0 1  SAB IAB Ectopic Multiple Live Births  0 0 0 0 1    # Outcome Date GA Lbr Len/2nd Weight Sex Delivery Anes PTL Lv  1 Term 04/06/17 [id number] / 00:25 5 lb 11.2 oz (2.586 kg) M Vag-Spont EPI  LIV     The following portions of the patient's history were reviewed and updated as appropriate: allergies, current medications, past family history, past medical history, past social history, past surgical history, and problem list.  Review of Systems Pertinent items noted in HPI and remainder of comprehensive ROS otherwise negative.   Past medical history, past surgical history, family history and social history were all reviewed and documented in the EPIC chart.   Exam:  Vitals:   11/05/22 1140  BP: 108/70  Weight: 154 lb (69.9 kg)  Height: 5' 3.75" (1.619 m)   Body mass index is 26.64 kg/m.  General appearance:  Normal Thyroid:  Symmetrical, normal in size, without palpable masses or nodularity. Respiratory  Auscultation:  Clear without wheezing or rhonchi Cardiovascular  Auscultation:  Regular rate, without rubs, murmurs or gallops  Edema/varicosities:  Not grossly evident Abdominal  Soft,nontender, without masses, guarding or rebound.  Liver/spleen:  No organomegaly noted  Hernia:  None appreciated  Skin  Inspection:  Grossly  normal Breasts: Examined lying and sitting.   Right: Without masses, retractions, nipple discharge or axillary adenopathy.   Left: Without masses, retractions, nipple discharge or axillary adenopathy. Genitourinary   Inguinal/mons:  Normal without inguinal adenopathy  External genitalia:  Normal appearing vulva with no masses, tenderness, or lesions  BUS/Urethra/Skene's glands:  Normal without masses or exudate  Vagina:  Normal appearing with normal color and discharge, no lesions  Cervix:  Normal appearing without discharge or lesions  Uterus:  Normal in size, shape and contour.  Mobile, nontender  Adnexa/parametria:     Rt: Normal in size, without masses or tenderness.   Lt: Normal in size, without masses or tenderness.  Anus and perineum: Normal   Patient informed chaperone available to be present for breast and pelvic exam. Patient has requested no chaperone to be present. Patient has been advised what will be completed during breast and pelvic exam.   Assessment/Plan:   1. Well woman exam with routine gynecological exam Declines BC other than condoms, phexxi discussed, will let u[place]ore[place]now if she changes her mind  2. ASCUS with positive high risk HPV cervical - Cytology - PAP( [place])  3. Screening for STDs (sexually transmitted diseases) - Cytology - PAP( [employer])     Discussed SBE, colonoscopy and DEXA screening as directed/appropriate. Recommend 1588ms of exercise weekly, including weight bearing exercise. Encouraged the use of seatbelts and sunscreen. Return in 1 year for annual or as needed.   CHRubbie Battiest WHNP-BC 12:02 PM 11/05/2022

## 2022-11-11 LAB — CYTOLOGY - PAP
Chlamydia: POSITIVE — AB
Comment: NEGATIVE
Comment: NEGATIVE
Comment: NEGATIVE
Comment: NORMAL
Diagnosis: UNDETERMINED — AB
High risk HPV: NEGATIVE
Neisseria Gonorrhea: NEGATIVE
Trichomonas: NEGATIVE

## 2022-11-26 ENCOUNTER — Other Ambulatory Visit: Payer: Self-pay

## 2022-11-26 MED ORDER — METRONIDAZOLE 0.75 % VA GEL: VAGINAL | 0 refills | Status: DC

## 2022-11-26 MED ORDER — DOXYCYCLINE HYCLATE 100 MG PO CAPS: 100.0000 mg | ORAL_CAPSULE | Freq: Two times a day (BID) | ORAL | 0 refills | Status: DC

## 2023-01-04 ENCOUNTER — Ambulatory Visit: Payer: Medicaid Other | Admitting: Nurse Practitioner

## 2023-01-18 ENCOUNTER — Encounter: Payer: Self-pay | Admitting: Nurse Practitioner

## 2023-01-27 ENCOUNTER — Telehealth: Payer: Self-pay | Admitting: Nurse Practitioner

## 2023-01-27 NOTE — Telephone Encounter (Signed)
Patient no show to her test of cure appointment on April 29. Left message on May 7 & 8 and mailed letter on May 13 for patient to call and reschedule missed appointment. Patient has not rescheduled.

## 2023-02-03 NOTE — Telephone Encounter (Signed)
Left message to call Noreene Larsson, RN at [facility], [phone], option 5.Left detailed message, ok per dpr. Advised calling to reschedule f/u testing. Return call to office to provide update if testing completed with another provider or to schedule.

## 2023-02-22 NOTE — Telephone Encounter (Signed)
Left message to call Noreene Larsson, RN at [facility], [phone], option 5.Left detailed message, ok per dpr. Advised calling to reschedule f/u testing.

## 2023-03-10 ENCOUNTER — Other Ambulatory Visit: Payer: Self-pay

## 2023-03-10 ENCOUNTER — Inpatient Hospital Stay (HOSPITAL_COMMUNITY): Payer: Medicaid Other

## 2023-03-10 ENCOUNTER — Inpatient Hospital Stay (HOSPITAL_COMMUNITY)
Admission: EM | Admit: 2023-03-10 | Discharge: 2023-03-13 | DRG: 871 | Disposition: A | Discharge status: Home / Self Care | Payer: Medicaid Other | Attending: Family Medicine | Admitting: Family Medicine

## 2023-03-10 ENCOUNTER — Emergency Department (HOSPITAL_COMMUNITY): Payer: Medicaid Other

## 2023-03-10 ENCOUNTER — Encounter (HOSPITAL_COMMUNITY): Payer: Self-pay

## 2023-03-10 DIAGNOSIS — Z79899 Other long term (current) drug therapy: Secondary | ICD-10-CM | POA: Diagnosis not present

## 2023-03-10 DIAGNOSIS — E872 Acidosis, unspecified: Secondary | ICD-10-CM | POA: Diagnosis present

## 2023-03-10 DIAGNOSIS — N61 Mastitis without abscess: Secondary | ICD-10-CM | POA: Diagnosis present

## 2023-03-10 DIAGNOSIS — J189 Pneumonia, unspecified organism: Secondary | ICD-10-CM

## 2023-03-10 DIAGNOSIS — Z1152 Encounter for screening for COVID-19: Secondary | ICD-10-CM

## 2023-03-10 DIAGNOSIS — K589 Irritable bowel syndrome without diarrhea: Secondary | ICD-10-CM | POA: Diagnosis present

## 2023-03-10 DIAGNOSIS — Z8249 Family history of ischemic heart disease and other diseases of the circulatory system: Secondary | ICD-10-CM | POA: Diagnosis not present

## 2023-03-10 DIAGNOSIS — N644 Mastodynia: Secondary | ICD-10-CM

## 2023-03-10 DIAGNOSIS — N611 Abscess of the breast and nipple: Secondary | ICD-10-CM | POA: Diagnosis present

## 2023-03-10 DIAGNOSIS — A419 Sepsis, unspecified organism: Secondary | ICD-10-CM | POA: Diagnosis not present

## 2023-03-10 DIAGNOSIS — E876 Hypokalemia: Secondary | ICD-10-CM | POA: Diagnosis present

## 2023-03-10 DIAGNOSIS — R6520 Severe sepsis without septic shock: Secondary | ICD-10-CM | POA: Diagnosis present

## 2023-03-10 DIAGNOSIS — J45909 Unspecified asthma, uncomplicated: Secondary | ICD-10-CM | POA: Diagnosis present

## 2023-03-10 DIAGNOSIS — F1290 Cannabis use, unspecified, uncomplicated: Secondary | ICD-10-CM | POA: Diagnosis present

## 2023-03-10 HISTORY — DX: Sepsis, unspecified organism: A41.9

## 2023-03-10 HISTORY — DX: Mastodynia: N64.4

## 2023-03-10 LAB — RESP PANEL BY RT-PCR (RSV, FLU A&B, COVID)  RVPGX2
Influenza A by PCR: NEGATIVE
Influenza B by PCR: NEGATIVE
Resp Syncytial Virus by PCR: NEGATIVE
SARS Coronavirus 2 by RT PCR: NEGATIVE

## 2023-03-10 LAB — COMPREHENSIVE METABOLIC PANEL
ALT: 21 U/L (ref 0–44)
AST: 29 U/L (ref 15–41)
Albumin: 3.5 g/dL (ref 3.5–5.0)
Alkaline Phosphatase: 43 U/L (ref 38–126)
Anion gap: 10 (ref 5–15)
BUN: 8 mg/dL (ref 6–20)
CO2: 23 mmol/L (ref 22–32)
Calcium: 8.8 mg/dL — ABNORMAL LOW (ref 8.9–10.3)
Chloride: 100 mmol/L (ref 98–111)
Creatinine, Ser: 0.91 mg/dL (ref 0.44–1.00)
GFR, Estimated: >60 mL/min (ref >60)
Glucose, Bld: 109 mg/dL — ABNORMAL HIGH (ref 70–99)
Potassium: 3.1 mmol/L — ABNORMAL LOW (ref 3.5–5.1)
Sodium: 133 mmol/L — ABNORMAL LOW (ref 135–145)
Total Bilirubin: 0.7 mg/dL (ref 0.3–1.2)
Total Protein: 7.8 g/dL (ref 6.5–8.1)

## 2023-03-10 LAB — RAPID URINE DRUG SCREEN, HOSP PERFORMED
Amphetamines: NOT DETECTED
Barbiturates: NOT DETECTED
Benzodiazepines: NOT DETECTED
Cocaine: NOT DETECTED
Opiates: POSITIVE — AB
Tetrahydrocannabinol: POSITIVE — AB

## 2023-03-10 LAB — CBC WITH DIFFERENTIAL/PLATELET
Abs Immature Granulocytes: 0.14 10*3/uL — ABNORMAL HIGH (ref 0.00–0.07)
Basophils Absolute: 0 10*3/uL (ref 0.0–0.1)
Basophils Relative: 0 %
Eosinophils Absolute: 0 10*3/uL (ref 0.0–0.5)
Eosinophils Relative: 0 %
HCT: 36.8 % (ref 36.0–46.0)
Hemoglobin: 11.1 g/dL — ABNORMAL LOW (ref 12.0–15.0)
Immature Granulocytes: 1 %
Lymphocytes Relative: 3 %
Lymphs Abs: 0.6 10*3/uL — ABNORMAL LOW (ref 0.7–4.0)
MCH: 25.1 pg — ABNORMAL LOW (ref 26.0–34.0)
MCHC: 30.2 g/dL (ref 30.0–36.0)
MCV: 83.1 fL (ref 80.0–100.0)
Monocytes Absolute: 1.2 10*3/uL — ABNORMAL HIGH (ref 0.1–1.0)
Monocytes Relative: 6 %
Neutro Abs: 18.4 10*3/uL — ABNORMAL HIGH (ref 1.7–7.7)
Neutrophils Relative %: 90 %
Platelets: 270 10*3/uL (ref 150–400)
RBC: 4.43 MIL/uL (ref 3.87–5.11)
RDW: 15.3 % (ref 11.5–15.5)
WBC: 20.4 10*3/uL — ABNORMAL HIGH (ref 4.0–10.5)
nRBC: 0 % (ref 0.0–0.2)

## 2023-03-10 LAB — LACTATE DEHYDROGENASE: LDH: 141 U/L (ref 98–192)

## 2023-03-10 LAB — URINALYSIS, ROUTINE W REFLEX MICROSCOPIC
Bilirubin Urine: NEGATIVE
Glucose, UA: NEGATIVE mg/dL
Hgb urine dipstick: NEGATIVE
Ketones, ur: NEGATIVE mg/dL
Leukocytes,Ua: NEGATIVE
Nitrite: NEGATIVE
Protein, ur: NEGATIVE mg/dL
Specific Gravity, Urine: 1.029 (ref 1.005–1.030)
pH: 5 (ref 5.0–8.0)

## 2023-03-10 LAB — MAGNESIUM: Magnesium: 1.8 mg/dL (ref 1.7–2.4)

## 2023-03-10 LAB — SEDIMENTATION RATE: Sed Rate: 35 mm/hr — ABNORMAL HIGH (ref 0–22)

## 2023-03-10 LAB — HCG, SERUM, QUALITATIVE: Preg, Serum: NEGATIVE

## 2023-03-10 LAB — TSH: TSH: 1.305 u[IU]/mL (ref 0.350–4.500)

## 2023-03-10 LAB — CK: Total CK: 72 U/L (ref 38–234)

## 2023-03-10 LAB — PROCALCITONIN: Procalcitonin: 0.41 ng/mL

## 2023-03-10 LAB — D-DIMER, QUANTITATIVE: D-Dimer, Quant: 1.37 ug/mL-FEU — ABNORMAL HIGH (ref 0.00–0.50)

## 2023-03-10 LAB — PROTIME-INR
INR: 1 (ref 0.8–1.2)
Prothrombin Time: 12.8 seconds (ref 11.4–15.2)

## 2023-03-10 LAB — PHOSPHORUS: Phosphorus: 1.8 mg/dL — ABNORMAL LOW (ref 2.5–4.6)

## 2023-03-10 LAB — APTT: aPTT: 22 seconds — ABNORMAL LOW (ref 24–36)

## 2023-03-10 LAB — LACTIC ACID, PLASMA
Lactic Acid, Venous: 2.2 mmol/L (ref 0.5–1.9)
Lactic Acid, Venous: 1.3 mmol/L (ref 0.5–1.9)

## 2023-03-10 MED ORDER — SODIUM CHLORIDE 0.9 % IV SOLN: 500.0000 mg | INTRAVENOUS | Status: DC

## 2023-03-10 MED ORDER — ACETAMINOPHEN 325 MG PO TABS
650.0000 mg | ORAL_TABLET | Freq: Once | ORAL | Status: AC | PRN
  Administered 2023-03-10: 650 mg via ORAL
  Filled 2023-03-10: qty 2

## 2023-03-10 MED ORDER — SODIUM CHLORIDE 0.9 % IV BOLUS
1000.0000 mL | Freq: Once | INTRAVENOUS | Status: AC
  Administered 2023-03-10: 1000 mL via INTRAVENOUS

## 2023-03-10 MED ORDER — POTASSIUM PHOSPHATES 15 MMOLE/5ML IV SOLN
15.0000 mmol | Freq: Once | INTRAVENOUS | Status: AC
  Administered 2023-03-11: 15 mmol via INTRAVENOUS
  Filled 2023-03-10: qty 5

## 2023-03-10 MED ORDER — MAGNESIUM SULFATE IN D5W 1-5 GM/100ML-% IV SOLN
1.0000 g | Freq: Once | INTRAVENOUS | Status: AC
  Administered 2023-03-11: 1 g via INTRAVENOUS
  Filled 2023-03-10: qty 100

## 2023-03-10 MED ORDER — POTASSIUM CHLORIDE CRYS ER 20 MEQ PO TBCR
40.0000 meq | EXTENDED_RELEASE_TABLET | Freq: Once | ORAL | Status: AC
  Administered 2023-03-10: 40 meq via ORAL
  Filled 2023-03-10: qty 2

## 2023-03-10 MED ORDER — LACTATED RINGERS IV SOLN
INTRAVENOUS | Status: AC
  Administered 2023-03-11: 150 mL/h via INTRAVENOUS

## 2023-03-10 MED ORDER — SODIUM BICARBONATE 650 MG PO TABS
650.0000 mg | ORAL_TABLET | Freq: Once | ORAL | Status: DC
  Filled 2023-03-10: qty 1

## 2023-03-10 MED ORDER — IOHEXOL 300 MG/ML  SOLN
100.0000 mL | Freq: Once | INTRAMUSCULAR | Status: AC | PRN
  Administered 2023-03-10: 100 mL via INTRAVENOUS

## 2023-03-10 MED ORDER — SODIUM CHLORIDE 0.9 % IV SOLN
2.0000 g | INTRAVENOUS | Status: DC
  Administered 2023-03-11: 2 g via INTRAVENOUS
  Filled 2023-03-10: qty 20

## 2023-03-10 MED ORDER — IOHEXOL 350 MG/ML SOLN
75.0000 mL | Freq: Once | INTRAVENOUS | Status: AC | PRN
  Administered 2023-03-10: 60 mL via INTRAVENOUS

## 2023-03-10 MED ORDER — MORPHINE SULFATE (PF) 4 MG/ML IV SOLN
4.0000 mg | Freq: Once | INTRAVENOUS | Status: AC
  Administered 2023-03-10: 4 mg via INTRAVENOUS
  Filled 2023-03-10: qty 1

## 2023-03-10 MED ORDER — VANCOMYCIN HCL 1500 MG/300ML IV SOLN
1500.0000 mg | Freq: Once | INTRAVENOUS | Status: AC
  Administered 2023-03-11: 1500 mg via INTRAVENOUS
  Filled 2023-03-10: qty 300

## 2023-03-10 MED ORDER — POTASSIUM CHLORIDE 10 MEQ/100ML IV SOLN
10.0000 meq | INTRAVENOUS | Status: AC
  Administered 2023-03-10 – 2023-03-11 (×4): 10 meq via INTRAVENOUS
  Filled 2023-03-10 (×4): qty 100

## 2023-03-10 MED ORDER — PANCRELIPASE (LIP-PROT-AMYL) 10440-39150 UNITS PO TABS
20880.0000 [IU] | ORAL_TABLET | Freq: Once | ORAL | Status: DC
  Filled 2023-03-10: qty 2

## 2023-03-10 MED ORDER — VANCOMYCIN HCL IN DEXTROSE 1-5 GM/200ML-% IV SOLN
1000.0000 mg | Freq: Two times a day (BID) | INTRAVENOUS | Status: DC
  Administered 2023-03-11 – 2023-03-12 (×2): 1000 mg via INTRAVENOUS
  Filled 2023-03-10 (×2): qty 200

## 2023-03-10 MED ORDER — ONDANSETRON HCL 4 MG/2ML IJ SOLN
4.0000 mg | Freq: Once | INTRAMUSCULAR | Status: AC
  Administered 2023-03-10: 4 mg via INTRAVENOUS
  Filled 2023-03-10: qty 2

## 2023-03-10 MED ORDER — SODIUM CHLORIDE 0.9 % IV SOLN
1.0000 g | Freq: Once | INTRAVENOUS | Status: AC
  Administered 2023-03-10: 1 g via INTRAVENOUS
  Filled 2023-03-10: qty 10

## 2023-03-10 MED ORDER — AZITHROMYCIN 250 MG PO TABS
500.0000 mg | ORAL_TABLET | Freq: Once | ORAL | Status: AC
  Administered 2023-03-10: 500 mg via ORAL
  Filled 2023-03-10: qty 2

## 2023-03-10 NOTE — Assessment & Plan Note (Signed)
Noted some mild redness and swelling Will obtain further imaging to RO breast abcess

## 2023-03-10 NOTE — Assessment & Plan Note (Signed)
-  SIRS criteria met with  elevated white blood cell count,       Component Value Date/Time   WBC 20.4 (H) 03/10/2023 1903   LYMPHSABS 0.6 (L) 03/10/2023 1903   LYMPHSABS 2.3 09/14/2016 1037     tachycardia   ,   fever   RR >20 Today's Vitals   03/10/23 1800 03/10/23 1845 03/10/23 1905 03/10/23 2029  BP: 105/66 109/61 111/67 105/64  Pulse: (!) 107 99 91 77  Resp: 13 20 (!) 22 19  Temp:    99 F (37.2 C)  TempSrc:    Oral  SpO2: 98% 100% 100% 100%  Weight:      Height:      PainSc:    5    Body mass index is 26.45 kg/m.  This patient meets SIRS Criteria and may be septic.    Patient meeting criteria for Severe sepsis with    evidence of end organ damage/organ dysfunction such as   elevated lactic acid >2     Component Value Date/Time   LATICACIDVEN 2.2 (HH) 03/10/2023 1950      - Obtain serial lactic acid and procalcitonin level.  - Initiated IV antibiotics in ER: Antibiotics Given (last 72 hours)     Date/Time Action Medication Dose Rate   03/10/23 2038 Given   azithromycin (ZITHROMAX) tablet 500 mg 500 mg    03/10/23 2038 New Bag/Given   cefTRIAXone (ROCEPHIN) 1 g in sodium chloride 0.9 % 100 mL IVPB 1 g 200 mL/hr       Will continue  on : rocephin, Zithromax and add vanc   - await results of blood and urine culture  - Rehydrate aggressively  Intravenous fluids were administered,          30cc/kg fluid    10:55 PM

## 2023-03-10 NOTE — ED Provider Notes (Signed)
[place] EMERGENCY DEPARTMENT AT [facility] Provider Note   CSN: [id number] Arrival date & time: 03/10/23  1735     History  Chief Complaint  Patient presents with   Chest Pain   Shortness of Breath    Emily Callahan is a 27 y.o. female.  27 year old female presents with complaint of low back pain, radiating down both legs. Reports onset of pain last Friday, resolved and returned today around 3:30pm today when she woke up. Also reports right side breast/axillary pain onset today. States she has had problems with her back off and on, generally treated with muscle relaxants, no reports of falls, no history of IVDA. Had 1 episode of vomiting earlier today. Febrile on arrival today, not previously aware of fever. No sick contacts, denies abdominal pain, loss of bowel or bladder control, cough.        Home Medications Prior to Admission medications   Medication Sig Start Date End Date Taking? Authorizing Provider  acetaminophen (TYLENOL) 500 MG tablet Take 1 tablet (500 mg total) by mouth every 6 (six) hours as needed. 07/31/22   Derwood Kaplan, MD  albuterol (VENTOLIN HFA) 108 (90 Base) MCG/ACT inhaler Inhale 2 puffs into the lungs every 4 (four) hours as needed for wheezing or shortness of breath.  12/14/19   [provider]  doxycycline (VIBRAMYCIN) 100 MG capsule Take 1 capsule (100 mg total) by mouth 2 (two) times daily. 11/26/22   Chrzanowski, Clearnce Hasten B, NP  metroNIDAZOLE (METROGEL) 0.75 % vaginal gel Insert one appful hs x 5 nights. 11/26/22   Chrzanowski, Jami B, NP  rizatriptan (MAXALT-MLT) 10 MG disintegrating tablet Take 1 tablet (10 mg total) by mouth as needed for migraine. May repeat in 2 hours if needed 02/06/22   Horton, Danford Bad M, DO  loratadine (CLARITIN) 10 MG tablet Take 1 tablet (10 mg total) by mouth daily. Patient not taking: Reported on 09/07/2018 10/28/16 12/25/20  Brock Bad, MD      Allergies    Patient has no known allergies.    Review of  Systems   Review of Systems Negative except as per HPI Physical Exam Updated Vital Signs BP 105/64 (BP Location: Left Arm)   Pulse 77   Temp 99 F (37.2 C) (Oral)   Resp 19   Ht 5\' 4"  (1.626 m)   Wt 69.9 kg   LMP 03/08/2023 Comment: neg preg test  SpO2 100%   BMI 26.45 kg/m  Physical Exam Vitals and nursing note reviewed.  Constitutional:      General: She is not in acute distress.    Appearance: She is well-developed. She is not diaphoretic.     Comments: tearful  HENT:     Head: Normocephalic and atraumatic.  Cardiovascular:     Rate and Rhythm: Regular rhythm. Tachycardia present.     Heart sounds: Normal heart sounds.  Pulmonary:     Effort: Pulmonary effort is normal.     Breath sounds: Normal breath sounds.  Chest:     Chest wall: Tenderness present.  Breasts:    Right: No skin change.    Abdominal:     Palpations: Abdomen is soft.     Tenderness: There is no abdominal tenderness. There is no right CVA tenderness or left CVA tenderness.  Musculoskeletal:     Cervical back: Neck supple.     Lumbar back: Tenderness present. No bony tenderness.       Back:  Skin:    General: Skin  is warm and dry.     Findings: No rash.  Neurological:     Mental Status: She is alert and oriented to person, place, and time.  Psychiatric:        Behavior: Behavior normal.     ED Results / Procedures / Treatments   Labs (all labs ordered are listed, but only abnormal results are displayed) Labs Reviewed  LACTIC ACID, PLASMA - Abnormal; Notable for the following components:      Result Value   Lactic Acid, Venous 2.2 (*)    All other components within normal limits  COMPREHENSIVE METABOLIC PANEL - Abnormal; Notable for the following components:   Sodium 133 (*)    Potassium 3.1 (*)    Glucose, Bld 109 (*)    Calcium 8.8 (*)    All other components within normal limits  CBC WITH DIFFERENTIAL/PLATELET - Abnormal; Notable for the following components:   WBC 20.4 (*)     Hemoglobin 11.1 (*)    MCH 25.1 (*)    Neutro Abs 18.4 (*)    Lymphs Abs 0.6 (*)    Monocytes Absolute 1.2 (*)    Abs Immature Granulocytes 0.14 (*)    All other components within normal limits  APTT - Abnormal; Notable for the following components:   aPTT 22 (*)    All other components within normal limits  RESP PANEL BY RT-PCR (RSV, FLU A&B, COVID)  RVPGX2  CULTURE, BLOOD (ROUTINE X 2)  CULTURE, BLOOD (ROUTINE X 2)  URINE CULTURE  EXPECTORATED SPUTUM ASSESSMENT W GRAM STAIN, RFLX TO RESP C  RESPIRATORY PANEL BY PCR  PROTIME-INR  URINALYSIS, ROUTINE W REFLEX MICROSCOPIC  HCG, SERUM, QUALITATIVE  LACTIC ACID, PLASMA  D-DIMER, QUANTITATIVE  HIV ANTIBODY (ROUTINE TESTING W REFLEX)  LEGIONELLA PNEUMOPHILA SEROGP 1 UR AG  STREP PNEUMONIAE URINARY ANTIGEN  CK  MAGNESIUM  PHOSPHORUS  PREALBUMIN  TSH  PROCALCITONIN  BLOOD GAS, VENOUS  SEDIMENTATION RATE  LACTATE DEHYDROGENASE  C-REACTIVE PROTEIN    EKG EKG Interpretation Date/Time:  Wednesday March 10 2023 17:46:56 EDT Ventricular Rate:  106 PR Interval:  154 QRS Duration:  87 QT Interval:  305 QTC Calculation: 405 R Axis:   80  Text Interpretation: Sinus tachycardia RSR' in V1 or V2, right VCD or RVH Borderline T abnormalities, inferior leads Confirmed by Vivi Barrack [phone]) on 03/10/2023 8:02:03 PM  Radiology CT ABDOMEN PELVIS W CONTRAST  Result Date: 03/10/2023 CLINICAL DATA:  Abdominal pain EXAM: CT ABDOMEN AND PELVIS WITH CONTRAST TECHNIQUE: Multidetector CT imaging of the abdomen and pelvis was performed using the standard protocol following bolus administration of intravenous contrast. RADIATION DOSE REDUCTION: This exam was performed according to the departmental dose-optimization program which includes automated exposure control, adjustment of the mA and/or kV according to patient size and/or use of iterative reconstruction technique. CONTRAST:  OMNIPAQUE IOHEXOL 300 MG/ML  SOLN COMPARISON:  CT abdomen and  pelvis with contrast December 30, 2021 FINDINGS: Lower chest: The visualized bibasilar lungs are clear. Normal heart size and no pericardial effusion. Hepatobiliary: No focal liver abnormality is seen. No gallstones, gallbladder wall thickening, or biliary dilatation. Pancreas: Unremarkable. No pancreatic ductal dilatation or surrounding inflammatory changes. Spleen: Normal in size without focal abnormality. Adrenals/Urinary Tract: Adrenal glands are unremarkable. Kidneys are normal, without renal calculi, focal lesion, or hydronephrosis. Bladder is unremarkable. Stomach/Bowel: Stomach is within normal limits. Appendix appears normal. No evidence of bowel wall thickening, distention, or inflammatory changes. Vascular/Lymphatic: No significant vascular findings are present. No enlarged  abdominal or pelvic lymph nodes. Reproductive: Uterus and bilateral adnexa are unremarkable. Other: No abdominal wall hernia or abnormality. No abdominopelvic ascites. Musculoskeletal: No acute or significant osseous findings. IMPRESSION: No etiology for abdominal pain is identified. Electronically Signed   By: Jacob Moores M.D.   On: 03/10/2023 21:25   DG Chest Port 1 View  Result Date: 03/10/2023 CLINICAL DATA:  Questionable sepsis-evaluate for abnormality. Upper right chest pain or shortness of breath EXAM: PORTABLE CHEST 1 VIEW COMPARISON:  None Available. FINDINGS: The heart size and mediastinal contours are within normal limits. Infiltrates and peribronchial wall thickening about the infrahilar right lung. Otherwise no focal consolidation, pleural effusion pneumothorax. The visualized skeletal structures are unremarkable. A turtle projects over the upper mid chest. IMPRESSION: Infiltrates and peribronchial wall thickening about the infrahilar right lung, likely infectious/inflammatory Electronically Signed   By: Minerva Fester M.D.   On: 03/10/2023 20:21    Procedures .Critical Care  Performed by: Jeannie Fend,  PA-C Authorized by: Jeannie Fend, PA-C   Critical care provider statement:    Critical care time (minutes):  30   Critical care was time spent personally by me on the following activities:  Development of treatment plan with patient or surrogate, discussions with consultants, evaluation of patient's response to treatment, examination of patient, ordering and review of laboratory studies, ordering and review of radiographic studies, ordering and performing treatments and interventions, pulse oximetry, re-evaluation of patient's condition and review of old charts     Medications Ordered in ED Medications  potassium chloride SA (KLOR-CON M) CR tablet 40 mEq (has no administration in time range)  lactated ringers infusion (has no administration in time range)  cefTRIAXone (ROCEPHIN) 2 g in sodium chloride 0.9 % 100 mL IVPB (has no administration in time range)  azithromycin (ZITHROMAX) 500 mg in sodium chloride 0.9 % 250 mL IVPB (has no administration in time range)  acetaminophen (TYLENOL) tablet 650 mg (650 mg Oral Given 03/10/23 1835)  sodium chloride 0.9 % bolus 1,000 mL (1,000 mLs Intravenous Bolus 03/10/23 1945)  ondansetron (ZOFRAN) injection 4 mg (4 mg Intravenous Given 03/10/23 1946)  morphine (PF) 4 MG/ML injection 4 mg (4 mg Intravenous Given 03/10/23 1948)  sodium chloride 0.9 % bolus 1,000 mL (1,000 mLs Intravenous Bolus 03/10/23 2031)  cefTRIAXone (ROCEPHIN) 1 g in sodium chloride 0.9 % 100 mL IVPB (0 g Intravenous Stopped 03/10/23 2121)  azithromycin (ZITHROMAX) tablet 500 mg (500 mg Oral Given 03/10/23 2038)  iohexol (OMNIPAQUE) 300 MG/ML solution 100 mL (100 mLs Intravenous Contrast Given 03/10/23 2109)    ED Course/ Medical Decision Making/ A&P                             Medical Decision Making Amount and/or Complexity of Data Reviewed Labs: ordered. Radiology: ordered. ECG/medicine tests: ordered.  Risk Prescription drug management.   This patient presents to the ED for  concern of fever, right chest wall pain, low back pain, this involves an extensive number of treatment options, and is a complaint that carries with it a high risk of complications and morbidity.  The differential diagnosis includes but not limited to viral illness, pneumonia, UTI/pyelonephritis, sepsis, epidural abscess    Co morbidities that complicate the patient evaluation  asthma   Additional history obtained:  External records from outside source obtained and reviewed including prior labs on file. Visit dated 05/2020 for sore throat with chest and abdominal pain with WBC  19k, likely viral illness   Lab Tests:  I Ordered, and personally interpreted labs.  The pertinent results include: CBC with white count elevated at 20.4 with left shift.  CMP with mild hypokalemia with potassium 3.1, normal renal and hepatic function.  Lactic acid is elevated at 2.2.  Viral swab negative for COVID, flu, RSV.  Urinalysis is normal.   Imaging Studies ordered:  I ordered imaging studies including chest x-ray, CT abdomen pelvis with contrast I independently visualized and interpreted imaging which showed CT abdomen pelvis without significant findings.  Chest x-ray concerning for right-sided pneumonia I agree with the radiologist interpretation   Cardiac Monitoring: / EKG:  The patient was maintained on a cardiac monitor.  I personally viewed and interpreted the cardiac monitored which showed an underlying rhythm of: Sinus tachycardia, rate 106   Consultations Obtained:  I requested consultation with the Dr. Adela Glimpse, [facility],  and discussed lab and imaging findings as well as pertinent plan - they recommend: will see patient for admission  Case discussed with Dr. Jearld Fenton, ER attending, recommends CT a/p.    Problem List / ED Course / Critical interventions / Medication management  27 year old female presents with complaint of right chest wall pain and lower back pain, onset today.  Arrives febrile with temp of 102.4, improved with Tylenol. Right chest TTP, left lower back tenderness without midline tenderness, leg strength symmetric. CXR with concern for PNA (although not seen on abdominal CT). CT a/p normal. Labs concerning for leukocytosis with left shift, elevated lactic acid. Provided with abx and fluids. Discussed results with patient. Discussed with hospitalist who will consult for admission.  I ordered medication including Rocephin, Zithromax, Tylenol, IV fluids, morphine, Zofran for pneumonia, pain, fever Reevaluation of the patient after these medicines showed that the patient improved I have reviewed the patients home medicines and have made adjustments as needed   Social Determinants of Health:  Lives with family   Test / Admission - Considered:  Admit         Final Clinical Impression(s) / ED Diagnoses Final diagnoses:  Pneumonia due to infectious organism, unspecified laterality, unspecified part of lung  Sepsis without acute organ dysfunction, due to unspecified organism [facility])  Hypokalemia    Rx / DC Orders ED Discharge Orders     None         Jeannie Fend, PA-C 03/10/23 2200    Loetta Rough, MD 03/11/23 2240

## 2023-03-10 NOTE — Progress Notes (Signed)
Pharmacy Antibiotic Note  Emily Callahan is a 27 y.o. female admitted on 03/10/2023 with sepsis/pneumonia/cellulitis.  CXR - CAP. Patient reports breast tenderness/redness.  Pharmacy has been consulted for Vancomycin dosing.  Plan: Vancomycin 1500mg  IV x 1 followed by Vancomycin 1000 mg IV Q 12 hrs. Goal AUC 400-600. Expected AUC: 569.5 SCr used: 0.91 Ceftriaxone/Azithromycin per MD Follow renal function F/u culture results and sensitivities  Height: 5\' 4"  (162.6 cm) Weight: 69.9 kg (154 lb 1.6 oz) IBW/kg (Calculated) : 54.7  Temp (24hrs), Avg:100.7 F (38.2 C), Min:99 F (37.2 C), Max:102.4 F (39.1 C)  Recent Labs  Lab 03/10/23 1903 03/10/23 1950  WBC 20.4*  --   CREATININE 0.91  --   LATICACIDVEN  --  2.2*    Estimated Creatinine Clearance: 87.6 mL/min (by C-G formula based on SCr of 0.91 mg/dL).    No Known Allergies  Antimicrobials this admission: 7/3 Ceftriaxone >>   7/3 Azithromycin >>   7/3 Vancomycin >>  Dose adjustments this admission:    Microbiology results: 7/3 BCx:   7/3 UCx:       Thank you for allowing pharmacy to be a part of this patient's care.  Maryellen Pile, PharmD 03/10/2023 11:20 PM

## 2023-03-10 NOTE — ED Notes (Signed)
ED TO INPATIENT HANDOFF REPORT  S Name/Age/Gender Emily Callahan 27 y.o. female Room/Bed: WA13/WA13  Code Status   Code Status: Full Code  Home/SNF/Other Home Patient oriented to: self, place, time, and situation Is this baseline? Yes   Triage Complete: Triage complete  Chief Complaint Sepsis [facility]) [A41.9]  Triage Note Pt report right side breast pain and lower back pain that radiates down both legs. Denies injury. MAE. Denies any urinary changes. Reports intermittent SOB that also started earlier today. Patient AAOX4, resp even and unlabored. NAD. Speaking in full sentences   Allergies No Known Allergies  Level of Care/Admitting Diagnosis ED Disposition     ED Disposition  Admit   Condition  --   Comment  Hospital Area: [facility] [[id number]]  Level of Care: Progressive [102]  Admit to Progressive based on following criteria: MULTISYSTEM THREATS such as stable sepsis, metabolic/electrolyte imbalance with or without encephalopathy that is responding to early treatment.  May admit patient to [facility] or [facility] if equivalent level of care is available:: No  Covid Evaluation: Confirmed COVID Negative  Diagnosis: Sepsis [facility]) [[id number]]  Admitting Physician: Therisa Doyne [[id number]]  Attending Physician: Therisa Doyne [[id number]]  Certification:: I certify this patient will need inpatient services for at least 2 midnights  Estimated Length of Stay: 2          B Medical/Surgery History Past Medical History:  Diagnosis Date   Asthma    IBS (irritable bowel syndrome)    Migraine    Pregnant    Past Surgical History:  Procedure Laterality Date   MOUTH SURGERY     Wisdom teeth "pulled"    WISDOM TOOTH EXTRACTION       A IV Location/Drains/Wounds Patient Lines/Drains/Airways Status     Active Line/Drains/Airways     Name Placement date Placement time Site Days   Peripheral IV 03/10/23 20 G Right Antecubital 03/10/23  1945   Antecubital  less than 1   Epidural Catheter 04/06/17 04/06/17  1929  -- 2164            Intake/Output Last 24 hours  Intake/Output Summary (Last 24 hours) at 03/10/2023 2337 Last data filed at 03/10/2023 2121 Gross per 24 hour  Intake 100.42 ml  Output --  Net 100.42 ml    Labs/Imaging Results for orders placed or performed during the hospital encounter of 03/10/23 (from the past 48 hour(s))  Urinalysis, Routine w reflex microscopic -Urine, Clean Catch     Status: None   Collection Time: 03/10/23  7:01 PM  Result Value Ref Range   Color, Urine YELLOW YELLOW   APPearance CLEAR CLEAR   Specific Gravity, Urine 1.029 1.005 - 1.030   pH 5.0 5.0 - 8.0   Glucose, UA NEGATIVE NEGATIVE mg/dL   Hgb urine dipstick NEGATIVE NEGATIVE   Bilirubin Urine NEGATIVE NEGATIVE   Ketones, ur NEGATIVE NEGATIVE mg/dL   Protein, ur NEGATIVE NEGATIVE mg/dL   Nitrite NEGATIVE NEGATIVE   Leukocytes,Ua NEGATIVE NEGATIVE    Comment: Performed at [facility], [address] [place], [place] [address]  Resp panel by RT-PCR (RSV, Flu A&B, Covid) Urine, Clean Catch     Status: None   Collection Time: 03/10/23  7:02 PM   Specimen: Urine, Clean Catch; Nasal Swab  Result Value Ref Range   SARS Coronavirus 2 by RT PCR NEGATIVE NEGATIVE    Comment: (NOTE) SARS-CoV-2 target nucleic acids are NOT DETECTED.  The SARS-CoV-2 RNA is generally detectable in upper  respiratory specimens during the acute phase of infection. The lowest concentration of SARS-CoV-2 viral copies this assay can detect is 138 copies/mL. A negative result does not preclude SARS-Cov-2 infection and should not be used as the sole basis for treatment or other patient management decisions. A negative result may occur with  improper specimen collection/handling, submission of specimen other than nasopharyngeal swab, presence of viral mutation(s) within the areas targeted by this assay, and inadequate number of  viral copies(<138 copies/mL). A negative result must be combined with clinical observations, patient history, and epidemiological information. The expected result is Negative.  Fact Sheet for Patients:  BloggerCourse.com  Fact Sheet for Healthcare Providers:  SeriousBroker.it  This test is no t yet approved or cleared by the [place] FDA and  has been authorized for detection and/or diagnosis of SARS-CoV-2 by FDA under an Emergency Use Authorization (EUA). This EUA will remain  in effect (meaning this test can be used) for the duration of the COVID-19 declaration under Section 564(b)(1) of the Act, 21 U.S.C.section 360bbb-3(b)(1), unless the authorization is terminated  or revoked sooner.       Influenza A by PCR NEGATIVE NEGATIVE   Influenza B by PCR NEGATIVE NEGATIVE    Comment: (NOTE) The Xpert Xpress SARS-CoV-2/FLU/RSV plus assay is intended as an aid in the diagnosis of influenza from Nasopharyngeal swab specimens and should not be used as a sole basis for treatment. Nasal washings and aspirates are unacceptable for Xpert Xpress SARS-CoV-2/FLU/RSV testing.  Fact Sheet for Patients: BloggerCourse.com  Fact Sheet for Healthcare Providers: SeriousBroker.it  This test is not yet approved or cleared by the [place] FDA and has been authorized for detection and/or diagnosis of SARS-CoV-2 by FDA under an Emergency Use Authorization (EUA). This EUA will remain in effect (meaning this test can be used) for the duration of the COVID-19 declaration under Section 564(b)(1) of the Act, 21 U.S.C. section 360bbb-3(b)(1), unless the authorization is terminated or revoked.     Resp Syncytial Virus by PCR NEGATIVE NEGATIVE    Comment: (NOTE) Fact Sheet for Patients: BloggerCourse.com  Fact Sheet for Healthcare  Providers: SeriousBroker.it  This test is not yet approved or cleared by the [place] FDA and has been authorized for detection and/or diagnosis of SARS-CoV-2 by FDA under an Emergency Use Authorization (EUA). This EUA will remain in effect (meaning this test can be used) for the duration of the COVID-19 declaration under Section 564(b)(1) of the Act, 21 U.S.C. section 360bbb-3(b)(1), unless the authorization is terminated or revoked.  Performed at [facility], [address], [place], [place] [address]   Comprehensive metabolic panel     Status: Abnormal   Collection Time: 03/10/23  7:03 PM  Result Value Ref Range   Sodium 133 (L) 135 - 145 mmol/L   Potassium 3.1 (L) 3.5 - 5.1 mmol/L   Chloride 100 98 - 111 mmol/L   CO2 23 22 - 32 mmol/L   Glucose, Bld 109 (H) 70 - 99 mg/dL    Comment: Glucose reference range applies only to samples taken after fasting for at least 8 hours.   BUN 8 6 - 20 mg/dL   Creatinine, Ser 9.14 0.44 - 1.00 mg/dL   Calcium 8.8 (L) 8.9 - 10.3 mg/dL   Total Protein 7.8 6.5 - 8.1 g/dL   Albumin 3.5 3.5 - 5.0 g/dL   AST 29 15 - 41 U/L   ALT 21 0 - 44 U/L   Alkaline Phosphatase 43 38 -  126 U/L   Total Bilirubin 0.7 0.3 - 1.2 mg/dL   GFR, Estimated >16 >10 mL/min    Comment: (NOTE) Calculated using the CKD-EPI Creatinine Equation (2021)    Anion gap 10 5 - 15    Comment: Performed at [facility], [address], [place], [place] [address]  CBC with Differential     Status: Abnormal   Collection Time: 03/10/23  7:03 PM  Result Value Ref Range   WBC 20.4 (H) 4.0 - 10.5 K/uL   RBC 4.43 3.87 - 5.11 MIL/uL   Hemoglobin 11.1 (L) 12.0 - 15.0 g/dL   HCT 40.9 81.1 - 91.4 %   MCV 83.1 80.0 - 100.0 fL   MCH 25.1 (L) 26.0 - 34.0 pg   MCHC 30.2 30.0 - 36.0 g/dL   RDW 78.2 95.6 - 21.3 %   Platelets 270 150 - 400 K/uL   nRBC 0.0 0.0 - 0.2 %   Neutrophils Relative % 90 %   Neutro Abs 18.4 (H)  1.7 - 7.7 K/uL   Lymphocytes Relative 3 %   Lymphs Abs 0.6 (L) 0.7 - 4.0 K/uL   Monocytes Relative 6 %   Monocytes Absolute 1.2 (H) 0.1 - 1.0 K/uL   Eosinophils Relative 0 %   Eosinophils Absolute 0.0 0.0 - 0.5 K/uL   Basophils Relative 0 %   Basophils Absolute 0.0 0.0 - 0.1 K/uL   Immature Granulocytes 1 %   Abs Immature Granulocytes 0.14 (H) 0.00 - 0.07 K/uL    Comment: Performed at [facility], [address], [place], [place] [address]  Protime-INR     Status: None   Collection Time: 03/10/23  7:03 PM  Result Value Ref Range   Prothrombin Time 12.8 11.4 - 15.2 seconds   INR 1.0 0.8 - 1.2    Comment: (NOTE) INR goal varies based on device and disease states. Performed at [facility], [address] [place], [place] [address]   APTT     Status: Abnormal   Collection Time: 03/10/23  7:03 PM  Result Value Ref Range   aPTT 22 (L) 24 - 36 seconds    Comment: Performed at [facility], [address], [place], [place] [address]  hCG, serum, qualitative     Status: None   Collection Time: 03/10/23  7:03 PM  Result Value Ref Range   Preg, Serum NEGATIVE NEGATIVE    Comment:        THE SENSITIVITY OF THIS METHODOLOGY IS >10 mIU/mL. Performed at [facility], [address] [place], [place] [address]   D-dimer, quantitative     Status: Abnormal   Collection Time: 03/10/23  7:03 PM  Result Value Ref Range   D-Dimer, Quant 1.37 (H) 0.00 - 0.50 ug/mL-FEU    Comment: (NOTE) At the manufacturer cut-off value of 0.5 g/mL FEU, this assay has a negative predictive value of 95-100%.This assay is intended for use in conjunction with a clinical pretest probability (PTP) assessment model to exclude pulmonary embolism (PE) and deep venous thrombosis (DVT) in outpatients suspected of PE or DVT. Results should be correlated with clinical presentation. Performed at [facility], [address] [place], [place] [address]   CK     Status: None   Collection Time: 03/10/23  7:03 PM  Result Value Ref Range   Total CK 72 38 - 234 U/L    Comment: Performed at [facility], [address] [place], [place] [address]  Magnesium     Status: None   Collection Time: 03/10/23  7:03 PM  Result Value Ref Range   Magnesium 1.8 1.7 - 2.4 mg/dL    Comment: Performed at [facility], [address], [place], [place] [address]  Phosphorus     Status: Abnormal   Collection Time: 03/10/23  7:03 PM  Result Value Ref Range   Phosphorus 1.8 (L) 2.5 - 4.6 mg/dL    Comment: Performed at [facility], [address] [place], [place] [address]  Procalcitonin     Status: None   Collection Time: 03/10/23  7:03 PM  Result Value Ref Range   Procalcitonin 0.41 ng/mL    Comment:        Interpretation: PCT (Procalcitonin) <= 0.5 ng/mL: Systemic infection (sepsis) is not likely. Local bacterial infection is possible. (NOTE)       Sepsis PCT Algorithm           Lower Respiratory Tract                                      Infection PCT Algorithm    ----------------------------     ----------------------------         PCT < 0.25 ng/mL                PCT < 0.10 ng/mL          Strongly encourage             Strongly discourage   discontinuation of antibiotics    initiation of antibiotics    ----------------------------     -----------------------------       PCT 0.25 - 0.50 ng/mL            PCT 0.10 - 0.25 ng/mL               OR       >80% decrease in PCT            Discourage initiation of                                            antibiotics      Encourage discontinuation           of antibiotics    ----------------------------     -----------------------------         PCT >= 0.50 ng/mL              PCT 0.26 - 0.50 ng/mL               AND        <80% decrease in PCT             Encourage initiation of                                              antibiotics       Encourage continuation           of antibiotics    ----------------------------     -----------------------------        PCT >= 0.50 ng/mL  PCT > 0.50 ng/mL               AND         increase in PCT                  Strongly encourage                                      initiation of antibiotics    Strongly encourage escalation           of antibiotics                                     -----------------------------                                           PCT <= 0.25 ng/mL                                                 OR                                        > 80% decrease in PCT                                      Discontinue / Do not initiate                                             antibiotics  Performed at [facility], [address], [place], [place] [address]   Lactate dehydrogenase     Status: None   Collection Time: 03/10/23  7:03 PM  Result Value Ref Range   LDH 141 98 - 192 U/L    Comment: Performed at [facility], [address], [place], [place] [address]  Lactic acid, plasma     Status: Abnormal   Collection Time: 03/10/23  7:50 PM  Result Value Ref Range   Lactic Acid, Venous 2.2 (HH) 0.5 - 1.9 mmol/L    Comment: CRITICAL RESULT CALLED TO, READ BACK BY AND VERIFIED WITH Maylene Crocker,J RN @ 2106 03/10/23 BY CHILDRESS,E Performed at [facility], [address], [place], [place] [address]   Lactic acid, plasma     Status: None   Collection Time: 03/10/23 10:31 PM  Result Value Ref Range   Lactic Acid, Venous 1.3 0.5 - 1.9 mmol/L    Comment: Performed at [facility], [address] [place], [place] [address]  Rapid urine drug screen (hospital performed)     Status: Abnormal   Collection Time: 03/10/23 10:31 PM  Result Value Ref Range   Opiates POSITIVE (A) NONE DETECTED   Cocaine NONE DETECTED NONE DETECTED   Benzodiazepines NONE DETECTED  NONE DETECTED   Amphetamines  NONE DETECTED NONE DETECTED   Tetrahydrocannabinol POSITIVE (A) NONE DETECTED   Barbiturates NONE DETECTED NONE DETECTED    Comment: (NOTE) DRUG SCREEN FOR MEDICAL PURPOSES ONLY.  IF CONFIRMATION IS NEEDED FOR ANY PURPOSE, NOTIFY LAB WITHIN 5 DAYS.  LOWEST DETECTABLE LIMITS FOR URINE DRUG SCREEN Drug Class                     Cutoff (ng/mL) Amphetamine and metabolites    1000 Barbiturate and metabolites    200 Benzodiazepine                 200 Opiates and metabolites        300 Cocaine and metabolites        300 THC                            50 Performed at [facility], [address] [place], [place] [address]    CT Angio Chest Pulmonary Embolism (PE) W or WO Contrast  Result Date: 03/10/2023 CLINICAL DATA:  Right-sided chest pain with elevated D-dimer, initial encounter EXAM: CT ANGIOGRAPHY CHEST WITH CONTRAST TECHNIQUE: Multidetector CT imaging of the chest was performed using the standard protocol during bolus administration of intravenous contrast. Multiplanar CT image reconstructions and MIPs were obtained to evaluate the vascular anatomy. RADIATION DOSE REDUCTION: This exam was performed according to the departmental dose-optimization program which includes automated exposure control, adjustment of the mA and/or kV according to patient size and/or use of iterative reconstruction technique. CONTRAST:  60mL OMNIPAQUE IOHEXOL 350 MG/ML SOLN COMPARISON:  Chest x-ray from the previous day. FINDINGS: Cardiovascular: Thoracic aorta shows a normal branching pattern bilaterally. No aneurysmal dilatation or dissection is noted. The heart is within normal limits in size. No pericardial effusion is noted. The pulmonary artery shows a normal branching pattern bilaterally. No filling defect to suggest pulmonary embolism is noted. Mediastinum/Nodes: Thoracic inlet is within normal limits. No hilar or mediastinal adenopathy is noted. The esophagus as  visualized is normal limits. Mildly prominent axillary lymph nodes are noted on the right when compared with the left side. No sizable parenchymal nodule is noted. Lungs/Pleura: Lungs are well aerated bilaterally. No focal infiltrate or sizable effusion is seen. Upper Abdomen: Visualized upper abdomen shows no acute abnormality. Musculoskeletal: No acute bony abnormality is noted. The right breast is mildly enlarged compared to the left although no skin thickening or focal abscess is seen. Review of the MIP images confirms the above findings. IMPRESSION: Mild prominence of the right breast when compared with the left although no discrete abscess is seen. Some mildly prominent reactive axillary nodes are noted on the right. Correlate with any cutaneous etiology. No evidence of pulmonary emboli. No other focal abnormality is noted. Electronically Signed   By: Alcide Clever M.D.   On: 03/10/2023 23:10   CT ABDOMEN PELVIS W CONTRAST  Result Date: 03/10/2023 CLINICAL DATA:  Abdominal pain EXAM: CT ABDOMEN AND PELVIS WITH CONTRAST TECHNIQUE: Multidetector CT imaging of the abdomen and pelvis was performed using the standard protocol following bolus administration of intravenous contrast. RADIATION DOSE REDUCTION: This exam was performed according to the departmental dose-optimization program which includes automated exposure control, adjustment of the mA and/or kV according to patient size and/or use of iterative reconstruction technique. CONTRAST:  OMNIPAQUE IOHEXOL 300 MG/ML  SOLN COMPARISON:  CT abdomen and pelvis with contrast December 30, 2021 FINDINGS: Lower chest: The visualized bibasilar lungs  are clear. Normal heart size and no pericardial effusion. Hepatobiliary: No focal liver abnormality is seen. No gallstones, gallbladder wall thickening, or biliary dilatation. Pancreas: Unremarkable. No pancreatic ductal dilatation or surrounding inflammatory changes. Spleen: Normal in size without focal abnormality.  Adrenals/Urinary Tract: Adrenal glands are unremarkable. Kidneys are normal, without renal calculi, focal lesion, or hydronephrosis. Bladder is unremarkable. Stomach/Bowel: Stomach is within normal limits. Appendix appears normal. No evidence of bowel wall thickening, distention, or inflammatory changes. Vascular/Lymphatic: No significant vascular findings are present. No enlarged abdominal or pelvic lymph nodes. Reproductive: Uterus and bilateral adnexa are unremarkable. Other: No abdominal wall hernia or abnormality. No abdominopelvic ascites. Musculoskeletal: No acute or significant osseous findings. IMPRESSION: No etiology for abdominal pain is identified. Electronically Signed   By: Jacob Moores M.D.   On: 03/10/2023 21:25   DG Chest Port 1 View  Result Date: 03/10/2023 CLINICAL DATA:  Questionable sepsis-evaluate for abnormality. Upper right chest pain or shortness of breath EXAM: PORTABLE CHEST 1 VIEW COMPARISON:  None Available. FINDINGS: The heart size and mediastinal contours are within normal limits. Infiltrates and peribronchial wall thickening about the infrahilar right lung. Otherwise no focal consolidation, pleural effusion pneumothorax. The visualized skeletal structures are unremarkable. A turtle projects over the upper mid chest. IMPRESSION: Infiltrates and peribronchial wall thickening about the infrahilar right lung, likely infectious/inflammatory Electronically Signed   By: Minerva Fester M.D.   On: 03/10/2023 20:21    Pending Labs Unresulted Labs (From admission, onward)     Start     Ordered   03/11/23 0500  Prealbumin  Tomorrow morning,   R        03/10/23 2156   03/10/23 2230  HIV Antibody (routine testing w rflx)  Once,   R        03/10/23 2230   03/10/23 2201  C-reactive protein  Add-on,   AD        03/10/23 2200   03/10/23 2200  Blood gas, venous  Once,   R        03/10/23 2159   03/10/23 2200  Sedimentation rate  Add-on,   AD        03/10/23 2200   03/10/23 2157   Respiratory (~20 pathogens) panel by PCR  (Respiratory panel by PCR (~20 pathogens, ~24 hr TAT)  w precautions)  Once,   URGENT       Question Answer Comment  Patient immune status Normal   Release to patient Immediate      03/10/23 2156   03/10/23 2157  TSH  Add-on,   AD        03/10/23 2156   03/10/23 2155  Legionella Pneumophila Serogp 1 Ur Ag  Once,   URGENT        03/10/23 2156   03/10/23 2155  Strep pneumoniae urinary antigen  Once,   URGENT        03/10/23 2156   03/10/23 2154  Expectorated Sputum Assessment w Gram Stain, Rflx to Resp Cult  Once,   R       Question:  Patient immune status  Answer:  Immunocompromised   03/10/23 2156   03/10/23 1827  Blood Culture (routine x 2)  (Undifferentiated presentation (screening labs and basic nursing orders))  BLOOD CULTURE X 2,   STAT      03/10/23 1827   03/10/23 1827  Urine Culture (for pregnant, neutropenic or urologic patients or patients with an indwelling urinary catheter)  (Undifferentiated presentation (screening labs and basic nursing  orders))  Once,   URGENT       Question:  Indication  Answer:  Sepsis   03/10/23 1827   Signed and Held  Magnesium  Tomorrow morning,   R        Signed and Held   Signed and Held  Phosphorus  Tomorrow morning,   R        Signed and Held   Signed and Held  Comprehensive metabolic panel  Tomorrow morning,   R       Question:  Release to patient  Answer:  Immediate   Signed and Held   Signed and Held  CBC  Tomorrow morning,   R       Question:  Release to patient  Answer:  Immediate   Signed and Held            Vitals/Pain Today's Vitals   03/10/23 1800 03/10/23 1845 03/10/23 1905 03/10/23 2029  BP: 105/66 109/61 111/67 105/64  Pulse: (!) 107 99 91 77  Resp: 13 20 (!) 22 19  Temp:    99 F (37.2 C)  TempSrc:    Oral  SpO2: 98% 100% 100% 100%  Weight:      Height:      PainSc:    5     Isolation Precautions Droplet precaution  Medications Medications  lactated ringers infusion  (has no administration in time range)  cefTRIAXone (ROCEPHIN) 2 g in sodium chloride 0.9 % 100 mL IVPB (has no administration in time range)  azithromycin (ZITHROMAX) 500 mg in sodium chloride 0.9 % 250 mL IVPB (has no administration in time range)  potassium chloride 10 mEq in 100 mL IVPB (has no administration in time range)  vancomycin (VANCOREADY) IVPB 1500 mg/300 mL (has no administration in time range)  potassium PHOSPHATE 15 mmol in dextrose 5 % 250 mL infusion (has no administration in time range)  magnesium sulfate IVPB 1 g 100 mL (has no administration in time range)  acetaminophen (TYLENOL) tablet 650 mg (650 mg Oral Given 03/10/23 1835)  sodium chloride 0.9 % bolus 1,000 mL (1,000 mLs Intravenous Bolus 03/10/23 1945)  ondansetron (ZOFRAN) injection 4 mg (4 mg Intravenous Given 03/10/23 1946)  morphine (PF) 4 MG/ML injection 4 mg (4 mg Intravenous Given 03/10/23 1948)  sodium chloride 0.9 % bolus 1,000 mL (1,000 mLs Intravenous Bolus 03/10/23 2031)  cefTRIAXone (ROCEPHIN) 1 g in sodium chloride 0.9 % 100 mL IVPB (0 g Intravenous Stopped 03/10/23 2121)  azithromycin (ZITHROMAX) tablet 500 mg (500 mg Oral Given 03/10/23 2038)  iohexol (OMNIPAQUE) 300 MG/ML solution 100 mL (100 mLs Intravenous Contrast Given 03/10/23 2109)  potassium chloride SA (KLOR-CON M) CR tablet 40 mEq (40 mEq Oral Given 03/10/23 2231)  iohexol (OMNIPAQUE) 350 MG/ML injection 75 mL (60 mLs Intravenous Contrast Given 03/10/23 2251)    Mobility walks     Focused Assessments   R Recommendations: See Admitting Provider Note  Report given to: Lacie Draft

## 2023-03-10 NOTE — ED Triage Notes (Signed)
Pt report right side breast pain and lower back pain that radiates down both legs. Denies injury. MAE. Denies any urinary changes. Reports intermittent SOB that also started earlier today. Patient AAOX4, resp even and unlabored. NAD. Speaking in full sentences

## 2023-03-10 NOTE — H&P (Signed)
Emily Callahan PIR:[id number] DOB: 1995/12/04 DOA: 03/10/2023     PCP: Pcp, No     Patient arrived to ER on 03/10/23 at 1735 Referred by Attending Loetta Rough, MD   Patient coming from:    home Lives   With family    Chief Complaint:   Chief Complaint  Patient presents with   Chest Pain   Shortness of Breath    HPI: Emily Callahan is a 27 y.o. female with medical history significant of Asthma, anemia    Presented with  fever, chills rigth side chest pain Right side rib/breast back pain fever 102.4 Some SOB No IV DU Some left lower back tenderness  Denies may with inspiration reports pain when she touches the right side or lifts up her arm     On occasions ETOH   Does smoke marijuana every day  Lab Results  Component Value Date   SARSCOV2NAA NEGATIVE 03/10/2023   SARSCOV2NAA NEGATIVE 05/27/2022   SARSCOV2NAA NEGATIVE 02/06/2022   SARSCOV2NAA NEGATIVE 05/08/2020        Regarding pertinent Chronic problems:     Asthma -well   controlled on home inhalers/ nebs                        Chronic anemia - baseline hg Hemoglobin & Hematocrit  Recent Labs    05/27/22 0757 03/10/23 1903  HGB 11.7* 11.1*     While in ER:     Lab Orders         Blood Culture (routine x 2)         Urine Culture (for pregnant, neutropenic or urologic patients or patients with an indwelling urinary catheter)         Resp panel by RT-PCR (RSV, Flu A&B, Covid) Anterior Nasal Swab         Lactic acid, plasma         Comprehensive metabolic panel         CBC with Differential         Protime-INR         APTT         Urinalysis, Routine w reflex microscopic -Urine, Clean Catch         hCG, serum, qualitative       CXR - CAP  CTabd/pelvis -  nonacute    Following Medications were ordered in ER: Medications  potassium chloride SA (KLOR-CON M) CR tablet 40 mEq (has no administration in time range)  lactated ringers infusion (has no administration in time range)  acetaminophen  (TYLENOL) tablet 650 mg (650 mg Oral Given 03/10/23 1835)  sodium chloride 0.9 % bolus 1,000 mL (1,000 mLs Intravenous Bolus 03/10/23 1945)  ondansetron (ZOFRAN) injection 4 mg (4 mg Intravenous Given 03/10/23 1946)  morphine (PF) 4 MG/ML injection 4 mg (4 mg Intravenous Given 03/10/23 1948)  sodium chloride 0.9 % bolus 1,000 mL (1,000 mLs Intravenous Bolus 03/10/23 2031)  cefTRIAXone (ROCEPHIN) 1 g in sodium chloride 0.9 % 100 mL IVPB (0 g Intravenous Stopped 03/10/23 2121)  azithromycin (ZITHROMAX) tablet 500 mg (500 mg Oral Given 03/10/23 2038)  iohexol (OMNIPAQUE) 300 MG/ML solution 100 mL (100 mLs Intravenous Contrast Given 03/10/23 2109)       ED Triage Vitals  Enc Vitals Group     BP 03/10/23 1746 110/67     Pulse Rate 03/10/23 1746 (!) 108     Resp 03/10/23 1746 15     Temp 03/10/23  1746 (!) 102.4 F (39.1 C)     Temp Source 03/10/23 2029 Oral     SpO2 03/10/23 1746 97 %     Weight 03/10/23 1741 154 lb 1.6 oz (69.9 kg)     Height 03/10/23 1741 5\' 4"  (1.626 m)     Head Circumference --      Peak Flow --      Pain Score 03/10/23 2029 5     Pain Loc --      Pain Edu? --      Excl. in GC? --   TMAX(24)@     _________________________________________ Significant initial  Findings: Abnormal Labs Reviewed  LACTIC ACID, PLASMA - Abnormal; Notable for the following components:      Result Value   Lactic Acid, Venous 2.2 (*)    All other components within normal limits  COMPREHENSIVE METABOLIC PANEL - Abnormal; Notable for the following components:   Sodium 133 (*)    Potassium 3.1 (*)    Glucose, Bld 109 (*)    Calcium 8.8 (*)    All other components within normal limits  CBC WITH DIFFERENTIAL/PLATELET - Abnormal; Notable for the following components:   WBC 20.4 (*)    Hemoglobin 11.1 (*)    MCH 25.1 (*)    Neutro Abs 18.4 (*)    Lymphs Abs 0.6 (*)    Monocytes Absolute 1.2 (*)    Abs Immature Granulocytes 0.14 (*)    All other components within normal limits  APTT - Abnormal; Notable  for the following components:   aPTT 22 (*)    All other components within normal limits       Cardiac Panel (last 3 results) Recent Labs    03/10/23 1903  CKTOTAL 72     ECG: Ordered Personally reviewed and interpreted by me showing: HR : 106 Rhythm:  Sinus tachycardia RSR' in V1 or V2, right VCD or RVH Borderline T abnormalities, inferior leads QTC 405     COVID-19 Labs  No results for input(s): "DDIMER", "FERRITIN", "LDH", "CRP" in the last 72 hours.  Lab Results  Component Value Date   SARSCOV2NAA NEGATIVE 03/10/2023   SARSCOV2NAA NEGATIVE 05/27/2022   SARSCOV2NAA NEGATIVE 02/06/2022   SARSCOV2NAA NEGATIVE 05/08/2020    ____________________ This patient meets SIRS Criteria and may be septic.    The recent clinical data is shown below. Vitals:   03/10/23 1800 03/10/23 1845 03/10/23 1905 03/10/23 2029  BP: 105/66 109/61 111/67 105/64  Pulse: (!) 107 99 91 77  Resp: 13 20 (!) 22 19  Temp:    99 F (37.2 C)  TempSrc:    Oral  SpO2: 98% 100% 100% 100%  Weight:      Height:        WBC     Component Value Date/Time   WBC 20.4 (H) 03/10/2023 1903   LYMPHSABS 0.6 (L) 03/10/2023 1903   LYMPHSABS 2.3 09/14/2016 1037   MONOABS 1.2 (H) 03/10/2023 1903   EOSABS 0.0 03/10/2023 1903   EOSABS 0.1 09/14/2016 1037   BASOSABS 0.0 03/10/2023 1903   BASOSABS 0.0 09/14/2016 1037     Lactic Acid, Venous    Component Value Date/Time   LATICACIDVEN 2.2 (HH) 03/10/2023 1950      Lactic Acid, Venous    Component Value Date/Time   LATICACIDVEN 2.2 (HH) 03/10/2023 1950    Procalcitonin   Ordered      UA   no evidence of UTI      Urine analysis:  Component Value Date/Time   COLORURINE YELLOW 03/10/2023 1901   APPEARANCEUR CLEAR 03/10/2023 1901   LABSPEC 1.029 03/10/2023 1901   PHURINE 5.0 03/10/2023 1901   GLUCOSEU NEGATIVE 03/10/2023 1901   HGBUR NEGATIVE 03/10/2023 1901   BILIRUBINUR NEGATIVE 03/10/2023 1901   KETONESUR NEGATIVE 03/10/2023 1901    PROTEINUR NEGATIVE 03/10/2023 1901   UROBILINOGEN 0.2 10/09/2021 0941   NITRITE NEGATIVE 03/10/2023 1901   LEUKOCYTESUR NEGATIVE 03/10/2023 1901    Results for orders placed or performed during the hospital encounter of 03/10/23  Resp panel by RT-PCR (RSV, Flu A&B, Covid) Urine, Clean Catch     Status: None   Collection Time: 03/10/23  7:02 PM   Specimen: Urine, Clean Catch; Nasal Swab  Result Value Ref Range Status   SARS Coronavirus 2 by RT PCR NEGATIVE NEGATIVE Final         Influenza A by PCR NEGATIVE NEGATIVE Final   Influenza B by PCR NEGATIVE NEGATIVE Final         Resp Syncytial Virus by PCR NEGATIVE NEGATIVE Final          ABX started Antibiotics Given (last 72 hours)     Date/Time Action Medication Dose Rate   03/10/23 2038 Given   azithromycin (ZITHROMAX) tablet 500 mg 500 mg    03/10/23 2038 New Bag/Given   cefTRIAXone (ROCEPHIN) 1 g in sodium chloride 0.9 % 100 mL IVPB 1 g 200 mL/hr        ___________________________________ Recent Labs  Lab 03/10/23 1903  NA 133*  K 3.1*  CO2 23  GLUCOSE 109*  BUN 8  CREATININE 0.91  CALCIUM 8.8*    Cr   stable,   Lab Results  Component Value Date   CREATININE 0.91 03/10/2023   CREATININE 0.67 05/27/2022   CREATININE 0.70 12/30/2021    Recent Labs  Lab 03/10/23 1903  AST 29  ALT 21  ALKPHOS 43  BILITOT 0.7  PROT 7.8  ALBUMIN 3.5   Lab Results  Component Value Date   CALCIUM 8.8 (L) 03/10/2023    Plt: Lab Results  Component Value Date   PLT 270 03/10/2023       Recent Labs  Lab 03/10/23 1903  WBC 20.4*  NEUTROABS 18.4*  HGB 11.1*  HCT 36.8  MCV 83.1  PLT 270    HG/HCT  stable,     Component Value Date/Time   HGB 11.1 (L) 03/10/2023 1903   HGB 10.5 (L) 01/20/2017 1100   HCT 36.8 03/10/2023 1903   HCT 34.7 01/20/2017 1100   MCV 83.1 03/10/2023 1903   MCV 87 01/20/2017 1100    _______________________________________________ Hospitalist was called for admission for   CAP Sepsis,  hypokalemia   The following Work up has been ordered so far:  Orders Placed This Encounter  Procedures   Blood Culture (routine x 2)   Urine Culture (for pregnant, neutropenic or urologic patients or patients with an indwelling urinary catheter)   Resp panel by RT-PCR (RSV, Flu A&B, Covid) Anterior Nasal Swab   DG Chest Port 1 View   CT ABDOMEN PELVIS W CONTRAST   Lactic acid, plasma   Comprehensive metabolic panel   CBC with Differential   Protime-INR   APTT   Urinalysis, Routine w reflex microscopic -Urine, Clean Catch   hCG, serum, qualitative   Diet NPO time specified   Document height and weight   Assess and Document Glasgow Coma Scale   Document vital signs within 1-hour of fluid bolus completion.  Notify provider of abnormal vital signs despite fluid resuscitation.   Refer to Sidebar Report: Sepsis Bundle ED/IP   Notify provider for difficulties obtaining IV access   Initiate Carrier Fluid Protocol   DO NOT delay antibiotics if unable to obtain blood culture.   Code Sepsis activation.  This occurs automatically when order is signed and prioritizes pharmacy, lab, and radiology services for STAT collections and interventions.  If [facility] downtime, call [facility] [phone]) to activate Code Sepsis.   Consult to hospitalist   Airborne and Contact precautions   EKG 12-Lead   ED EKG 12-Lead   Insert peripheral IV X 1   Insert 2nd peripheral IV if not already present.     OTHER Significant initial  Findings:  labs showing:     DM  labs:  HbA1C: No results for input(s): "HGBA1C" in the last 8760 hours.     CBG (last 3)  No results for input(s): "GLUCAP" in the last 72 hours.        Cultures:    Component Value Date/Time   SDES URINE, CLEAN CATCH 10/09/2021 0957   SPECREQUEST NONE 10/09/2021 0957   CULT (A) 10/09/2021 0957    <10,000 COLONIES/mL INSIGNIFICANT GROWTH Performed at [facility], [address] [place], [place] [address]    REPTSTATUS  10/10/2021 FINAL 10/09/2021 0957     Radiological Exams on Admission: CT ABDOMEN PELVIS W CONTRAST  Result Date: 03/10/2023 CLINICAL DATA:  Abdominal pain EXAM: CT ABDOMEN AND PELVIS WITH CONTRAST TECHNIQUE: Multidetector CT imaging of the abdomen and pelvis was performed using the standard protocol following bolus administration of intravenous contrast. RADIATION DOSE REDUCTION: This exam was performed according to the departmental dose-optimization program which includes automated exposure control, adjustment of the mA and/or kV according to patient size and/or use of iterative reconstruction technique. CONTRAST:  OMNIPAQUE IOHEXOL 300 MG/ML  SOLN COMPARISON:  CT abdomen and pelvis with contrast December 30, 2021 FINDINGS: Lower chest: The visualized bibasilar lungs are clear. Normal heart size and no pericardial effusion. Hepatobiliary: No focal liver abnormality is seen. No gallstones, gallbladder wall thickening, or biliary dilatation. Pancreas: Unremarkable. No pancreatic ductal dilatation or surrounding inflammatory changes. Spleen: Normal in size without focal abnormality. Adrenals/Urinary Tract: Adrenal glands are unremarkable. Kidneys are normal, without renal calculi, focal lesion, or hydronephrosis. Bladder is unremarkable. Stomach/Bowel: Stomach is within normal limits. Appendix appears normal. No evidence of bowel wall thickening, distention, or inflammatory changes. Vascular/Lymphatic: No significant vascular findings are present. No enlarged abdominal or pelvic lymph nodes. Reproductive: Uterus and bilateral adnexa are unremarkable. Other: No abdominal wall hernia or abnormality. No abdominopelvic ascites. Musculoskeletal: No acute or significant osseous findings. IMPRESSION: No etiology for abdominal pain is identified. Electronically Signed   By: Jacob Moores M.D.   On: 03/10/2023 21:25   DG Chest Port 1 View  Result Date: 03/10/2023 CLINICAL DATA:  Questionable sepsis-evaluate for  abnormality. Upper right chest pain or shortness of breath EXAM: PORTABLE CHEST 1 VIEW COMPARISON:  None Available. FINDINGS: The heart size and mediastinal contours are within normal limits. Infiltrates and peribronchial wall thickening about the infrahilar right lung. Otherwise no focal consolidation, pleural effusion pneumothorax. The visualized skeletal structures are unremarkable. A turtle projects over the upper mid chest. IMPRESSION: Infiltrates and peribronchial wall thickening about the infrahilar right lung, likely infectious/inflammatory Electronically Signed   By: Minerva Fester M.D.   On: 03/10/2023 20:21   _______________________________________________________________________________________________________ Latest  Blood pressure 105/64, pulse 77, temperature 99 F (37.2 C),  temperature source Oral, resp. rate 19, height 5\' 4"  (1.626 m), weight 69.9 kg, last menstrual period 03/08/2023, SpO2 100 %.   Vitals  labs and radiology finding personally reviewed  Review of Systems:    Pertinent positives include:  , Fevers, chills, fatigue,  Constitutional:  No weight loss, night sweats weight loss  HEENT:  No headaches, Difficulty swallowing,Tooth/dental problems,Sore throat,  No sneezing, itching, ear ache, nasal congestion, post nasal drip,  Cardio-vascular:  No chest pain, Orthopnea, PND, anasarca, dizziness, palpitations.no Bilateral lower extremity swelling  GI:  No heartburn, indigestion, abdominal pain, nausea, vomiting, diarrhea, change in bowel habits, loss of appetite, melena, blood in stool, hematemesis Resp:  no shortness of breath at rest. No dyspnea on exertion, No excess mucus, no productive cough, No non-productive cough, No coughing up of blood.No change in color of mucus.No wheezing. Skin:  no rash or lesions. No jaundice GU:  no dysuria, change in color of urine, no urgency or frequency. No straining to urinate.  No flank pain.  Musculoskeletal:  No joint pain  or no joint swelling. No decreased range of motion. No back pain.  Psych:  No change in mood or affect. No depression or anxiety. No memory loss.  Neuro: no localizing neurological complaints, no tingling, no weakness, no double vision, no gait abnormality, no slurred speech, no confusion  All systems reviewed and apart from HOPI all are negative _______________________________________________________________________________________________ Past Medical History:   Past Medical History:  Diagnosis Date   Asthma    IBS (irritable bowel syndrome)    Migraine    Pregnant       Past Surgical History:  Procedure Laterality Date   MOUTH SURGERY     Wisdom teeth "pulled"    WISDOM TOOTH EXTRACTION      Social History:  Ambulatory   independently     reports that she has never smoked. She has never used smokeless tobacco. She reports current alcohol use. She reports current drug use. Drug: Marijuana.    Family History:  Family History  Problem Relation Age of Onset   Hypertension Mother    Cancer Mother    Stroke Mother    Heart attack Father 46   ______________________________________________________________________________________________ Allergies: No Known Allergies   Prior to Admission medications   Medication Sig Start Date End Date Taking? Authorizing Provider  acetaminophen (TYLENOL) 500 MG tablet Take 1 tablet (500 mg total) by mouth every 6 (six) hours as needed. 07/31/22   Derwood Kaplan, MD  albuterol (VENTOLIN HFA) 108 (90 Base) MCG/ACT inhaler Inhale 2 puffs into the lungs every 4 (four) hours as needed for wheezing or shortness of breath.  12/14/19   [provider]  doxycycline (VIBRAMYCIN) 100 MG capsule Take 1 capsule (100 mg total) by mouth 2 (two) times daily. 11/26/22   Chrzanowski, Clearnce Hasten B, NP  metroNIDAZOLE (METROGEL) 0.75 % vaginal gel Insert one appful hs x 5 nights. 11/26/22   Chrzanowski, Jami B, NP  rizatriptan (MAXALT-MLT) 10 MG  disintegrating tablet Take 1 tablet (10 mg total) by mouth as needed for migraine. May repeat in 2 hours if needed 02/06/22   Horton, Danford Bad M, DO  loratadine (CLARITIN) 10 MG tablet Take 1 tablet (10 mg total) by mouth daily. Patient not taking: Reported on 09/07/2018 10/28/16 12/25/20  Brock Bad, MD    ___________________________________________________________________________________________________ Physical Exam:    03/10/2023    8:29 PM 03/10/2023    7:05 PM 03/10/2023    6:45 PM  Vitals with BMI  Systolic 105 111 161  Diastolic 64 67 61  Pulse 77 91 99     1. General:  in No  Acute distress   Chronically ill  -appearing 2. Psychological: Alert and   Oriented 3. Head/ENT:    Dry Mucous Membranes                          Head Non traumatic, neck supple                          Normal   Dentition 4. SKIN:  decreased Skin turgor,  Skin clean Dry redness and firmness of the right breast   With a few excoriations 5. Heart: Regular rate and rhythm no  Murmur, no Rub or gallop 6. Lungs: no wheezes or crackles   7. Abdomen: Soft,  non-tender, Non distended  bowel sounds present 8. Lower extremities: no clubbing, cyanosis, no  edema 9. Neurologically Grossly intact, moving all 4 extremities equally   10. MSK: Normal range of motion    Chart has been reviewed  ______________________________________________________________________________________________  Assessment/Plan  27 y.o. female with medical history significant of Asthma, anemia  Admitted for  right breast cellulitis Sepsis  Hypokalemia     Present on Admission:  Sepsis (HCC)  Breast pain, right  Cellulitis of right breast     Breast pain, right Noted some mild redness and swelling Will obtain further imaging to RO breast abcess  Asthma Chronic stable  Sepsis (HCC)  -SIRS criteria met with  elevated white blood cell count,       Component Value Date/Time   WBC 20.4 (H) 03/10/2023 1903   LYMPHSABS 0.6  (L) 03/10/2023 1903   LYMPHSABS 2.3 09/14/2016 1037     tachycardia   ,   fever   RR >20 Today's Vitals   03/10/23 1800 03/10/23 1845 03/10/23 1905 03/10/23 2029  BP: 105/66 109/61 111/67 105/64  Pulse: (!) 107 99 91 77  Resp: 13 20 (!) 22 19  Temp:    99 F (37.2 C)  TempSrc:    Oral  SpO2: 98% 100% 100% 100%  Weight:      Height:      PainSc:    5    Body mass index is 26.45 kg/m.  This patient meets SIRS Criteria and may be septic.    Patient meeting criteria for Severe sepsis with    evidence of end organ damage/organ dysfunction such as   elevated lactic acid >2     Component Value Date/Time   LATICACIDVEN 2.2 (HH) 03/10/2023 1950      - Obtain serial lactic acid and procalcitonin level.  - Initiated IV antibiotics in ER: Antibiotics Given (last 72 hours)     Date/Time Action Medication Dose Rate   03/10/23 2038 Given   azithromycin (ZITHROMAX) tablet 500 mg 500 mg    03/10/23 2038 New Bag/Given   cefTRIAXone (ROCEPHIN) 1 g in sodium chloride 0.9 % 100 mL IVPB 1 g 200 mL/hr       Will continue  on : rocephin,  and add vanc   - await results of blood and urine culture  - Rehydrate aggressively  Intravenous fluids were administered,          30cc/kg fluid   Initial work up was worrisome for CAP but suspect source of infection is right breast cellulitis  No CAP on Chest CT 10:55 PM  Cellulitis of right breast -admit per  cellulitis protocol will       continue current antibiotic choice      plain films showed:   no evidence of air  no evidence of osteomyelitis  no abcess no    foreign   objects     Will obtain MRSA screening,       obtain blood cultures  if febrile or septic     further antibiotic adjustment pending above results    Other plan as per orders.  DVT prophylaxis:  SCD      Code Status:    Code Status: Prior FULL CODE  as per patient   I had personally discussed CODE STATUS with patient   ACP none   Family Communication:    Family not at  Bedside    Diet  Diet Orders (From admission, onward)     Start     Ordered   03/10/23 1827  Diet NPO time specified  (Undifferentiated presentation (screening labs and basic nursing orders))  Diet effective now        03/10/23 1827            Disposition Plan:     To home once workup is complete and patient is stable   Following barriers for discharge:                            Electrolytes corrected                                    Consult Orders  (From admission, onward)           Start     Ordered   03/10/23 2135  Consult to hospitalist  Once       Provider:  (Not yet assigned)  Question Answer Comment  Place call to: [facility]   Reason for Consult Admit      03/10/23 2134                               Consults called: none   Admission status:  ED Disposition     ED Disposition  Admit   Condition  --   Comment  Hospital Area: [facility] [[id number]]  Level of Care: Progressive [102]  Admit to Progressive based on following criteria: MULTISYSTEM THREATS such as stable sepsis, metabolic/electrolyte imbalance with or without encephalopathy that is responding to early treatment.  May admit patient to [facility] or [facility] if equivalent level of care is available:: No  Covid Evaluation: Confirmed COVID Negative  Diagnosis: Sepsis [facility]) [[id number]]  Admitting Physician: Therisa Doyne [[id number]]  Attending Physician: Therisa Doyne [[id number]]  Certification:: I certify this patient will need inpatient services for at least 2 midnights  Estimated Length of Stay: 2           inpatient     I Expect 2 midnight stay secondary to severity of patient's current illness need for inpatient interventions justified by the following:     Severe lab/radiological/exam abnormalities including:    Right breast cellulits and extensive comorbidities including:  substance abuse    That are currently affecting  medical management.   I expect  patient to be hospitalized for 2 midnights requiring inpatient medical care.  Patient is at high risk  for adverse outcome (such as loss of life or disability) if not treated.  Indication for inpatient stay as follows:    severe pain requiring acute inpatient management,      Need for IV antibiotics, IV fluids,     Level of care       progressive tele indefinitely please discontinue once patient no longer qualifies COVID-19 Labs    Lab Results  Component Value Date   SARSCOV2NAA NEGATIVE 03/10/2023     Precautions: admitted as   Covid Negative     Katha Kuehne 03/11/2023, 12:17 AM    [facility]     after 2 AM please page floor coverage PA If 7AM-7PM, please contact the day team taking care of the patient using [email or web address]

## 2023-03-10 NOTE — Sepsis Progress Note (Signed)
[facility] monitoring for the code sepsis protocol.  

## 2023-03-10 NOTE — Subjective & Objective (Signed)
Right side rib/breast back pain fever 102.4 Some SOB No IV DU Some left lower back tenderness

## 2023-03-10 NOTE — ED Notes (Signed)
Critical Result, Lactic 2.2 reported by E. Theda Belfast.

## 2023-03-10 NOTE — Assessment & Plan Note (Signed)
Chronic-stable.

## 2023-03-11 ENCOUNTER — Inpatient Hospital Stay (HOSPITAL_COMMUNITY): Payer: Medicaid Other

## 2023-03-11 DIAGNOSIS — A419 Sepsis, unspecified organism: Secondary | ICD-10-CM

## 2023-03-11 DIAGNOSIS — N61 Mastitis without abscess: Secondary | ICD-10-CM | POA: Diagnosis present

## 2023-03-11 HISTORY — DX: Mastitis without abscess: N61.0

## 2023-03-11 LAB — COMPREHENSIVE METABOLIC PANEL
ALT: 19 U/L (ref 0–44)
AST: 25 U/L (ref 15–41)
Albumin: 3.1 g/dL — ABNORMAL LOW (ref 3.5–5.0)
Alkaline Phosphatase: 44 U/L (ref 38–126)
Anion gap: 8 (ref 5–15)
BUN: 7 mg/dL (ref 6–20)
CO2: 21 mmol/L — ABNORMAL LOW (ref 22–32)
Calcium: 8.2 mg/dL — ABNORMAL LOW (ref 8.9–10.3)
Chloride: 105 mmol/L (ref 98–111)
Creatinine, Ser: 0.79 mg/dL (ref 0.44–1.00)
GFR, Estimated: >60 mL/min (ref >60)
Glucose, Bld: 104 mg/dL — ABNORMAL HIGH (ref 70–99)
Potassium: 4 mmol/L (ref 3.5–5.1)
Sodium: 134 mmol/L — ABNORMAL LOW (ref 135–145)
Total Bilirubin: 0.8 mg/dL (ref 0.3–1.2)
Total Protein: 6.9 g/dL (ref 6.5–8.1)

## 2023-03-11 LAB — RESPIRATORY PANEL BY PCR

## 2023-03-11 LAB — CBC
HCT: 34.5 % — ABNORMAL LOW (ref 36.0–46.0)
Hemoglobin: 10.3 g/dL — ABNORMAL LOW (ref 12.0–15.0)
MCH: 25 pg — ABNORMAL LOW (ref 26.0–34.0)
MCHC: 29.9 g/dL — ABNORMAL LOW (ref 30.0–36.0)
MCV: 83.7 fL (ref 80.0–100.0)
Platelets: 223 10*3/uL (ref 150–400)
RBC: 4.12 MIL/uL (ref 3.87–5.11)
RDW: 15.7 % — ABNORMAL HIGH (ref 11.5–15.5)
WBC: 24.4 10*3/uL — ABNORMAL HIGH (ref 4.0–10.5)
nRBC: 0 % (ref 0.0–0.2)

## 2023-03-11 LAB — PREALBUMIN: Prealbumin: 21 mg/dL (ref 18–38)

## 2023-03-11 LAB — BLOOD GAS, VENOUS
Acid-base deficit: 9 mmol/L — ABNORMAL HIGH (ref 0.0–2.0)
Bicarbonate: 16.9 mmol/L — ABNORMAL LOW (ref 20.0–28.0)
O2 Saturation: 40.9 %
Patient temperature: 37
pCO2, Ven: 36 mmHg — ABNORMAL LOW (ref 44–60)
pH, Ven: 7.28 (ref 7.25–7.43)
pO2, Ven: <31 mmHg — CL (ref 32–45)

## 2023-03-11 LAB — MRSA NEXT GEN BY PCR, NASAL: MRSA by PCR Next Gen: NOT DETECTED

## 2023-03-11 LAB — STREP PNEUMONIAE URINARY ANTIGEN: Strep Pneumo Urinary Antigen: NEGATIVE

## 2023-03-11 LAB — C-REACTIVE PROTEIN: CRP: 4.3 mg/dL — ABNORMAL HIGH (ref <1.0)

## 2023-03-11 LAB — CULTURE, BLOOD (ROUTINE X 2): Culture: NO GROWTH

## 2023-03-11 LAB — HIV ANTIBODY (ROUTINE TESTING W REFLEX): HIV Screen 4th Generation wRfx: NONREACTIVE

## 2023-03-11 LAB — MAGNESIUM: Magnesium: 2 mg/dL (ref 1.7–2.4)

## 2023-03-11 LAB — PHOSPHORUS: Phosphorus: 3.3 mg/dL (ref 2.5–4.6)

## 2023-03-11 MED ORDER — ACETAMINOPHEN 650 MG RE SUPP: 650.0000 mg | Freq: Four times a day (QID) | RECTAL | Status: DC | PRN

## 2023-03-11 MED ORDER — PROCHLORPERAZINE EDISYLATE 10 MG/2ML IJ SOLN
10.0000 mg | Freq: Once | INTRAMUSCULAR | Status: AC
  Administered 2023-03-11: 10 mg via INTRAVENOUS
  Filled 2023-03-11: qty 2

## 2023-03-11 MED ORDER — ONDANSETRON HCL 4 MG PO TABS: 4.0000 mg | ORAL_TABLET | Freq: Four times a day (QID) | ORAL | Status: DC | PRN

## 2023-03-11 MED ORDER — POLYETHYLENE GLYCOL 3350 17 G PO PACK: 17.0000 g | PACK | Freq: Every day | ORAL | Status: DC | PRN

## 2023-03-11 MED ORDER — ACETAMINOPHEN 325 MG PO TABS
650.0000 mg | ORAL_TABLET | Freq: Four times a day (QID) | ORAL | Status: DC | PRN
  Administered 2023-03-11 (×2): 650 mg via ORAL
  Filled 2023-03-11 (×2): qty 2

## 2023-03-11 MED ORDER — SODIUM CHLORIDE 0.9 % IV SOLN: INTRAVENOUS | Status: DC

## 2023-03-11 MED ORDER — DOCUSATE SODIUM 100 MG PO CAPS
100.0000 mg | ORAL_CAPSULE | Freq: Two times a day (BID) | ORAL | Status: DC
  Administered 2023-03-12: 100 mg via ORAL
  Filled 2023-03-11 (×2): qty 1

## 2023-03-11 MED ORDER — ORAL CARE MOUTH RINSE: 15.0000 mL | OROMUCOSAL | Status: DC | PRN

## 2023-03-11 MED ORDER — ALBUTEROL SULFATE (2.5 MG/3ML) 0.083% IN NEBU: 2.5000 mg | INHALATION_SOLUTION | RESPIRATORY_TRACT | Status: DC | PRN

## 2023-03-11 MED ORDER — ONDANSETRON HCL 4 MG/2ML IJ SOLN
4.0000 mg | Freq: Four times a day (QID) | INTRAMUSCULAR | Status: DC | PRN
  Administered 2023-03-11: 4 mg via INTRAVENOUS
  Filled 2023-03-11: qty 2

## 2023-03-11 MED ORDER — HYDROCODONE-ACETAMINOPHEN 5-325 MG PO TABS
1.0000 | ORAL_TABLET | ORAL | Status: DC | PRN
  Administered 2023-03-11 (×3): 1 via ORAL
  Administered 2023-03-11 – 2023-03-12 (×2): 2 via ORAL
  Filled 2023-03-11 (×2): qty 1
  Filled 2023-03-11: qty 2
  Filled 2023-03-11: qty 1
  Filled 2023-03-11: qty 2

## 2023-03-11 MED ORDER — SENNA 8.6 MG PO TABS
1.0000 | ORAL_TABLET | Freq: Two times a day (BID) | ORAL | Status: DC
  Administered 2023-03-12: 8.6 mg via ORAL
  Filled 2023-03-11 (×2): qty 1

## 2023-03-11 NOTE — Assessment & Plan Note (Signed)
-  admit per  cellulitis protocol will       continue current antibiotic choice      plain films showed:   no evidence of air  no evidence of osteomyelitis  no abcess no    foreign   objects     Will obtain MRSA screening,       obtain blood cultures  if febrile or septic     further antibiotic adjustment pending above results

## 2023-03-11 NOTE — TOC CM/SW Note (Signed)
Transition of Care [facility]) - Inpatient Brief Assessment   Patient Details  Name: Emily Callahan MRN: [id number] Date of Birth: 1996/06/07  Transition of Care [facility]) CM/SW Contact:    Howell Rucks, RN Phone Number: 03/11/2023, 11:17 AM   Clinical Narrative: met with pt at bedside to introduce role of TOC/NCM and review for dc planning. Pt reports she has a PCP and pharmacy in place, no home DME or home care services, has transportation available at discharge. TOC Brief Assessment completed. No TOC needs identified.    Transition of Care Asessment: Insurance and Status: Insurance coverage has been reviewed Patient has primary care physician: Yes Home environment has been reviewed: lives in apartment with family Prior level of function:: Independent Prior/Current Home Services: No current home services Social Determinants of Health Reivew: SDOH reviewed no interventions necessary Readmission risk has been reviewed: Yes Transition of care needs: no transition of care needs at this time

## 2023-03-11 NOTE — Progress Notes (Addendum)
HOSPITALIST ROUNDING NOTE Shaquela Olea  ZOX:[id number] DOB: [birth year] DOA: 03/10/2023 PCP: Pcp, No From: Home   Current Dispo: Home  Code Status: Full   03/11/2023, 7:08 AM,  LOS: 42 day   27 years old G1 status post NSVD 04/08/2017--- ASCUS + 11/05/2022 Presented to ED 03/10/2023 right breast pain, LBP, intermittent SOB--initial workup?  PNA CXR lactic acid 2.2 CRP 4.3, ESR 35 WBC 20 U tox opiates +, THC + CTA chest = prominence right breast >left no discrete abscess-reactive axillary nodes right side-no pulmonary emboli no other focal abnormality-CT ABD negative CXR?  Infiltrates peribronchial wall thickening   plan Sepsis on admit 2/2 right breast abscess US breast done--official read is pending-continuing at this time broad-spectrum ceftriaxone vancomycin--- if something untoward or suspicious turns out on imaging, will discuss with general surgery colleagues Nursing to mark out area of erythema Predisposing factor pimples etc.-has never had this before  ?  PNA Unclear if she actually has PNA--- CT does not confirm this  THC habituation Would recommend that she quit  ASCUS + 10/2022 Patient is aware of this diagnosis and will follow in the outpatient with her GYN   DVT prophylaxis: SCD  Status is: Inpatient Remains inpatient appropriate because:   Requires further workup possible surgery involvement    Subjective: Discomfort along lateral border right breast and some back pain States this is never happened before No shortness of breath no sputum  Objective + exam Vitals:   03/10/23 1905 03/10/23 2029 03/11/23 0100 03/11/23 0512  BP: 111/67 105/64 106/62 106/64  Pulse: 91 77 91 85  Resp: (!) 22 19 16 20   Temp:  99 F (37.2 C) (!) 101.2 F (38.4 C) 99.4 F (37.4 C)  TempSrc:  Oral Oral Oral  SpO2: 100% 100% 100% 100%  Weight:      Height:       Filed Weights   03/10/23 1741  Weight: 69.9 kg    Examination:  EOMI NCAT no icterus no pallor No submandibular  lymphadenopathy no supraclavicular lymphadenopathy Right breast examined in presence of nurse as chaperone-diffuse right lower outer quadrant erythema with peau d'orange pattern-no discerning/fluctuant areas along this border Abdomen is soft-belly ring in place No lower extremity edema Multiple tattoos  Data Reviewed: reviewed   CBC    Component Value Date/Time   WBC 24.4 (H) 03/11/2023 0433   RBC 4.12 03/11/2023 0433   HGB 10.3 (L) 03/11/2023 0433   HGB 10.5 (L) 01/20/2017 1100   HCT 34.5 (L) 03/11/2023 0433   HCT 34.7 01/20/2017 1100   PLT 223 03/11/2023 0433   PLT 268 01/20/2017 1100   MCV 83.7 03/11/2023 0433   MCV 87 01/20/2017 1100   MCH 25.0 (L) 03/11/2023 0433   MCHC 29.9 (L) 03/11/2023 0433   RDW 15.7 (H) 03/11/2023 0433   RDW 13.9 01/20/2017 1100   LYMPHSABS 0.6 (L) 03/10/2023 1903   LYMPHSABS 2.3 09/14/2016 1037   MONOABS 1.2 (H) 03/10/2023 1903   EOSABS 0.0 03/10/2023 1903   EOSABS 0.1 09/14/2016 1037   BASOSABS 0.0 03/10/2023 1903   BASOSABS 0.0 09/14/2016 1037      Latest Ref Rng & Units 03/11/2023    4:33 AM 03/10/2023    7:03 PM 05/27/2022    7:57 AM  CMP  Glucose 70 - 99 mg/dL 811  914  782   BUN 6 - 20 mg/dL 7  8  8    Creatinine 0.44 - 1.00 mg/dL 9.56  2.13  0.86   Sodium  135 - 145 mmol/L 134  133  143   Potassium 3.5 - 5.1 mmol/L 4.0  3.1  3.7   Chloride 98 - 111 mmol/L 105  100  112   CO2 22 - 32 mmol/L 21  23  25    Calcium 8.9 - 10.3 mg/dL 8.2  8.8  9.6   Total Protein 6.5 - 8.1 g/dL 6.9  7.8  7.8   Total Bilirubin 0.3 - 1.2 mg/dL 0.8  0.7  0.5   Alkaline Phos 38 - 126 U/L 44  43  38   AST 15 - 41 U/L 25  29  23    ALT 0 - 44 U/L 19  21  29       Scheduled Meds:  docusate sodium  100 mg Oral BID   senna  1 tablet Oral BID   Continuous Infusions:  cefTRIAXone (ROCEPHIN)  IV     lactated ringers 150 mL/hr (03/11/23 0609)   potassium PHOSPHATE IVPB (in mmol) 43 mL/hr at 03/11/23 0600   vancomycin      Time 46  Rhetta Mura, MD   [facility]

## 2023-03-12 DIAGNOSIS — A419 Sepsis, unspecified organism: Secondary | ICD-10-CM | POA: Diagnosis not present

## 2023-03-12 LAB — CBC
HCT: 31.8 % — ABNORMAL LOW (ref 36.0–46.0)
Hemoglobin: 9.8 g/dL — ABNORMAL LOW (ref 12.0–15.0)
MCH: 25.6 pg — ABNORMAL LOW (ref 26.0–34.0)
MCHC: 30.8 g/dL (ref 30.0–36.0)
MCV: 83 fL (ref 80.0–100.0)
Platelets: 226 10*3/uL (ref 150–400)
RBC: 3.83 MIL/uL — ABNORMAL LOW (ref 3.87–5.11)
RDW: 15.7 % — ABNORMAL HIGH (ref 11.5–15.5)
WBC: 14.5 10*3/uL — ABNORMAL HIGH (ref 4.0–10.5)
nRBC: 0 % (ref 0.0–0.2)

## 2023-03-12 LAB — URINE CULTURE

## 2023-03-12 LAB — CULTURE, BLOOD (ROUTINE X 2): Special Requests: ADEQUATE

## 2023-03-12 LAB — LEGIONELLA PNEUMOPHILA SEROGP 1 UR AG: L. pneumophila Serogp 1 Ur Ag: NEGATIVE

## 2023-03-12 MED ORDER — DOXYCYCLINE HYCLATE 100 MG PO TABS
100.0000 mg | ORAL_TABLET | Freq: Two times a day (BID) | ORAL | Status: DC
  Administered 2023-03-12 – 2023-03-13 (×3): 100 mg via ORAL
  Filled 2023-03-12 (×3): qty 1

## 2023-03-12 MED ORDER — DOXYCYCLINE HYCLATE 100 MG PO TABS: 100.0000 mg | ORAL_TABLET | Freq: Two times a day (BID) | ORAL | Status: DC

## 2023-03-12 NOTE — Plan of Care (Signed)
°  Problem: Activity: °Goal: Risk for activity intolerance will decrease °Outcome: Progressing °  °Problem: Elimination: °Goal: Will not experience complications related to bowel motility °Outcome: Progressing °Goal: Will not experience complications related to urinary retention °Outcome: Progressing °  °

## 2023-03-12 NOTE — Progress Notes (Signed)
HOSPITALIST ROUNDING NOTE Calei Parral  VWU:[id number] DOB: [birth year] DOA: 03/10/2023 PCP: Pcp, No From: Home   Current Dispo: Home  Code Status: Full   03/12/2023, 12:39 PM,  LOS: 21 days   27 years old G1 status post NSVD 04/08/2017--- ASCUS + 11/05/2022 Presented to ED 03/10/2023 right breast pain, LBP, intermittent SOB--initial workup?  PNA CXR lactic acid 2.2 CRP 4.3, ESR 35 WBC 20 U tox opiates +, THC + CTA chest = prominence right breast >left no discrete abscess-reactive axillary nodes right side-no pulmonary emboli no other focal abnormality-CT ABD negative CXR?  Infiltrates peribronchial wall thickening  7/4 [place] R breast=Cellulitis, no drainable abscess   plan Sepsis on admit 2/2 right breast abscess--improving US breast as above Pain still present but has improved Switch abx to Doyxcycline 100 bid and monitor effect--if fever or spread of erythema would change back to IV therapy   PNa was disproven on CT chest  THC habituation Would recommend that she quit  ASCUS + 10/2022 Patient is aware of this diagnosis and will follow in the outpatient with her GYN   DVT prophylaxis: SCD  Status is: Inpatient Remains inpatient appropriate because:   Requires further workup possible surgery involvement    Subjective:  Awake pleasant in nad no focal deficit Pain improved  Objective + exam Vitals:   03/11/23 0900 03/11/23 1337 03/11/23 2037 03/12/23 0436  BP: 116/62 115/63 114/74 115/64  Pulse: 70 94 86 77  Resp: 16 18 16 18   Temp: 98.9 F (37.2 C) 99.7 F (37.6 C) 99.9 F (37.7 C) 99.4 F (37.4 C)  TempSrc: Oral Oral Oral Oral  SpO2: 100% 99% 100% 100%  Weight:      Height:       Filed Weights   03/10/23 1741  Weight: 69.9 kg    Examination:  Eomi ncat no focal deficit Chest clear  Breast exam as above with chaperone No LE edema  Data Reviewed: reviewed   CBC    Component Value Date/Time   WBC 14.5 (H) 03/12/2023 0444   RBC 3.83 (L) 03/12/2023 0444    HGB 9.8 (L) 03/12/2023 0444   HGB 10.5 (L) 01/20/2017 1100   HCT 31.8 (L) 03/12/2023 0444   HCT 34.7 01/20/2017 1100   PLT 226 03/12/2023 0444   PLT 268 01/20/2017 1100   MCV 83.0 03/12/2023 0444   MCV 87 01/20/2017 1100   MCH 25.6 (L) 03/12/2023 0444   MCHC 30.8 03/12/2023 0444   RDW 15.7 (H) 03/12/2023 0444   RDW 13.9 01/20/2017 1100   LYMPHSABS 0.6 (L) 03/10/2023 1903   LYMPHSABS 2.3 09/14/2016 1037   MONOABS 1.2 (H) 03/10/2023 1903   EOSABS 0.0 03/10/2023 1903   EOSABS 0.1 09/14/2016 1037   BASOSABS 0.0 03/10/2023 1903   BASOSABS 0.0 09/14/2016 1037      Latest Ref Rng & Units 03/11/2023    4:33 AM 03/10/2023    7:03 PM 05/27/2022    7:57 AM  CMP  Glucose 70 - 99 mg/dL 295  621  308   BUN 6 - 20 mg/dL 7  8  8    Creatinine 0.44 - 1.00 mg/dL 6.57  8.46  9.62   Sodium 135 - 145 mmol/L 134  133  143   Potassium 3.5 - 5.1 mmol/L 4.0  3.1  3.7   Chloride 98 - 111 mmol/L 105  100  112   CO2 22 - 32 mmol/L 21  23  25    Calcium 8.9 - 10.3 mg/dL  8.2  8.8  9.6   Total Protein 6.5 - 8.1 g/dL 6.9  7.8  7.8   Total Bilirubin 0.3 - 1.2 mg/dL 0.8  0.7  0.5   Alkaline Phos 38 - 126 U/L 44  43  38   AST 15 - 41 U/L 25  29  23    ALT 0 - 44 U/L 19  21  29       Scheduled Meds:  docusate sodium  100 mg Oral BID   doxycycline  100 mg Oral Q12H   senna  1 tablet Oral BID   Continuous Infusions:    Time 46  Rhetta Mura, MD  [facility]

## 2023-03-13 DIAGNOSIS — A419 Sepsis, unspecified organism: Secondary | ICD-10-CM | POA: Diagnosis not present

## 2023-03-13 LAB — CULTURE, BLOOD (ROUTINE X 2)

## 2023-03-13 LAB — CBC
HCT: 35.5 % — ABNORMAL LOW (ref 36.0–46.0)
Hemoglobin: 10.6 g/dL — ABNORMAL LOW (ref 12.0–15.0)
MCH: 25 pg — ABNORMAL LOW (ref 26.0–34.0)
MCHC: 29.9 g/dL — ABNORMAL LOW (ref 30.0–36.0)
MCV: 83.7 fL (ref 80.0–100.0)
Platelets: 236 10*3/uL (ref 150–400)
RBC: 4.24 MIL/uL (ref 3.87–5.11)
RDW: 15.8 % — ABNORMAL HIGH (ref 11.5–15.5)
WBC: 9 10*3/uL (ref 4.0–10.5)
nRBC: 0 % (ref 0.0–0.2)

## 2023-03-13 MED ORDER — DOXYCYCLINE HYCLATE 100 MG PO TABS: 100.0000 mg | ORAL_TABLET | Freq: Two times a day (BID) | ORAL | 0 refills | Status: AC

## 2023-03-13 NOTE — Discharge Summary (Signed)
Physician Discharge Summary  Emily Callahan BMW:[id number] DOB: 08/18/1996 DOA: 03/10/2023  PCP: Pcp, No  Admit date: 03/10/2023 Discharge date: 03/13/2023  Time spent: 36 minutes  Recommendations for Outpatient Follow-up:  Needs OP follow up for ASCUS Need periodic labs  Discharge Diagnoses:  MAIN problem for hospitalization   R Breast cellulitis  Please see below for itemized issues addressed in HOpsital- refer to other progress notes for clarity if needed  Discharge Condition: improved  Diet recommendation: reg  Filed Weights   03/10/23 1741  Weight: 69.9 kg    History of present illness:  27 years old G1 status post NSVD 04/08/2017--- ASCUS + 11/05/2022 Presented to ED 03/10/2023 right breast pain, LBP, intermittent SOB--initial workup?  PNA CXR lactic acid 2.2 CRP 4.3, ESR 35 WBC 20 U tox opiates +, THC + CTA chest = prominence right breast >left no discrete abscess-reactive axillary nodes right side-no pulmonary emboli no other focal abnormality-CT ABD negative CXR?  Infiltrates peribronchial wall thickening   7/4 [place] R breast=Cellulitis, no drainable abscess   Hospital Course:  Sepsis on admit 2/2 right breast abscess--improved at d/c US breast as above Pain gone Switch abx to Doyxcycline 100 bid and complete as an OP--return precautions etc given   Pna was disproven on CT chest   THC habituation Would recommend that she quit   ASCUS + 10/2022 Patient is aware of this diagnosis and will follow in the outpatient with her GYN   Discharge Exam: Vitals:   03/12/23 1949 03/13/23 0616  BP: 129/86 105/74  Pulse: 65 62  Resp: 17 18  Temp: 98.3 F (36.8 C) 98 F (36.7 C)  SpO2: 100% 100%    Subj on day of d/c   Awake coherent in nad no focal deficit  General Exam on discharge  Breast examined in presence female chaperone--looks much improved area is not swollen/fluctuant Cta b NO added sound  Discharge Instructions   Discharge Instructions     Diet -  low sodium heart healthy   Complete by: As directed    Discharge instructions   Complete by: As directed    Please finish all the doxycycline--it should reliably clear up all the swelling Remember the main side effects --sun sensitivity + occasional stomach upset--a probiotic or plain yogurt can help with the latter Follow with your primary or woman MD in 1-2 months for all your other issues  Be mindful when shaving your arm-pit--suggest electric razor--If recurrence occurs, seek medical attention  Have a nice summer.   Increase activity slowly   Complete by: As directed       Allergies as of 03/13/2023   No Known Allergies      Medication List     STOP taking these medications    MULTIVITAMIN GUMMIES WOMENS PO   Pamprin All Day Relief Max St 220 MG tablet Generic drug: naproxen sodium       TAKE these medications    acetaminophen 500 MG tablet Commonly known as: TYLENOL Take 1 tablet (500 mg total) by mouth every 6 (six) hours as needed. What changed:  how much to take reasons to take this   doxycycline 100 MG tablet Commonly known as: VIBRA-TABS Take 1 tablet (100 mg total) by mouth every 12 (twelve) hours for 10 days.       No Known Allergies    The results of significant diagnostics from this hospitalization (including imaging, microbiology, ancillary and laboratory) are listed below for reference.    Significant Diagnostic Studies:  [place] LIMITED ULTRASOUND INCLUDING AXILLA RIGHT BREAST  Result Date: 03/11/2023 CLINICAL DATA:  Right breast pain and redness, assess for abscess EXAM: ULTRASOUND OF THE RIGHT BREAST COMPARISON:  None available. FINDINGS: Targeted right breast ultrasound was performed in the area of pain from 6:00 to 10:00. This demonstrates mild skin thickening and edema. No suspicious solid or cystic mass or fluid collection is visualized. Targeted right axillary ultrasound demonstrates normal soft tissue. No lymphadenopathy. IMPRESSION: There is  mild skin thickening and edema in the area of focal pain in the right breast. No drainable fluid collection. RECOMMENDATION: Any further workup of the patient's symptoms should be based on the clinical assessment. I have discussed the findings and recommendations with the patient. If applicable, a reminder letter will be sent to the patient regarding the next appointment. BI-RADS CATEGORY  2: Benign. Electronically Signed   By: Jacob Moores M.D.   On: 03/11/2023 17:19   CT Angio Chest Pulmonary Embolism (PE) W or WO Contrast  Result Date: 03/10/2023 CLINICAL DATA:  Right-sided chest pain with elevated D-dimer, initial encounter EXAM: CT ANGIOGRAPHY CHEST WITH CONTRAST TECHNIQUE: Multidetector CT imaging of the chest was performed using the standard protocol during bolus administration of intravenous contrast. Multiplanar CT image reconstructions and MIPs were obtained to evaluate the vascular anatomy. RADIATION DOSE REDUCTION: This exam was performed according to the departmental dose-optimization program which includes automated exposure control, adjustment of the mA and/or kV according to patient size and/or use of iterative reconstruction technique. CONTRAST:  60mL OMNIPAQUE IOHEXOL 350 MG/ML SOLN COMPARISON:  Chest x-ray from the previous day. FINDINGS: Cardiovascular: Thoracic aorta shows a normal branching pattern bilaterally. No aneurysmal dilatation or dissection is noted. The heart is within normal limits in size. No pericardial effusion is noted. The pulmonary artery shows a normal branching pattern bilaterally. No filling defect to suggest pulmonary embolism is noted. Mediastinum/Nodes: Thoracic inlet is within normal limits. No hilar or mediastinal adenopathy is noted. The esophagus as visualized is normal limits. Mildly prominent axillary lymph nodes are noted on the right when compared with the left side. No sizable parenchymal nodule is noted. Lungs/Pleura: Lungs are well aerated bilaterally.  No focal infiltrate or sizable effusion is seen. Upper Abdomen: Visualized upper abdomen shows no acute abnormality. Musculoskeletal: No acute bony abnormality is noted. The right breast is mildly enlarged compared to the left although no skin thickening or focal abscess is seen. Review of the MIP images confirms the above findings. IMPRESSION: Mild prominence of the right breast when compared with the left although no discrete abscess is seen. Some mildly prominent reactive axillary nodes are noted on the right. Correlate with any cutaneous etiology. No evidence of pulmonary emboli. No other focal abnormality is noted. Electronically Signed   By: Alcide Clever M.D.   On: 03/10/2023 23:10   CT ABDOMEN PELVIS W CONTRAST  Result Date: 03/10/2023 CLINICAL DATA:  Abdominal pain EXAM: CT ABDOMEN AND PELVIS WITH CONTRAST TECHNIQUE: Multidetector CT imaging of the abdomen and pelvis was performed using the standard protocol following bolus administration of intravenous contrast. RADIATION DOSE REDUCTION: This exam was performed according to the departmental dose-optimization program which includes automated exposure control, adjustment of the mA and/or kV according to patient size and/or use of iterative reconstruction technique. CONTRAST:  OMNIPAQUE IOHEXOL 300 MG/ML  SOLN COMPARISON:  CT abdomen and pelvis with contrast December 30, 2021 FINDINGS: Lower chest: The visualized bibasilar lungs are clear. Normal heart size and no pericardial effusion. Hepatobiliary: No focal liver  abnormality is seen. No gallstones, gallbladder wall thickening, or biliary dilatation. Pancreas: Unremarkable. No pancreatic ductal dilatation or surrounding inflammatory changes. Spleen: Normal in size without focal abnormality. Adrenals/Urinary Tract: Adrenal glands are unremarkable. Kidneys are normal, without renal calculi, focal lesion, or hydronephrosis. Bladder is unremarkable. Stomach/Bowel: Stomach is within normal limits. Appendix  appears normal. No evidence of bowel wall thickening, distention, or inflammatory changes. Vascular/Lymphatic: No significant vascular findings are present. No enlarged abdominal or pelvic lymph nodes. Reproductive: Uterus and bilateral adnexa are unremarkable. Other: No abdominal wall hernia or abnormality. No abdominopelvic ascites. Musculoskeletal: No acute or significant osseous findings. IMPRESSION: No etiology for abdominal pain is identified. Electronically Signed   By: Jacob Moores M.D.   On: 03/10/2023 21:25   DG Chest Port 1 View  Result Date: 03/10/2023 CLINICAL DATA:  Questionable sepsis-evaluate for abnormality. Upper right chest pain or shortness of breath EXAM: PORTABLE CHEST 1 VIEW COMPARISON:  None Available. FINDINGS: The heart size and mediastinal contours are within normal limits. Infiltrates and peribronchial wall thickening about the infrahilar right lung. Otherwise no focal consolidation, pleural effusion pneumothorax. The visualized skeletal structures are unremarkable. A turtle projects over the upper mid chest. IMPRESSION: Infiltrates and peribronchial wall thickening about the infrahilar right lung, likely infectious/inflammatory Electronically Signed   By: Minerva Fester M.D.   On: 03/10/2023 20:21    Microbiology: Recent Results (from the past 240 hour(s))  Urine Culture (for pregnant, neutropenic or urologic patients or patients with an indwelling urinary catheter)     Status: Abnormal   Collection Time: 03/10/23  7:02 PM   Specimen: Urine, Clean Catch  Result Value Ref Range Status   Specimen Description   Final    URINE, CLEAN CATCH Performed at [facility] [address], [place], [place] [address]    Special Requests   Final    NONE Performed at [facility], [address], [place], [place] [address]    Culture MULTIPLE SPECIES PRESENT, SUGGEST RECOLLECTION (A)  Final   Report Status 03/12/2023 FINAL  Final  Resp  panel by RT-PCR (RSV, Flu A&B, Covid) Urine, Clean Catch     Status: None   Collection Time: 03/10/23  7:02 PM   Specimen: Urine, Clean Catch; Nasal Swab  Result Value Ref Range Status   SARS Coronavirus 2 by RT PCR NEGATIVE NEGATIVE Final    Comment: (NOTE) SARS-CoV-2 target nucleic acids are NOT DETECTED.  The SARS-CoV-2 RNA is generally detectable in upper respiratory specimens during the acute phase of infection. The lowest concentration of SARS-CoV-2 viral copies this assay can detect is 138 copies/mL. A negative result does not preclude SARS-Cov-2 infection and should not be used as the sole basis for treatment or other patient management decisions. A negative result may occur with  improper specimen collection/handling, submission of specimen other than nasopharyngeal swab, presence of viral mutation(s) within the areas targeted by this assay, and inadequate number of viral copies(<138 copies/mL). A negative result must be combined with clinical observations, patient history, and epidemiological information. The expected result is Negative.  Fact Sheet for Patients:  BloggerCourse.com  Fact Sheet for Healthcare Providers:  SeriousBroker.it  This test is no t yet approved or cleared by the [place] FDA and  has been authorized for detection and/or diagnosis of SARS-CoV-2 by FDA under an Emergency Use Authorization (EUA). This EUA will remain  in effect (meaning this test can be used) for the duration of the COVID-19 declaration under Section 564(b)(1)  of the Act, 21 U.S.C.section 360bbb-3(b)(1), unless the authorization is terminated  or revoked sooner.       Influenza A by PCR NEGATIVE NEGATIVE Final   Influenza B by PCR NEGATIVE NEGATIVE Final    Comment: (NOTE) The Xpert Xpress SARS-CoV-2/FLU/RSV plus assay is intended as an aid in the diagnosis of influenza from Nasopharyngeal swab specimens and should not be  used as a sole basis for treatment. Nasal washings and aspirates are unacceptable for Xpert Xpress SARS-CoV-2/FLU/RSV testing.  Fact Sheet for Patients: BloggerCourse.com  Fact Sheet for Healthcare Providers: SeriousBroker.it  This test is not yet approved or cleared by the [place] FDA and has been authorized for detection and/or diagnosis of SARS-CoV-2 by FDA under an Emergency Use Authorization (EUA). This EUA will remain in effect (meaning this test can be used) for the duration of the COVID-19 declaration under Section 564(b)(1) of the Act, 21 U.S.C. section 360bbb-3(b)(1), unless the authorization is terminated or revoked.     Resp Syncytial Virus by PCR NEGATIVE NEGATIVE Final    Comment: (NOTE) Fact Sheet for Patients: BloggerCourse.com  Fact Sheet for Healthcare Providers: SeriousBroker.it  This test is not yet approved or cleared by the [place] FDA and has been authorized for detection and/or diagnosis of SARS-CoV-2 by FDA under an Emergency Use Authorization (EUA). This EUA will remain in effect (meaning this test can be used) for the duration of the COVID-19 declaration under Section 564(b)(1) of the Act, 21 U.S.C. section 360bbb-3(b)(1), unless the authorization is terminated or revoked.  Performed at [facility], [address] [place], [place] [address]   Blood Culture (routine x 2)     Status: None (Preliminary result)   Collection Time: 03/10/23  7:03 PM   Specimen: BLOOD  Result Value Ref Range Status   Specimen Description   Final    BLOOD LEFT ANTECUBITAL Performed at [facility], [address], [place], [place] [address]    Special Requests   Final    BOTTLES DRAWN AEROBIC AND ANAEROBIC Blood Culture adequate volume Performed at [facility], [address], [place], [place]  [address]    Culture   Final    NO GROWTH 3 DAYS Performed at [facility], [address] [place], [place] [address]    Report Status PENDING  Incomplete  Blood Culture (routine x 2)     Status: None (Preliminary result)   Collection Time: 03/10/23  7:03 PM   Specimen: Right Antecubital; Blood  Result Value Ref Range Status   Specimen Description   Final    RIGHT ANTECUBITAL Performed at [facility], [address] [place], [place] [address]    Special Requests   Final    BOTTLES DRAWN AEROBIC AND ANAEROBIC Blood Culture adequate volume Performed at [facility], [address] [place], [place] [address]    Culture   Final    NO GROWTH 3 DAYS Performed at [facility], [address] [place], [place] [address]    Report Status PENDING  Incomplete  Respiratory (~20 pathogens) panel by PCR     Status: None   Collection Time: 03/10/23 10:13 PM   Specimen: Nasopharyngeal Swab; Respiratory  Result Value Ref Range Status   Adenovirus NOT DETECTED NOT DETECTED Final   Coronavirus 229E NOT DETECTED NOT DETECTED Final    Comment: (NOTE) The Coronavirus on the Respiratory Panel, DOES NOT test for the novel  Coronavirus (  2019 nCoV)    Coronavirus HKU1 NOT DETECTED NOT DETECTED Final   Coronavirus NL63 NOT DETECTED NOT DETECTED Final   Coronavirus OC43 NOT DETECTED NOT DETECTED Final   Metapneumovirus NOT DETECTED NOT DETECTED Final   Rhinovirus / Enterovirus NOT DETECTED NOT DETECTED Final   Influenza A NOT DETECTED NOT DETECTED Final   Influenza B NOT DETECTED NOT DETECTED Final   Parainfluenza Virus 1 NOT DETECTED NOT DETECTED Final   Parainfluenza Virus 2 NOT DETECTED NOT DETECTED Final   Parainfluenza Virus 3 NOT DETECTED NOT DETECTED Final   Parainfluenza Virus 4 NOT DETECTED NOT DETECTED Final   Respiratory Syncytial Virus NOT DETECTED NOT DETECTED Final   Bordetella pertussis NOT DETECTED NOT DETECTED Final   Bordetella  Parapertussis NOT DETECTED NOT DETECTED Final   Chlamydophila pneumoniae NOT DETECTED NOT DETECTED Final   Mycoplasma pneumoniae NOT DETECTED NOT DETECTED Final    Comment: Performed at [facility], [address], [place], [place] [address]  MRSA Next Gen by PCR, Nasal     Status: None   Collection Time: 03/11/23  4:53 AM   Specimen: Nasal Mucosa; Nasal Swab  Result Value Ref Range Status   MRSA by PCR Next Gen NOT DETECTED NOT DETECTED Final    Comment: (NOTE) The GeneXpert MRSA Assay (FDA approved for NASAL specimens only), is one component of a comprehensive MRSA colonization surveillance program. It is not intended to diagnose MRSA infection nor to guide or monitor treatment for MRSA infections. Test performance is not FDA approved in patients less than 40 years old. Performed at [facility], [address] [place], [place] [address]      Labs: Basic Metabolic Panel: Recent Labs  Lab 03/10/23 1903 03/11/23 0433  NA 133* 134*  K 3.1* 4.0  CL 100 105  CO2 23 21*  GLUCOSE 109* 104*  BUN 8 7  CREATININE 0.91 0.79  CALCIUM 8.8* 8.2*  MG 1.8 2.0  PHOS 1.8* 3.3   Liver Function Tests: Recent Labs  Lab 03/10/23 1903 03/11/23 0433  AST 29 25  ALT 21 19  ALKPHOS 43 44  BILITOT 0.7 0.8  PROT 7.8 6.9  ALBUMIN 3.5 3.1*   No results for input(s): "LIPASE", "AMYLASE" in the last 168 hours. No results for input(s): "AMMONIA" in the last 168 hours. CBC: Recent Labs  Lab 03/10/23 1903 03/11/23 0433 03/12/23 0444 03/13/23 0443  WBC 20.4* 24.4* 14.5* 9.0  NEUTROABS 18.4*  --   --   --   HGB 11.1* 10.3* 9.8* 10.6*  HCT 36.8 34.5* 31.8* 35.5*  MCV 83.1 83.7 83.0 83.7  PLT 270 223 226 236   Cardiac Enzymes: Recent Labs  Lab 03/10/23 1903  CKTOTAL 72   BNP: BNP (last 3 results) No results for input(s): "BNP" in the last 8760 hours.  ProBNP (last 3 results) No results for input(s): "PROBNP" in the last 8760 hours.  CBG: No results  for input(s): "GLUCAP" in the last 168 hours.     Signed:  Rhetta Mura MD   [facility] 03/13/2023, 8:56 AM

## 2023-03-14 LAB — CULTURE, BLOOD (ROUTINE X 2): Special Requests: ADEQUATE

## 2023-03-15 LAB — CULTURE, BLOOD (ROUTINE X 2): Culture: NO GROWTH

## 2023-03-31 NOTE — Telephone Encounter (Signed)
Per review of EPIC, patient is scheduled for an OV on 04/08/23 for breast and back pain, updated appt note to include TOC.   Routing to provider FYI.   Encounter closed.

## 2023-04-08 ENCOUNTER — Ambulatory Visit: Payer: Medicaid Other | Admitting: Radiology

## 2023-04-08 VITALS — BP 118/76 | Temp 98.8°F

## 2023-04-08 DIAGNOSIS — Z113 Encounter for screening for infections with a predominantly sexual mode of transmission: Secondary | ICD-10-CM | POA: Diagnosis not present

## 2023-04-08 DIAGNOSIS — N61 Mastitis without abscess: Secondary | ICD-10-CM | POA: Diagnosis not present

## 2023-04-08 NOTE — Progress Notes (Signed)
      Subjective: Emily Callahan is a 27 y.o. female here for STI screen. She was treated for chlamydia 10/2022, took all meds. Was admitted from ED for 4 days last months for sepsis secondary to breast abscess. Doing better now. All symptoms resolved.  Review of Systems  All other systems reviewed and are negative.   Past Medical History:  Diagnosis Date   Asthma    IBS (irritable bowel syndrome)    Migraine    Pregnant       Objective:  Today's Vitals   04/08/23 1509  BP: 118/76  Temp: 98.8 F (37.1 C)  TempSrc: Oral   There is no height or weight on file to calculate BMI.   Physical Exam Vitals and nursing note reviewed. Exam conducted with a chaperone present.  Constitutional:      Appearance: Normal appearance. She is normal weight.  Pulmonary:     Effort: Pulmonary effort is normal.  Chest:  Breasts:    Right: Normal. No swelling, mass, nipple discharge, skin change or tenderness.     Left: Normal. No swelling, mass, nipple discharge, skin change or tenderness.  Genitourinary:    General: Normal vulva.     Vagina: No vaginal discharge, erythema, bleeding or lesions.     Cervix: Normal. No cervical motion tenderness, discharge or friability.  Skin:    General: Skin is warm and dry.  Neurological:     Mental Status: She is alert.  Psychiatric:        Mood and Affect: Mood normal.        Thought Content: Thought content normal.        Judgment: Judgment normal.      Raynelle Fanning, CMA present for exam  Assessment:/Plan:   1. Screening for STDs (sexually transmitted diseases) - SURESWAB CT/NG/T. vaginalis  2. Cellulitis of right breast resolved   Will contact patient with results of testing completed today. Avoid intercourse until symptoms are resolved. Safe sex encouraged. Avoid the use of soaps or perfumed products in the peri area. Avoid tub baths and sitting in sweaty or wet clothing for prolonged periods of time.

## 2023-04-23 ENCOUNTER — Other Ambulatory Visit: Payer: Self-pay

## 2023-04-23 ENCOUNTER — Emergency Department (HOSPITAL_COMMUNITY): Payer: Medicaid Other

## 2023-04-23 ENCOUNTER — Emergency Department (HOSPITAL_COMMUNITY)
Admission: EM | Admit: 2023-04-23 | Discharge: 2023-04-23 | Disposition: A | Discharge status: Home / Self Care | Payer: Medicaid Other | Attending: Emergency Medicine | Admitting: Emergency Medicine

## 2023-04-23 ENCOUNTER — Encounter (HOSPITAL_COMMUNITY): Payer: Self-pay

## 2023-04-23 DIAGNOSIS — R1013 Epigastric pain: Secondary | ICD-10-CM | POA: Insufficient documentation

## 2023-04-23 DIAGNOSIS — R519 Headache, unspecified: Secondary | ICD-10-CM | POA: Diagnosis not present

## 2023-04-23 DIAGNOSIS — R1011 Right upper quadrant pain: Secondary | ICD-10-CM | POA: Diagnosis not present

## 2023-04-23 DIAGNOSIS — R112 Nausea with vomiting, unspecified: Secondary | ICD-10-CM | POA: Diagnosis present

## 2023-04-23 LAB — CBC WITH DIFFERENTIAL/PLATELET
Abs Immature Granulocytes: 0.05 10*3/uL (ref 0.00–0.07)
Basophils Absolute: 0 10*3/uL (ref 0.0–0.1)
Basophils Relative: 0 %
Eosinophils Absolute: 0 10*3/uL (ref 0.0–0.5)
Eosinophils Relative: 0 %
HCT: 43.6 % (ref 36.0–46.0)
Hemoglobin: 13.2 g/dL (ref 12.0–15.0)
Immature Granulocytes: 0 %
Lymphocytes Relative: 14 %
Lymphs Abs: 1.6 10*3/uL (ref 0.7–4.0)
MCH: 24.7 pg — ABNORMAL LOW (ref 26.0–34.0)
MCHC: 30.3 g/dL (ref 30.0–36.0)
MCV: 81.6 fL (ref 80.0–100.0)
Monocytes Absolute: 0.8 10*3/uL (ref 0.1–1.0)
Monocytes Relative: 7 %
Neutro Abs: 9.1 10*3/uL — ABNORMAL HIGH (ref 1.7–7.7)
Neutrophils Relative %: 79 %
Platelets: 334 10*3/uL (ref 150–400)
RBC: 5.34 MIL/uL — ABNORMAL HIGH (ref 3.87–5.11)
RDW: 16.5 % — ABNORMAL HIGH (ref 11.5–15.5)
WBC: 11.6 10*3/uL — ABNORMAL HIGH (ref 4.0–10.5)
nRBC: 0 % (ref 0.0–0.2)

## 2023-04-23 LAB — URINALYSIS, W/ REFLEX TO CULTURE (INFECTION SUSPECTED)
Bacteria, UA: NONE SEEN
Bilirubin Urine: NEGATIVE
Glucose, UA: NEGATIVE mg/dL
Hgb urine dipstick: NEGATIVE
Ketones, ur: NEGATIVE mg/dL
Leukocytes,Ua: NEGATIVE
Nitrite: NEGATIVE
Protein, ur: 30 mg/dL — AB
Specific Gravity, Urine: 1.019 (ref 1.005–1.030)
pH: 8 (ref 5.0–8.0)

## 2023-04-23 LAB — HCG, SERUM, QUALITATIVE: Preg, Serum: NEGATIVE

## 2023-04-23 LAB — COMPREHENSIVE METABOLIC PANEL
ALT: 35 U/L (ref 0–44)
AST: 37 U/L (ref 15–41)
Albumin: 4.2 g/dL (ref 3.5–5.0)
Alkaline Phosphatase: 52 U/L (ref 38–126)
Anion gap: 12 (ref 5–15)
BUN: 6 mg/dL (ref 6–20)
CO2: 26 mmol/L (ref 22–32)
Calcium: 9.6 mg/dL (ref 8.9–10.3)
Chloride: 102 mmol/L (ref 98–111)
Creatinine, Ser: 0.78 mg/dL (ref 0.44–1.00)
GFR, Estimated: >60 mL/min (ref >60)
Glucose, Bld: 94 mg/dL (ref 70–99)
Potassium: 3.8 mmol/L (ref 3.5–5.1)
Sodium: 140 mmol/L (ref 135–145)
Total Bilirubin: 0.4 mg/dL (ref 0.3–1.2)
Total Protein: 9.1 g/dL — ABNORMAL HIGH (ref 6.5–8.1)

## 2023-04-23 LAB — LIPASE, BLOOD: Lipase: 23 U/L (ref 11–51)

## 2023-04-23 MED ORDER — ONDANSETRON 4 MG PO TBDP: ORAL_TABLET | ORAL | 2 refills | Status: DC

## 2023-04-23 MED ORDER — ONDANSETRON 4 MG PO TBDP: ORAL_TABLET | ORAL | 2 refills | Status: AC

## 2023-04-23 MED ORDER — FENTANYL CITRATE PF 50 MCG/ML IJ SOSY
50.0000 ug | PREFILLED_SYRINGE | Freq: Once | INTRAMUSCULAR | Status: AC
  Administered 2023-04-23: 50 ug via INTRAVENOUS
  Filled 2023-04-23: qty 1

## 2023-04-23 MED ORDER — NAPROXEN 500 MG PO TABS: ORAL_TABLET | ORAL | 0 refills | Status: DC

## 2023-04-23 MED ORDER — METOCLOPRAMIDE HCL 5 MG/ML IJ SOLN
5.0000 mg | Freq: Once | INTRAMUSCULAR | Status: AC
  Administered 2023-04-23: 5 mg via INTRAVENOUS
  Filled 2023-04-23: qty 2

## 2023-04-23 MED ORDER — FAMOTIDINE IN NACL 20-0.9 MG/50ML-% IV SOLN
20.0000 mg | Freq: Once | INTRAVENOUS | Status: AC
  Administered 2023-04-23: 20 mg via INTRAVENOUS
  Filled 2023-04-23: qty 50

## 2023-04-23 MED ORDER — SODIUM CHLORIDE 0.9 % IV BOLUS
1000.0000 mL | Freq: Once | INTRAVENOUS | Status: AC
  Administered 2023-04-23: 1000 mL via INTRAVENOUS

## 2023-04-23 MED ORDER — DIPHENHYDRAMINE HCL 50 MG/ML IJ SOLN
12.5000 mg | Freq: Once | INTRAMUSCULAR | Status: AC
  Administered 2023-04-23: 12.5 mg via INTRAVENOUS
  Filled 2023-04-23: qty 1

## 2023-04-23 NOTE — ED Provider Notes (Incomplete)
[place] EMERGENCY DEPARTMENT AT [facility] Provider Note   CSN: [id number] Arrival date & time: 04/23/23  1403     History {Add pertinent medical, surgical, social history, OB history to HPI:1} Chief Complaint  Patient presents with   Emesis   Chest Pain    Emily Callahan is a 27 y.o. female who presented emergency department with chief complaint of intractable vomiting.  Patient states that her symptoms began yesterday.  She has been unable to hold down any food or fluids.  Patient states the last time she vomited like this was when she was pregnant 6 years ago.  She also complains of epigastric abdominal pain, pain in her chest from vomiting and severe frontal headache.  Patient states that her headache is similar to her typical migraine headaches however she never has vomiting with migraines.  She denies any current light or sound sensitivity.  Denies urinary symptoms or back pain.   Emesis Chest Pain Associated symptoms: vomiting        Home Medications Prior to Admission medications   Medication Sig Start Date End Date Taking? Authorizing Provider  loratadine (CLARITIN) 10 MG tablet Take 1 tablet (10 mg total) by mouth daily. Patient not taking: Reported on 09/07/2018 10/28/16 12/25/20  Brock Bad, MD      Allergies    Patient has no known allergies.    Review of Systems   Review of Systems  Cardiovascular:  Positive for chest pain.  Gastrointestinal:  Positive for vomiting.    Physical Exam Updated Vital Signs BP 125/88 (BP Location: Left Arm)   Pulse 91   Temp 98.3 F (36.8 C) (Oral)   Resp 15   Ht 5\' 4"  (1.626 m)   Wt 70 kg   LMP 03/08/2023 (Exact Date)   SpO2 100%   BMI 26.49 kg/m  Physical Exam Vitals and nursing note reviewed.  Constitutional:      General: She is not in acute distress.    Appearance: She is well-developed. She is not diaphoretic.     Comments: tearful  HENT:     Head: Normocephalic and atraumatic.      Mouth/Throat:     Mouth: Oropharynx is clear and moist.  Eyes:     General: No scleral icterus.    Extraocular Movements: EOM normal.     Conjunctiva/sclera: Conjunctivae normal.     Pupils: Pupils are equal, round, and reactive to light.     Comments: No horizontal, vertical or rotational nystagmus  Neck:     Comments: Full active and passive ROM without pain No midline or paraspinal tenderness No nuchal rigidity or meningeal signs Cardiovascular:     Rate and Rhythm: Normal rate and regular rhythm.  Pulmonary:     Effort: Pulmonary effort is normal. No respiratory distress.     Breath sounds: Normal breath sounds. No wheezing or rales.  Abdominal:     General: Bowel sounds are normal.     Palpations: Abdomen is soft.     Tenderness: There is abdominal tenderness in the right upper quadrant and epigastric area. There is no guarding or rebound.  Musculoskeletal:        General: Normal range of motion.     Cervical back: Normal range of motion and neck supple.  Lymphadenopathy:     Cervical: No cervical adenopathy.  Skin:    General: Skin is warm and dry.     Findings: No rash.  Neurological:     Mental Status: She  is alert and oriented to person, place, and time.     Cranial Nerves: No cranial nerve deficit.     Motor: No abnormal muscle tone.     Coordination: Coordination normal.     Deep Tendon Reflexes: Reflexes are normal and symmetric.     Comments: Mental Status:  Alert, oriented, thought content appropriate. Speech fluent without evidence of aphasia. Able to follow 2 step commands without difficulty.  Cranial Nerves:  II:  Peripheral visual fields grossly normal, pupils equal, round, reactive to light III,IV, VI: ptosis not present, extra-ocular motions intact bilaterally  V,VII: smile symmetric, facial light touch sensation equal VIII: hearing grossly normal bilaterally  IX,X: midline uvula rise  XI: bilateral shoulder shrug equal and strong XII: midline tongue  extension  Motor:  5/5 in upper and lower extremities bilaterally including strong and equal grip strength and dorsiflexion/plantar flexion Sensory: Pinprick and light touch normal in all extremities.  Deep Tendon Reflexes: 2+ and symmetric  Cerebellar: normal finger-to-nose with bilateral upper extremities Gait: normal gait and balance CV: distal pulses palpable throughout   Psychiatric:        Mood and Affect: Mood and affect normal.        Behavior: Behavior normal.        Thought Content: Thought content normal.        Judgment: Judgment normal.     ED Results / Procedures / Treatments   Labs (all labs ordered are listed, but only abnormal results are displayed) Labs Reviewed - No data to display  EKG None  Radiology No results found.  Procedures Procedures  {Document cardiac monitor, telemetry assessment procedure when appropriate:1}  Medications Ordered in ED Medications - No data to display  ED Course/ Medical Decision Making/ A&P Clinical Course as of 04/23/23 1850  Fri Apr 23, 2023  1629 WBC(!): 11.6 [AH]  1810 Urinalysis, w/ Reflex to Culture (Infection Suspected) -Urine, Clean Catch(!) [AH]  1810 Comprehensive metabolic panel(!) [AH]  1810 hCG, serum, qualitative [AH]  1810 Lipase, blood [AH]  1850 I have reviewed the patient's labs.  No acute findings suspect elevated white count due to acute phase reaction [AH]  1850 Patient reevaluated, serial abdominal exam is benign.  She has no nausea, headache resolved, no active vomiting, tolerating fluids. [AH]    Clinical Course User Index [AH] Arthor Captain, PA-C   {   Click here for ABCD2, HEART and other calculatorsREFRESH Note before signing :1}                              Medical Decision Making Amount and/or Complexity of Data Reviewed Labs: ordered. Decision-making details documented in ED Course. Radiology: ordered.  Risk Prescription drug management.   ***  {Document critical care time  when appropriate:1} {Document review of labs and clinical decision tools ie heart score, Chads2Vasc2 etc:1}  {Document your independent review of radiology images, and any outside records:1} {Document your discussion with family members, caretakers, and with consultants:1} {Document social determinants of health affecting pt's care:1} {Document your decision making why or why not admission, treatments were needed:1} Final Clinical Impression(s) / ED Diagnoses Final diagnoses:  None    Rx / DC Orders ED Discharge Orders     None

## 2023-04-23 NOTE — Discharge Instructions (Signed)
Continue frequent small sips (10-20 ml) of clear liquids every 5-10 minutes. Gatorade or powerade are good options. Avoid milk, orange juice, and grape juice for now. . Once you have not had further vomiting with the small sips for 4 hours, you may begin to drink larger volumes of fluids at a time and try a bland diet which may include saltine crackers, applesauce, breads, pastas, bananas, bland chicken. If you continues to vomit despite medication, return to the ED for repeat evaluation. Otherwise, follow up with your doctor in 2-3 days for a re-check.  

## 2023-04-23 NOTE — ED Triage Notes (Signed)
Patient presented to ER for vomiting and chest pain. Patient states this has been going on since Tuesday but getting worse. A&OX4, ambulatory to triage.

## 2023-05-26 ENCOUNTER — Ambulatory Visit: Payer: Medicaid Other | Admitting: Radiology

## 2023-05-30 ENCOUNTER — Encounter (HOSPITAL_COMMUNITY): Payer: Self-pay

## 2023-05-30 ENCOUNTER — Emergency Department (HOSPITAL_COMMUNITY)
Admission: EM | Admit: 2023-05-30 | Discharge: 2023-05-30 | Disposition: A | Discharge status: Home / Self Care | Payer: Medicaid Other | Attending: Emergency Medicine | Admitting: Emergency Medicine

## 2023-05-30 ENCOUNTER — Emergency Department (HOSPITAL_COMMUNITY): Payer: Medicaid Other

## 2023-05-30 ENCOUNTER — Other Ambulatory Visit: Payer: Self-pay

## 2023-05-30 DIAGNOSIS — S0990XA Unspecified injury of head, initial encounter: Secondary | ICD-10-CM | POA: Diagnosis present

## 2023-05-30 DIAGNOSIS — M25572 Pain in left ankle and joints of left foot: Secondary | ICD-10-CM | POA: Diagnosis not present

## 2023-05-30 DIAGNOSIS — W108XXA Fall (on) (from) other stairs and steps, initial encounter: Secondary | ICD-10-CM | POA: Insufficient documentation

## 2023-05-30 DIAGNOSIS — R42 Dizziness and giddiness: Secondary | ICD-10-CM

## 2023-05-30 DIAGNOSIS — R55 Syncope and collapse: Secondary | ICD-10-CM | POA: Insufficient documentation

## 2023-05-30 DIAGNOSIS — J45909 Unspecified asthma, uncomplicated: Secondary | ICD-10-CM | POA: Insufficient documentation

## 2023-05-30 LAB — BASIC METABOLIC PANEL
Anion gap: 10 (ref 5–15)
BUN: 10 mg/dL (ref 6–20)
CO2: 24 mmol/L (ref 22–32)
Calcium: 8.8 mg/dL — ABNORMAL LOW (ref 8.9–10.3)
Chloride: 106 mmol/L (ref 98–111)
Creatinine, Ser: 0.71 mg/dL (ref 0.44–1.00)
GFR, Estimated: >60 mL/min (ref >60)
Glucose, Bld: 76 mg/dL (ref 70–99)
Potassium: 3.6 mmol/L (ref 3.5–5.1)
Sodium: 140 mmol/L (ref 135–145)

## 2023-05-30 LAB — CBC
HCT: 42.1 % (ref 36.0–46.0)
Hemoglobin: 12.7 g/dL (ref 12.0–15.0)
MCH: 25.1 pg — ABNORMAL LOW (ref 26.0–34.0)
MCHC: 30.2 g/dL (ref 30.0–36.0)
MCV: 83.4 fL (ref 80.0–100.0)
Platelets: 275 10*3/uL (ref 150–400)
RBC: 5.05 MIL/uL (ref 3.87–5.11)
RDW: 17.2 % — ABNORMAL HIGH (ref 11.5–15.5)
WBC: 11.5 10*3/uL — ABNORMAL HIGH (ref 4.0–10.5)
nRBC: 0 % (ref 0.0–0.2)

## 2023-05-30 LAB — URINALYSIS, ROUTINE W REFLEX MICROSCOPIC
Bacteria, UA: NONE SEEN
Bilirubin Urine: NEGATIVE
Glucose, UA: NEGATIVE mg/dL
Ketones, ur: 5 mg/dL — AB
Leukocytes,Ua: NEGATIVE
Nitrite: NEGATIVE
Protein, ur: 30 mg/dL — AB
RBC / HPF: >50 RBC/hpf (ref 0–5)
Specific Gravity, Urine: 1.024 (ref 1.005–1.030)
pH: 7 (ref 5.0–8.0)

## 2023-05-30 LAB — CBG MONITORING, ED: Glucose-Capillary: 83 mg/dL (ref 70–99)

## 2023-05-30 LAB — HCG, SERUM, QUALITATIVE: Preg, Serum: NEGATIVE

## 2023-05-30 MED ORDER — DIPHENHYDRAMINE HCL 50 MG/ML IJ SOLN
50.0000 mg | Freq: Once | INTRAMUSCULAR | Status: AC
  Administered 2023-05-30: 50 mg via INTRAVENOUS
  Filled 2023-05-30: qty 1

## 2023-05-30 MED ORDER — ACETAMINOPHEN 500 MG PO TABS
1000.0000 mg | ORAL_TABLET | Freq: Once | ORAL | Status: AC
  Administered 2023-05-30: 1000 mg via ORAL
  Filled 2023-05-30: qty 2

## 2023-05-30 MED ORDER — MECLIZINE HCL 25 MG PO TABS
25.0000 mg | ORAL_TABLET | Freq: Three times a day (TID) | ORAL | 0 refills | Status: AC | PRN
Start: 2023-05-30 — End: ?

## 2023-05-30 MED ORDER — MECLIZINE HCL 25 MG PO TABS
25.0000 mg | ORAL_TABLET | Freq: Once | ORAL | Status: AC
  Administered 2023-05-30: 25 mg via ORAL
  Filled 2023-05-30: qty 1

## 2023-05-30 MED ORDER — ACETAMINOPHEN 500 MG PO TABS: 1000.0000 mg | ORAL_TABLET | Freq: Four times a day (QID) | ORAL | 0 refills | Status: DC | PRN

## 2023-05-30 MED ORDER — METOCLOPRAMIDE HCL 5 MG/ML IJ SOLN
10.0000 mg | Freq: Once | INTRAMUSCULAR | Status: AC
  Administered 2023-05-30: 10 mg via INTRAVENOUS
  Filled 2023-05-30: qty 2

## 2023-05-30 MED ORDER — SODIUM CHLORIDE 0.9 % IV BOLUS
1000.0000 mL | Freq: Once | INTRAVENOUS | Status: AC
  Administered 2023-05-30: 1000 mL via INTRAVENOUS

## 2023-05-30 NOTE — ED Notes (Signed)
Patient given sandwich and beverage, tolerated well

## 2023-05-30 NOTE — ED Triage Notes (Signed)
Pt states feeling dizzy and fell down several stairs around 1140 today.  Pt reports loss of consciousness Pt endorses headache and left ankle pain

## 2023-05-30 NOTE — Discharge Instructions (Signed)
You have been evaluated for your symptoms.  Fortunately no concerning finding were noted on today's exam.  Take meclizine as needed for dizziness or nausea.  Take tylenol for headache.  Follow up with your doctor for further care.

## 2023-05-30 NOTE — ED Provider Notes (Signed)
[place] EMERGENCY DEPARTMENT AT [facility] Provider Note   CSN: [id number] Arrival date & time: 05/30/23  1413     History {Add pertinent medical, surgical, social history, OB history to HPI:1} Chief Complaint  Patient presents with   Dizziness   Fall   Loss of Consciousness    Emily Callahan is a 27 y.o. female.  The history is provided by the patient and medical records. No language interpreter was used.  Dizziness Associated symptoms: syncope   Fall  Loss of Consciousness Associated symptoms: dizziness      27 year old female significant history of asthma, IBS, migraine, presenting with complaints of a fall.  Patient states for the past 3 days she has gradual onset of throbbing headache that felt similar to prior migraine.  Describes headache as a pressure sensation around her forehead radiates to the back of her head with associated light and sound sensitivity.  Today approximately 4 hours ago she was in the process of getting ready to go to work, she was walking down the steps and felt dizzy and subsequently had a brief syncopal episode when she fell down the stairs.  Dad did try to assist her to get up.  She now complaining of throbbing worsening headache, mild tenderness to her left ankle, and overall not feeling well.  She is currently on her menstruation and states menstrual period has been quite irregular lately.  She is eating and drinking fine.  She mention she does have history of episodic bouts of dizziness sometimes brought on by head position change that happen sporadically.  Patient also mentioned that her migraine headache has not been well manage with naproxen alone.  Home Medications Prior to Admission medications   Medication Sig Start Date End Date Taking? Authorizing Provider  naproxen (NAPROSYN) 500 MG tablet Take 1 tablet by mouth at onset of migraine symptoms.  You may repeat 1 dose in 12 hours.  Do not take this medication for more than 2  days in a row. 04/23/23   Arthor Captain, PA-C  ondansetron (ZOFRAN-ODT) 4 MG disintegrating tablet 4mg  ODT q4 hours prn nausea/vomit 04/23/23   Harris, Abigail, PA-C  loratadine (CLARITIN) 10 MG tablet Take 1 tablet (10 mg total) by mouth daily. Patient not taking: Reported on 09/07/2018 10/28/16 12/25/20  Brock Bad, MD      Allergies    Patient has no known allergies.    Review of Systems   Review of Systems  Cardiovascular:  Positive for syncope.  Neurological:  Positive for dizziness.  All other systems reviewed and are negative.   Physical Exam Updated Vital Signs Pulse 72   Temp 98 F (36.7 C) (Oral)   Resp 14   Ht 5\' 4"  (1.626 m)   Wt 65.8 kg   BMI 24.89 kg/m  Physical Exam Vitals and nursing note reviewed.  Constitutional:      General: She is not in acute distress.    Appearance: She is well-developed.  HENT:     Head: Normocephalic and atraumatic.     Comments: Tenderness to posterior scalp on palpation but no bruising no crepitus noted.  No raccoon's eyes, no Battle sign and no midface tenderness. Eyes:     Extraocular Movements: Extraocular movements intact.     Conjunctiva/sclera: Conjunctivae normal.     Pupils: Pupils are equal, round, and reactive to light.     Comments: No significant nystagmus  Cardiovascular:     Rate and Rhythm: Normal rate and regular rhythm.  Pulses: Normal pulses.     Heart sounds: Normal heart sounds.  Pulmonary:     Effort: Pulmonary effort is normal.  Abdominal:     Palpations: Abdomen is soft.     Tenderness: There is no abdominal tenderness.  Musculoskeletal:        General: Tenderness (Left ankle: Mild tenderness to lateral malleoli region with full range of motion no deformity no bruising and no swelling.) present.     Cervical back: Normal range of motion and neck supple.  Skin:    Findings: No rash.  Neurological:     Mental Status: She is alert and oriented to person, place, and time.     GCS: GCS eye  subscore is 4. GCS verbal subscore is 5. GCS motor subscore is 6.     Cranial Nerves: Cranial nerves 2-12 are intact.     Sensory: Sensation is intact.     Motor: Motor function is intact.     Coordination: Coordination is intact.     Gait: Gait is intact.  Psychiatric:        Mood and Affect: Mood normal.     ED Results / Procedures / Treatments   Labs (all labs ordered are listed, but only abnormal results are displayed) Labs Reviewed  BASIC METABOLIC PANEL - Abnormal; Notable for the following components:      Result Value   Calcium 8.8 (*)    All other components within normal limits  CBC - Abnormal; Notable for the following components:   WBC 11.5 (*)    MCH 25.1 (*)    RDW 17.2 (*)    All other components within normal limits  URINALYSIS, ROUTINE W REFLEX MICROSCOPIC - Abnormal; Notable for the following components:   Hgb urine dipstick MODERATE (*)    Ketones, ur 5 (*)    Protein, ur 30 (*)    All other components within normal limits  HCG, SERUM, QUALITATIVE  CBG MONITORING, ED    EKG None  Date: 05/30/2023  Rate: 83  Rhythm: normal sinus rhythm  QRS Axis: normal  Intervals: normal  ST/T Wave abnormalities: normal  Conduction Disutrbances: none  Narrative Interpretation: borderline prolonged QT  Old EKG Reviewed: No significant changes noted    Radiology CT Head Wo Contrast  Result Date: 05/30/2023 CLINICAL DATA:  Head trauma, moderate-severe EXAM: CT HEAD WITHOUT CONTRAST TECHNIQUE: Contiguous axial images were obtained from the base of the skull through the vertex without intravenous contrast. RADIATION DOSE REDUCTION: This exam was performed according to the departmental dose-optimization program which includes automated exposure control, adjustment of the mA and/or kV according to patient size and/or use of iterative reconstruction technique. COMPARISON:  CT head 05/03/2013. FINDINGS: Brain: No evidence of acute infarction, hemorrhage, hydrocephalus,  extra-axial collection or mass lesion/mass effect. Vascular: No hyperdense vessel or unexpected calcification. Skull: No acute fracture. Sinuses/Orbits: Clear sinuses.  No acute orbital findings. Other: No mastoid effusions. IMPRESSION: No evidence of acute intracranial abnormality. Electronically Signed   By: Feliberto Harts M.D.   On: 05/30/2023 16:35    Procedures Procedures  {Document cardiac monitor, telemetry assessment procedure when appropriate:1}  Medications Ordered in ED Medications  sodium chloride 0.9 % bolus 1,000 mL (0 mLs Intravenous Stopped 05/30/23 1730)  diphenhydrAMINE (BENADRYL) injection 50 mg (50 mg Intravenous Given 05/30/23 1604)  metoCLOPramide (REGLAN) injection 10 mg (10 mg Intravenous Given 05/30/23 1604)  acetaminophen (TYLENOL) tablet 1,000 mg (1,000 mg Oral Given 05/30/23 1607)  meclizine (ANTIVERT) tablet 25 mg (25  mg Oral Given 05/30/23 1738)    ED Course/ Medical Decision Making/ A&P   {   Click here for ABCD2, HEART and other calculatorsREFRESH Note before signing :1}                              Medical Decision Making Amount and/or Complexity of Data Reviewed Labs: ordered. Radiology: ordered.  Risk OTC drugs. Prescription drug management.   Pulse 72   Temp 98 F (36.7 C) (Oral)   Resp 14   Ht 5\' 4"  (1.626 m)   Wt 65.8 kg   BMI 24.89 kg/m   31:36 PM  27 year old female significant history of asthma, IBS, migraine, presenting with complaints of a fall.  Patient states for the past 3 days she has gradual onset of throbbing headache that felt similar to prior migraine.  Describes headache as a pressure sensation around her forehead radiates to the back of her head with associated light and sound sensitivity.  Today approximately 4 hours ago she was in the process of getting ready to go to work, she was walking down the steps and felt dizzy and subsequently had a brief syncopal episode when she fell down the stairs.  Dad did try to assist her to  get up.  She now complaining of throbbing worsening headache, mild tenderness to her left ankle, and overall not feeling well.  She is currently on her menstruation and states menstrual period has been quite irregular lately.  She is eating and drinking fine.  She mention she does have history of episodic bouts of dizziness sometimes brought on by head position change that happen sporadically.  On exam, patient appears slightly uncomfortable but nontoxic in appearance.  She does have some tenderness to her occipital scalp without any obvious signs of injury.  She also has some mild tenderness noted to her left ankle on palpation but full range of motion and tenderness is minimal.  No other significant injury noted.  She does not have any focal neurodeficit and her gait is steady.  -Labs ordered, independently viewed and interpreted by me.  Labs remarkable for UA showing hgb, likely 2/2 current menstruation.  WBC 11.5, similar to prior.  Normal H&H. Electrolytes are reassuring. Preg test negative -The patient was maintained on a cardiac monitor.  I personally viewed and interpreted the cardiac monitored which showed an underlying rhythm of: NSR -Imaging independently viewed and interpreted by me and I agree with radiologist's interpretation.  Result remarkable for head CT unremarkable -This patient presents to the ED for concern of headache, this involves an extensive number of treatment options, and is a complaint that carries with it a high risk of complications and morbidity.  The differential diagnosis includes tension headache, clustered headache, migraine, anemia, electrolytes imbalance, BPPV, intracranial injury,  -Co morbidities that complicate the patient evaluation includes migraine -Treatment includes migraine cocktail, IVF, meclizine -Reevaluation of the patient after these medicines showed that the patient improved -PCP office notes or outside notes reviewed -Escalation to  admission/observation considered: patients feels much better, is comfortable with discharge, and will follow up with PCP -Prescription medication considered, patient comfortable with meclizine -Social Determinant of Health considered  6:45 PM Headache without concerning feature.  Doubt stroke, space-occupying lesion, meningitis, subarachnoid hemorrhage, other concerning problem.  Dizziness likely BPPV as it is positional.  Low suspicion for posterior circulation stroke or any central cause.  After receiving treatment, patient felt better able to  ambulate.  Labs overall reassuring.  Will discharge home with supportive care, return precaution given.   {Document critical care time when appropriate:1} {Document review of labs and clinical decision tools ie heart score, Chads2Vasc2 etc:1}  {Document your independent review of radiology images, and any outside records:1} {Document your discussion with family members, caretakers, and with consultants:1} {Document social determinants of health affecting pt's care:1} {Document your decision making why or why not admission, treatments were needed:1} Final Clinical Impression(s) / ED Diagnoses Final diagnoses:  None    Rx / DC Orders ED Discharge Orders     None

## 2023-05-30 NOTE — ED Notes (Signed)
Pt ambulated in hallway and back to 15 without assist

## 2023-09-08 ENCOUNTER — Encounter (HOSPITAL_COMMUNITY): Payer: Self-pay

## 2023-09-08 ENCOUNTER — Emergency Department (HOSPITAL_COMMUNITY)
Admission: EM | Admit: 2023-09-08 | Discharge: 2023-09-08 | Disposition: A | Discharge status: Home / Self Care | Payer: Medicaid Other | Attending: Emergency Medicine | Admitting: Emergency Medicine

## 2023-09-08 ENCOUNTER — Other Ambulatory Visit: Payer: Self-pay

## 2023-09-08 ENCOUNTER — Emergency Department (HOSPITAL_COMMUNITY): Payer: Medicaid Other

## 2023-09-08 DIAGNOSIS — X501XXA Overexertion from prolonged static or awkward postures, initial encounter: Secondary | ICD-10-CM | POA: Insufficient documentation

## 2023-09-08 DIAGNOSIS — S93402A Sprain of unspecified ligament of left ankle, initial encounter: Secondary | ICD-10-CM | POA: Diagnosis not present

## 2023-09-08 DIAGNOSIS — S99912A Unspecified injury of left ankle, initial encounter: Secondary | ICD-10-CM | POA: Diagnosis present

## 2023-09-08 MED ORDER — ACETAMINOPHEN 500 MG PO TABS: 1000.0000 mg | ORAL_TABLET | Freq: Four times a day (QID) | ORAL | 0 refills | Status: AC | PRN

## 2023-09-08 MED ORDER — ACETAMINOPHEN 500 MG PO TABS
1000.0000 mg | ORAL_TABLET | Freq: Once | ORAL | Status: AC
  Administered 2023-09-08: 1000 mg via ORAL
  Filled 2023-09-08: qty 2

## 2023-09-08 MED ORDER — NAPROXEN 500 MG PO TABS
ORAL_TABLET | ORAL | 0 refills | Status: DC
Start: 2023-09-08 — End: 2023-10-02

## 2023-09-08 MED ORDER — IBUPROFEN 200 MG PO TABS
600.0000 mg | ORAL_TABLET | Freq: Once | ORAL | Status: AC
  Administered 2023-09-08: 600 mg via ORAL
  Filled 2023-09-08: qty 3

## 2023-09-08 NOTE — ED Provider Notes (Signed)
 [place] EMERGENCY DEPARTMENT AT [facility] Provider Note   CSN: [id number] Arrival date & time: 09/08/23  9167     History  Chief Complaint  Patient presents with   Ankle Pain    Emily Callahan is a 28 y.o. female.  Patient presents to the emergency department for evaluation of left ankle injury.  Patient sustained a twisting injury earlier this morning.  She reports pain in her left ankle that radiates into her foot and shoots up her leg.  She reports that her toes are tingling.  No treatments prior to arrival.  She has been using crutches that she has.  She denies head injuries.  No knee or hip pain.       Home Medications Prior to Admission medications   Medication Sig Start Date End Date Taking? Authorizing Provider  acetaminophen  (TYLENOL ) 500 MG tablet Take 2 tablets (1,000 mg total) by mouth every 6 (six) hours as needed for moderate pain or headache. 05/30/23   Nivia Colon, PA-C  meclizine  (ANTIVERT ) 25 MG tablet Take 1 tablet (25 mg total) by mouth 3 (three) times daily as needed for dizziness or nausea. 05/30/23   Nivia Colon, PA-C  naproxen  (NAPROSYN ) 500 MG tablet Take 1 tablet by mouth at onset of migraine symptoms.  You may repeat 1 dose in 12 hours.  Do not take this medication for more than 2 days in a row. 04/23/23   Harris, Abigail, PA-C  ondansetron  (ZOFRAN -ODT) 4 MG disintegrating tablet 4mg  ODT q4 hours prn nausea/vomit 04/23/23   Harris, Abigail, PA-C  loratadine  (CLARITIN ) 10 MG tablet Take 1 tablet (10 mg total) by mouth daily. Patient not taking: Reported on 09/07/2018 10/28/16 12/25/20  Rudy Carlin LABOR, MD      Allergies    Patient has no known allergies.    Review of Systems   Review of Systems  Physical Exam Updated Vital Signs BP (!) 129/92 (BP Location: Left Arm)   Pulse 99   Temp 98.3 F (36.8 C) (Oral)   Resp 18   Ht 5' 4 (1.626 m)   Wt 76.7 kg   SpO2 98%   BMI 29.01 kg/m   Physical Exam Vitals and nursing note reviewed.   Constitutional:      Appearance: She is well-developed.  HENT:     Head: Normocephalic and atraumatic.  Eyes:     Conjunctiva/sclera: Conjunctivae normal.  Cardiovascular:     Pulses:          Dorsalis pedis pulses are 2+ on the right side and 2+ on the left side.       Posterior tibial pulses are 2+ on the right side and 2+ on the left side.  Musculoskeletal:        General: Tenderness present.     Cervical back: Normal range of motion and neck supple.     Left knee: No bony tenderness. Normal range of motion. No tenderness.     Left lower leg: No swelling, tenderness or bony tenderness.     Left ankle: Swelling present. Tenderness present over the lateral malleolus and medial malleolus. No proximal fibula tenderness. Decreased range of motion.     Left foot: Normal range of motion. No tenderness or bony tenderness.  Skin:    General: Skin is warm and dry.  Neurological:     Mental Status: She is alert.     Comments: Distal motor, sensation, and vascular intact.  Sensation present distally.     ED Results /  Procedures / Treatments   Labs (all labs ordered are listed, but only abnormal results are displayed) Labs Reviewed - No data to display  EKG None  Radiology DG Ankle Complete Left Result Date: 09/08/2023 CLINICAL DATA:  Twisted ankle with ankle pain and swelling. EXAM: LEFT ANKLE COMPLETE - 3+ VIEW COMPARISON:  Ankle radiographs dated 07/31/2022. FINDINGS: There is no evidence of fracture, dislocation, or joint effusion. There is no evidence of arthropathy or other focal bone abnormality. There is soft tissue swelling around the ankle. IMPRESSION: No acute osseous injury. Electronically Signed   By: Norman Hopper M.D.   On: 09/08/2023 09:24    Procedures Procedures    Medications Ordered in ED Medications  ibuprofen  (ADVIL ) tablet 600 mg (600 mg Oral Given 09/08/23 0955)  acetaminophen  (TYLENOL ) tablet 1,000 mg (1,000 mg Oral Given 09/08/23 9044)    ED Course/  Medical Decision Making/ A&P    Patient seen and examined. History obtained directly from patient. Work-up including labs, imaging, EKG ordered in triage, if performed, were reviewed.    Labs/EKG: None ordered  Imaging: Independently reviewed and interpreted.  This included: X-ray of the left ankle, agree negative for fracture.  Medications/Fluids: Ordered: P.o. Tylenol  and ibuprofen   Most recent vital signs reviewed and are as follows: BP (!) 129/92 (BP Location: Left Arm)   Pulse 99   Temp 98.3 F (36.8 C) (Oral)   Resp 18   Ht 5' 4 (1.626 m)   Wt 76.7 kg   SpO2 98%   BMI 29.01 kg/m   Initial impression: Left ankle sprain without knee injury.  Will provide with ASO.  Patient already has crutches.  Home treatment plan: Discussed RICE protocol, OTC meds  Return instructions discussed with patient: New or worsening symptoms  Follow-up instructions discussed with patient: Orthopedics in 1 week if not improving                                Medical Decision Making Amount and/or Complexity of Data Reviewed Radiology: ordered.  Risk OTC drugs.   Patient with worsening injury of left ankle.  Lower extremity appears neurovascularly intact.  No evidence of knee dislocation or proximal fibula fracture.        Final Clinical Impression(s) / ED Diagnoses Final diagnoses:  Sprain of left ankle, unspecified ligament, initial encounter    Rx / DC Orders ED Discharge Orders          Ordered    acetaminophen  (TYLENOL ) 500 MG tablet  Every 6 hours PRN        09/08/23 0951    naproxen  (NAPROSYN ) 500 MG tablet        09/08/23 0951              Desiderio Chew, PA-C 09/08/23 1006    Levander Houston, MD 09/13/23 [phone]

## 2023-09-08 NOTE — Discharge Instructions (Signed)
 Please read and follow all provided instructions.  Your diagnoses today include:  1. Sprain of left ankle, unspecified ligament, initial encounter     Tests performed today include: An x-ray of your ankle - does NOT show any broken bones Vital signs. See below for your results today.   Medications prescribed:  Naproxen  - anti-inflammatory pain medication Do not exceed 500mg  naproxen  every 12 hours, take with food  You have been prescribed an anti-inflammatory medication or NSAID. Take with food. Take smallest effective dose for the shortest duration needed for your pain. Stop taking if you experience stomach pain or vomiting.   Tylenol   Take any prescribed medications only as directed.  Home care instructions:  Follow any educational materials contained in this packet Follow R.I.C.E. Protocol: R - rest your injury  I  - use ice on injury without applying directly to skin C - compress injury with bandage or splint E - elevate the injury as much as possible  Follow-up instructions: Please follow-up with your primary care provider or the provided orthopedic (bone specialist) if you continue to have significant pain or trouble walking in 1 week. In this case you may have a severe sprain that requires further care.   Return instructions:  Please return if your toes are numb or tingling, appear gray or blue, or you have severe pain (also elevate leg and loosen splint or wrap) Please return to the Emergency Department if you experience worsening symptoms.  Please return if you have any other emergent concerns.  Additional Information:  Your vital signs today were: BP (!) 129/92 (BP Location: Left Arm)   Pulse 99   Temp 98.3 F (36.8 C) (Oral)   Resp 18   Ht 5' 4 (1.626 m)   Wt 76.7 kg   SpO2 98%   BMI 29.01 kg/m  If your blood pressure (BP) was elevated above 135/85 this visit, please have this repeated by your doctor within one month. -------------- Your caregiver has  diagnosed you as suffering from an ankle sprain. Ankle sprain occurs when the ligaments that hold the ankle joint together are stretched or torn. It may take 4 to 6 weeks to heal.  For Activity: If prescribed crutches, use crutches with non-weight bearing for the first few days. Then, you may walk on your ankle as the pain allows, or as instructed. Start gradually with weight bearing on the affected ankle. Once you can walk pain free, then try jogging. When you can run forwards, then you can try moving side-to-side. If you cannot walk without crutches in one week, you need a re-check. --------------

## 2023-09-08 NOTE — ED Triage Notes (Signed)
 Pt was chasing her sisters dog at 3 am and fell, twisting her left ankle. Pt has pain and swelling.

## 2023-09-09 ENCOUNTER — Encounter: Payer: Self-pay | Admitting: Internal Medicine

## 2023-09-09 ENCOUNTER — Ambulatory Visit: Payer: Medicaid Other | Admitting: Internal Medicine

## 2023-09-09 VITALS — BP 130/74 | HR 93 | Temp 98.2°F | Ht 64.0 in | Wt 169.2 lb

## 2023-09-09 DIAGNOSIS — S99912A Unspecified injury of left ankle, initial encounter: Secondary | ICD-10-CM | POA: Diagnosis not present

## 2023-09-09 DIAGNOSIS — L853 Xerosis cutis: Secondary | ICD-10-CM

## 2023-09-09 DIAGNOSIS — R87610 Atypical squamous cells of undetermined significance on cytologic smear of cervix (ASC-US): Secondary | ICD-10-CM | POA: Diagnosis not present

## 2023-09-09 DIAGNOSIS — J4520 Mild intermittent asthma, uncomplicated: Secondary | ICD-10-CM

## 2023-09-09 MED ORDER — TRIAMCINOLONE ACETONIDE 0.1 % EX CREA
1.0000 | TOPICAL_CREAM | Freq: Two times a day (BID) | CUTANEOUS | 1 refills | Status: AC
Start: 2023-09-09 — End: ?

## 2023-09-09 NOTE — Patient Instructions (Signed)
 Continue Naproxen as prescribed  Use ice packs  Elevate ankle and use ankle brace , crutches

## 2023-09-09 NOTE — Progress Notes (Signed)
 [facility]IMARY [facility]ARE [facility] [address] [place] [address] Dept: [phone] Dept Fax: [phone]  New Patient Office Visit  Subjective:   Emily Callahan July 06, 1996 09/09/2023  Chief Complaint  Patient presents with   Establish Care   Breast Problem   Ankle Injury    HPI: Emily Callahan presents today to establish care at [facility] at [employer]. Introduced to publishing rights manager role and practice setting.  All questions answered.  Concerns: See below   Discussed the use of AI scribe software for clinical note transcription with the patient, who gave verbal consent to proceed.  History of Present Illness   The patient, with a history of asthma and recent hospitalization for cellulitis in the right breast progressing to pneumonia in July 2024, presents to establish care. The patient reports no recent asthma attacks in the past three years. The primary concern is itching and dryness in the right breast. The patient describes the skin as very dry and notes that moisturizing lotions, such as Aveeno, seem to dry up quicker than usual. There is no reported redness, warmth, rash, or changes in soaps or detergents.  Additionally, the patient reports a recent ankle sprain resulting from a fall caused by a dog 1 day ago. The patient fell forward with the ankle twisted outward. Despite an ER visit and x-rays showing no fractures, the patient reports persistent numbness in four out of five toes and pain on the sides of the ankle. The patient is currently taking naproxen  for pain management and has been icing the ankle and keeping it elevated.       The following portions of the patient's history were reviewed and updated as appropriate: past medical history, past surgical history, family history, social history, allergies, medications, and problem list.   Patient Active Problem List   Diagnosis Date Noted   ASCUS of cervix with  negative high risk HPV 09/09/2023   Asthma    Past Medical History:  Diagnosis Date   Asthma    Breast pain, right 03/10/2023   Cellulitis of right breast 03/11/2023   IBS (irritable bowel syndrome)    Migraine    Pregnant    Sepsis (HCC) 03/10/2023   Past Surgical History:  Procedure Laterality Date   MOUTH SURGERY     Wisdom teeth pulled    WISDOM TOOTH EXTRACTION     Family History  Problem Relation Age of Onset   Hypertension Mother    Cancer Mother    Stroke Mother    Heart attack Father 24    Current Outpatient Medications:    meclizine  (ANTIVERT ) 25 MG tablet, Take 1 tablet (25 mg total) by mouth 3 (three) times daily as needed for dizziness or nausea., Disp: 30 tablet, Rfl: 0   naproxen  (NAPROSYN ) 500 MG tablet, Take 1 tablet by mouth at onset of migraine symptoms.  You may repeat 1 dose in 12 hours.  Do not take this medication for more than 2 days in a row., Disp: 30 tablet, Rfl: 0   triamcinolone  cream (KENALOG ) 0.1 %, Apply 1 Application topically 2 (two) times daily., Disp: 45 g, Rfl: 1   acetaminophen  (TYLENOL ) 500 MG tablet, Take 2 tablets (1,000 mg total) by mouth every 6 (six) hours as needed for moderate pain (pain score 4-6)., Disp: 30 tablet, Rfl: 0   ondansetron  (ZOFRAN -ODT) 4 MG disintegrating tablet, 4mg  ODT q4 hours prn nausea/vomit (Patient not taking: Reported on 09/09/2023), Disp: 10 tablet, Rfl: 2 No Known  Allergies  ROS: A complete ROS was performed with pertinent positives/negatives noted in the HPI. The remainder of the ROS are negative.   Objective:   Today's Vitals   09/09/23 0810  BP: 130/74  Pulse: 93  Temp: 98.2 F (36.8 C)  TempSrc: Temporal  SpO2: 98%  Weight: 169 lb 3.2 oz (76.7 kg)  Height: 5' 4 (1.626 m)    GENERAL: Well-appearing, in NAD. Well nourished.  SKIN: Pink, warm and dry. No rash or lesions. Dry skin present to lateral aspect of R. Breast. No palpable masses.  RESPIRATORY: Respirations even and non-labored.   CARDIAC:  Peripheral pulses 2+ bilaterally. Cap refill < 2sec.  MSK: Muscle tone and strength appropriate for age. Swelling to lateral aspect of left ankle. TTP with ROM. Pain upon palpation to ATF ligament region.  EXTREMITIES: Without clubbing, cyanosis. PSYCH/MENTAL STATUS: Alert, oriented x 3. Cooperative, appropriate mood and affect.   Health Maintenance Due  Topic Date Due   Hepatitis C Screening  Never done    No results found for any visits on 09/09/23.  Assessment & Plan:  Assessment and Plan     Asthma  Well Controlled without medication at this time  Continue to monitor   ASCUS on pap smear  Continue follow up with OBGYN as scheduled   Dry Skin Dermatitis Dry, itchy skin on the right breast with no signs of infection or rash. -Apply a small amount of steroid cream twice daily. -Continue using Aveeno for eczema, applying it on top of the steroid cream once it's well rubbed in.  Left Ankle Injury Recent fall resulting in a sprained right ankle with numbness in the toes. X-ray showed no fractures, but the patient is still experiencing pain and numbness. -Refer to orthopedics for further evaluation and potential MRI. -Continue naproxen  as prescribed. -Advise to ice the ankle and keep it elevated to reduce swelling. -Keep ankle brace on during the day, can be removed at night for sleep.  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 -Provide a work note recommending patient return to work on Monday with restrictions to accommodate her ankle injury. 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 No weight bearing until evaluated by ortho.   Follow-up Patient has a history of abnormal Pap smear and is due for a repeat Pap smear in February. Is followed by OBGYN.  -Schedule a follow-up appointment in three months to review Pap smear results and conduct fasting lab work  if necessary.      Orders Placed This Encounter  Procedures   Ambulatory referral to Orthopedic Surgery    Referral Priority:   Routine    Referral Type:   Surgical    Referral Reason:   Specialty Services Required    Requested Specialty:   Orthopedic Surgery    Number of Visits Requested:   1   Meds ordered this encounter  Medications   triamcinolone  cream (KENALOG ) 0.1 %    Sig: Apply 1 Application topically 2 (two) times daily.    Dispense:  45 g    Refill:  1    Supervising Provider:   SEBASTIAN BEVERLEY NOVAK [[id number]]    Return in about 3 months (around 12/08/2023) for Chronic Condition follow up, health maintenance .   Rosina Senters, FNP

## 2023-10-02 ENCOUNTER — Encounter (HOSPITAL_COMMUNITY): Payer: Self-pay

## 2023-10-02 ENCOUNTER — Emergency Department (HOSPITAL_COMMUNITY)
Admission: EM | Admit: 2023-10-02 | Discharge: 2023-10-02 | Disposition: A | Discharge status: Home / Self Care | Payer: Medicaid Other | Attending: Emergency Medicine | Admitting: Emergency Medicine

## 2023-10-02 ENCOUNTER — Emergency Department (HOSPITAL_COMMUNITY): Payer: Medicaid Other

## 2023-10-02 DIAGNOSIS — R1030 Lower abdominal pain, unspecified: Secondary | ICD-10-CM | POA: Insufficient documentation

## 2023-10-02 DIAGNOSIS — Z3A01 Less than 8 weeks gestation of pregnancy: Secondary | ICD-10-CM | POA: Diagnosis not present

## 2023-10-02 DIAGNOSIS — O219 Vomiting of pregnancy, unspecified: Secondary | ICD-10-CM | POA: Diagnosis present

## 2023-10-02 LAB — URINALYSIS, ROUTINE W REFLEX MICROSCOPIC
Bilirubin Urine: NEGATIVE
Glucose, UA: NEGATIVE mg/dL
Hgb urine dipstick: NEGATIVE
Ketones, ur: NEGATIVE mg/dL
Nitrite: NEGATIVE
Protein, ur: NEGATIVE mg/dL
Specific Gravity, Urine: 1.014 (ref 1.005–1.030)
pH: 6 (ref 5.0–8.0)

## 2023-10-02 LAB — COMPREHENSIVE METABOLIC PANEL
ALT: 17 U/L (ref 0–44)
AST: 16 U/L (ref 15–41)
Albumin: 3.5 g/dL (ref 3.5–5.0)
Alkaline Phosphatase: 33 U/L — ABNORMAL LOW (ref 38–126)
Anion gap: 8 (ref 5–15)
BUN: 6 mg/dL (ref 6–20)
CO2: 24 mmol/L (ref 22–32)
Calcium: 8.8 mg/dL — ABNORMAL LOW (ref 8.9–10.3)
Chloride: 103 mmol/L (ref 98–111)
Creatinine, Ser: 0.67 mg/dL (ref 0.44–1.00)
GFR, Estimated: >60 mL/min (ref >60)
Glucose, Bld: 101 mg/dL — ABNORMAL HIGH (ref 70–99)
Potassium: 3.6 mmol/L (ref 3.5–5.1)
Sodium: 135 mmol/L (ref 135–145)
Total Bilirubin: 0.6 mg/dL (ref 0.0–1.2)
Total Protein: 7.6 g/dL (ref 6.5–8.1)

## 2023-10-02 LAB — CBC
HCT: 36.4 % (ref 36.0–46.0)
Hemoglobin: 11.4 g/dL — ABNORMAL LOW (ref 12.0–15.0)
MCH: 26 pg (ref 26.0–34.0)
MCHC: 31.3 g/dL (ref 30.0–36.0)
MCV: 82.9 fL (ref 80.0–100.0)
Platelets: 244 10*3/uL (ref 150–400)
RBC: 4.39 MIL/uL (ref 3.87–5.11)
RDW: 16 % — ABNORMAL HIGH (ref 11.5–15.5)
WBC: 7.9 10*3/uL (ref 4.0–10.5)
nRBC: 0 % (ref 0.0–0.2)

## 2023-10-02 LAB — HCG, QUANTITATIVE, PREGNANCY: hCG, Beta Chain, Quant, S: 18772 m[IU]/mL — ABNORMAL HIGH (ref <5)

## 2023-10-02 LAB — HCG, SERUM, QUALITATIVE: Preg, Serum: POSITIVE — AB

## 2023-10-02 LAB — LIPASE, BLOOD: Lipase: 32 U/L (ref 11–51)

## 2023-10-02 LAB — POC URINE PREG, ED: Preg Test, Ur: POSITIVE — AB

## 2023-10-02 MED ORDER — VITAMIN B-6 25 MG PO TABS: 25.0000 mg | ORAL_TABLET | Freq: Three times a day (TID) | ORAL | 0 refills | Status: AC | PRN

## 2023-10-02 MED ORDER — ONDANSETRON 4 MG PO TBDP
4.0000 mg | ORAL_TABLET | Freq: Once | ORAL | Status: AC | PRN
  Administered 2023-10-02: 4 mg via ORAL
  Filled 2023-10-02: qty 1

## 2023-10-02 NOTE — ED Provider Notes (Signed)
[place] EMERGENCY DEPARTMENT AT [facility] Provider Note   CSN: [id number] Arrival date & time: 10/02/23  4098     History  Chief Complaint  Patient presents with   Abdominal Pain    Emily Callahan is a 28 y.o. female with history of IBS, presents with concern for lower abdominal pain, nausea and vomiting for the past 2 days.  She reports normal bowel movements, denies any dysuria, hematuria, increased frequency.  Denies any sick contacts.  Last menstrual period 08/21/2023.   Abdominal Pain      Home Medications Prior to Admission medications   Medication Sig Start Date End Date Taking? Authorizing Provider  pyridOXINE (VITAMIN B6) 25 MG tablet Take 1 tablet (25 mg total) by mouth every 8 (eight) hours as needed (For nausea and vomiting). 10/02/23  Yes Arabella Merles, PA-C  acetaminophen (TYLENOL) 500 MG tablet Take 2 tablets (1,000 mg total) by mouth every 6 (six) hours as needed for moderate pain (pain score 4-6). 09/08/23   Renne Crigler, PA-C  meclizine (ANTIVERT) 25 MG tablet Take 1 tablet (25 mg total) by mouth 3 (three) times daily as needed for dizziness or nausea. 05/30/23   Fayrene Helper, PA-C  ondansetron (ZOFRAN-ODT) 4 MG disintegrating tablet 4mg  ODT q4 hours prn nausea/vomit Patient not taking: Reported on 09/09/2023 04/23/23   Arthor Captain, PA-C  triamcinolone cream (KENALOG) 0.1 % Apply 1 Application topically 2 (two) times daily. 09/09/23   Salvatore Decent, FNP  loratadine (CLARITIN) 10 MG tablet Take 1 tablet (10 mg total) by mouth daily. Patient not taking: Reported on 09/07/2018 10/28/16 12/25/20  Brock Bad, MD      Allergies    Patient has no known allergies.    Review of Systems   Review of Systems  Gastrointestinal:  Positive for abdominal pain.    Physical Exam Updated Vital Signs BP 139/89 (BP Location: Left Arm)   Pulse 69   Temp 98.8 F (37.1 C) (Oral)   Resp 16   Ht 5\' 4"  (1.626 m)   Wt 76.2 kg   LMP 08/21/2023 (Exact Date)    SpO2 100%   BMI 28.84 kg/m  Physical Exam Vitals and nursing note reviewed.  Constitutional:      General: She is not in acute distress.    Appearance: She is well-developed.     Comments: Well-appearing, not actively vomiting in the room  HENT:     Head: Normocephalic and atraumatic.  Eyes:     Conjunctiva/sclera: Conjunctivae normal.  Cardiovascular:     Rate and Rhythm: Normal rate and regular rhythm.     Heart sounds: No murmur heard. Pulmonary:     Effort: Pulmonary effort is normal. No respiratory distress.     Breath sounds: Normal breath sounds.  Abdominal:     Palpations: Abdomen is soft.     Tenderness: There is no abdominal tenderness.     Comments: Abdomen soft and nontender to palpation, no rebound or guarding  Genitourinary:    Comments: Declines pelvic exam Musculoskeletal:        General: No swelling.     Cervical back: Neck supple.  Skin:    General: Skin is warm and dry.     Capillary Refill: Capillary refill takes less than 2 seconds.  Neurological:     Mental Status: She is alert.  Psychiatric:        Mood and Affect: Mood normal.     ED Results / Procedures / Treatments   Labs (  all labs ordered are listed, but only abnormal results are displayed) Labs Reviewed  COMPREHENSIVE METABOLIC PANEL - Abnormal; Notable for the following components:      Result Value   Glucose, Bld 101 (*)    Calcium 8.8 (*)    Alkaline Phosphatase 33 (*)    All other components within normal limits  CBC - Abnormal; Notable for the following components:   Hemoglobin 11.4 (*)    RDW 16.0 (*)    All other components within normal limits  URINALYSIS, ROUTINE W REFLEX MICROSCOPIC - Abnormal; Notable for the following components:   APPearance HAZY (*)    Leukocytes,Ua SMALL (*)    Bacteria, UA RARE (*)    All other components within normal limits  HCG, SERUM, QUALITATIVE - Abnormal; Notable for the following components:   Preg, Serum POSITIVE (*)    All other  components within normal limits  HCG, QUANTITATIVE, PREGNANCY - Abnormal; Notable for the following components:   hCG, Beta Chain, Quant, S 21,308 (*)    All other components within normal limits  POC URINE PREG, ED - Abnormal; Notable for the following components:   Preg Test, Ur POSITIVE (*)    All other components within normal limits  LIPASE, BLOOD    EKG None  Radiology US OB Transvaginal Result Date: 10/02/2023 CLINICAL DATA:  Pregnancy, pelvic pain EXAM: OBSTETRIC <14 WK [place] AND TRANSVAGINAL OB US TECHNIQUE: Both transabdominal and transvaginal ultrasound examinations were performed for complete evaluation of the gestation as well as the maternal uterus, adnexal regions, and pelvic cul-de-sac. Transvaginal technique was performed to assess early pregnancy. COMPARISON:  None Available. FINDINGS: Intrauterine gestational sac: Single Yolk sac:  Visualized. Embryo:  Visualized. Cardiac Activity: Visualized. Heart Rate: 167 bpm CRL:  7.1 mm   6 w   4 d                  [place] EDC: 05/23/2024 Subchorionic hemorrhage: Possible small subchorionic hemorrhage of the lower aspect measuring 1.1 cm. Maternal uterus/adnexae: Unremarkable. Trace free fluid in the low pelvis. IMPRESSION: 1. Single intrauterine gestation at sonographic gestational age of [redacted] weeks, 4 days. Fetal heart rate 167 beats per minute. EDD 05/23/2024. 2.  Possible small subchorionic hemorrhage measuring 1.1 cm. Electronically Signed   By: Jearld Lesch M.D.   On: 10/02/2023 14:04   US OB Comp < 14 Wks Result Date: 10/02/2023 CLINICAL DATA:  Pregnancy, pelvic pain EXAM: OBSTETRIC <14 WK [place] AND TRANSVAGINAL OB US TECHNIQUE: Both transabdominal and transvaginal ultrasound examinations were performed for complete evaluation of the gestation as well as the maternal uterus, adnexal regions, and pelvic cul-de-sac. Transvaginal technique was performed to assess early pregnancy. COMPARISON:  None Available. FINDINGS: Intrauterine gestational sac:  Single Yolk sac:  Visualized. Embryo:  Visualized. Cardiac Activity: Visualized. Heart Rate: 167 bpm CRL:  7.1 mm   6 w   4 d                  [place] EDC: 05/23/2024 Subchorionic hemorrhage: Possible small subchorionic hemorrhage of the lower aspect measuring 1.1 cm. Maternal uterus/adnexae: Unremarkable. Trace free fluid in the low pelvis. IMPRESSION: 1. Single intrauterine gestation at sonographic gestational age of [redacted] weeks, 4 days. Fetal heart rate 167 beats per minute. EDD 05/23/2024. 2.  Possible small subchorionic hemorrhage measuring 1.1 cm. Electronically Signed   By: Jearld Lesch M.D.   On: 10/02/2023 14:04    Procedures Procedures    Medications Ordered in ED Medications  ondansetron (ZOFRAN-ODT) disintegrating tablet 4 mg (4 mg Oral Given 10/02/23 0900)    ED Course/ Medical Decision Making/ A&P                                 Medical Decision Making Amount and/or Complexity of Data Reviewed Labs: ordered. Radiology: ordered.  Risk OTC drugs. Prescription drug management.     Differential diagnosis includes but is not limited to Cholelithiasis, cholangitis, choledocholithiasis, peptic ulcer, gastritis, gastroenteritis, appendicitis, IBS, IBD, DKA, nephrolithiasis, UTI, pyelonephritis, pancreatitis, diverticulitis, mesenteric ischemia, abdominal aortic aneurysm, small bowel obstruction, volvulus, testicular torsion in males, ovarian torsion and pregnancy related concerns in females of childbearing age    ED Course:  Patient overall well-appearing, stable vital signs.  She was given Zofran in triage for nausea.  Reports no further episodes of emesis while being in the ER.  Abdomen soft nontender. I Ordered, and personally interpreted labs.  The pertinent results include:   Beta-hCG at 18,772 CMP without any elevation in LFTs, normal creatinine.  Electrolytes within normal limits aside from a slightly low calcium at 8.8 Lipase within normal limits CBC without leukocytosis.   Hemoglobin slightly low at 11.4 Urinalysis with small amount of leukocytes, no nitrites.  Squamous epithelial cells present  OB ultrasound showed the patient has a intrauterine pregnancy at 6 weeks 4 days.  There is also a very small subchorionic hemorrhage noted approximately 1.1 cm, although patient denies any vaginal bleeding. Suspect this is the cause of patient's nausea and vomiting given her abdomen is soft nontender, and other labs unremarkable. Low concern for any other acute pathology at this time. Patient states she has a OB/GYN she can follow-up with.  Will also treat for asymptomatic bacteriuria in pregnancy given small mount of leukocytes in her urine.  Impression: Intrauterine pregnancy, estimated 6 weeks 4 days Asymptomatic bacteriuria in pregnancy  Disposition:  The patient was discharged home with instructions to follow-up with her OB/GYN within the next 2 weeks for further routine prenatal care.  Take Keflex as prescribed.  Was instructed to take a daily prenatal vitamin.  Was also prescribed vitamin B6 to use as needed for nausea and vomiting. Return precautions given.  Imaging Studies ordered: I ordered imaging studies including transvaginal and abdominal ultrasound I independently visualized the imaging with scope of interpretation limited to determining acute life threatening conditions related to emergency care. Imaging showed IUP estimated at 6 weeks 4 days.  small subchorionic hemorrhage noted approximately 1.1 cm. I agree with the radiologist interpretation               Final Clinical Impression(s) / ED Diagnoses Final diagnoses:  Less than [redacted] weeks gestation of pregnancy  Nausea and vomiting during pregnancy    Rx / DC Orders ED Discharge Orders          Ordered    pyridOXINE (VITAMIN B6) 25 MG tablet  Every 8 hours PRN        10/02/23 1424              Arabella Merles, PA-C 10/02/23 1453    Virgina Norfolk, DO 10/02/23 1458

## 2023-10-02 NOTE — Discharge Instructions (Addendum)
You are pregnant, estimated to be about 6 weeks and 4 days.  The results of your ultrasound are as below:  "IMPRESSION: 1. Single intrauterine gestation at sonographic gestational age of [redacted] weeks, 4 days. Fetal heart rate 167 beats per minute. EDD 05/23/2024.   2.  Possible small subchorionic hemorrhage measuring 1.1 cm."  Your urine shows a possible urinary tract infection.  You have been prescribed Keflex. Take this antibiotic 2 times a day for the next 5 days. Take the full course of your antibiotic even if you start feeling better. Antibiotics may cause you to have diarrhea.  Please start taking a daily prenatal vitamin.  You have been prescribed pyridoxine (vitamin B6) to help with the nausea and vomiting.  You may take 1 to every 8 hours as needed for nausea or vomiting.  If you are having pain, you may take Tylenol.  Please do not use any NSAID medication such as ibuprofen or naproxen.  There are many options for quick evaluation and management of certain GYN issues that do not require a long wait time or big bill from the emergency department.   If you experience vaginal bleeding, begin passing clots, or have any other pregnancy related concerns, please go directly to: Center for [facility] at [facility] [address] [place], [place], [place] [address] You do not need an appointment.   Please establish care with a OBGYN. You can make an appointment to see a provider:   Center for [facility] at [facility]  [address]  [phone]   Center for [facility] at [facility]  [address] [employer]  [phone]   Center for [facility] at [facility] [address] [place]  [phone]   Center for [facility] at [facility]  [address]  [phone]   Center for [employer] [facility]  [place]  [phone]   If you already have an established OB/GYN provider in  the area you can make an appointment with them as well.  Return the ER if you have any new or concerning symptoms.

## 2023-10-02 NOTE — ED Triage Notes (Signed)
Patient reports abd pain, lower, lower back pain  Vomiting x 2 days Pain 6/10 Says it wakes her out of sleep Denies sick contacts States unsure if pregnant or not Lmp 08/21/23

## 2023-12-06 ENCOUNTER — Encounter: Admitting: Internal Medicine

## 2023-12-06 ENCOUNTER — Telehealth: Payer: Self-pay

## 2023-12-06 NOTE — Telephone Encounter (Signed)
 LVM for pt to cb. Pt may need to be seen by OBGYN for concerns (STD testing, bleeding, and physical).

## 2023-12-06 NOTE — Progress Notes (Deleted)
 Subjective:   Emily Callahan Mar 26, 1996  12/06/2023   CC: No chief complaint on file.   HPI: Emily Callahan is a 28 y.o. female who presents for a routine health maintenance exam.  Labs collected at time of visit.    HEALTH SCREENINGS: - Pap smear: {Blank single:19197::"pap done","not applicable","up to date","done elsewhere"} - Mammogram (40+): {Blank single:19197::"Up to date","Ordered today","Not applicable","Refused","Done elsewhere"}  - Colonoscopy (45+): {Blank single:19197::"Up to date","Ordered today","Not applicable","Refused","Done elsewhere"}  - Bone Density (65+): {Blank single:19197::"Up to date","Ordered today","Not applicable","Refused","Done elsewhere"}  - Lung CA screening with low-dose CT:  {Blank single:19197::"Up to date","Ordered today","Not applicable","Declined","Done elsewhere"} Adults age 65-80 who are current cigarette smokers or quit within the last 15 years. Must have 20 pack year history.   Depression and Anxiety Screen done today and results listed below:     09/09/2023    8:10 AM  Depression screen PHQ 2/9  Decreased Interest 0  Down, Depressed, Hopeless 0  PHQ - 2 Score 0       No data to display          IMMUNIZATIONS: - Tdap: Tetanus vaccination status reviewed: {tetanus status:315746}. - HPV: {Blank single:19197::"Up to date","Administered today","Not applicable","Refused","Given elsewhere"} - Influenza: {Blank single:19197::"Up to date","Administered today","Postponed to flu season","Refused","Given elsewhere"} - Pneumovax: {Blank single:19197::"Up to date","Administered today","Not applicable","Refused","Given elsewhere"} - Prevnar 20: {Blank single:19197::"Up to date","Administered today","Not applicable","Refused","Given elsewhere"} - Zostavax (50+): {Blank single:19197::"Up to date","Administered today","Not applicable","Refused","Given elsewhere"}   Past medical history, surgical history, medications, allergies, family history  and social history reviewed with patient today and changes made to appropriate areas of the chart.   Past Medical History:  Diagnosis Date   Asthma    Breast pain, right 03/10/2023   Cellulitis of right breast 03/11/2023   IBS (irritable bowel syndrome)    Migraine    Pregnant    Sepsis (HCC) 03/10/2023    Past Surgical History:  Procedure Laterality Date   MOUTH SURGERY     Wisdom teeth "pulled"    WISDOM TOOTH EXTRACTION      Current Outpatient Medications on File Prior to Visit  Medication Sig   acetaminophen (TYLENOL) 500 MG tablet Take 2 tablets (1,000 mg total) by mouth every 6 (six) hours as needed for moderate pain (pain score 4-6).   meclizine (ANTIVERT) 25 MG tablet Take 1 tablet (25 mg total) by mouth 3 (three) times daily as needed for dizziness or nausea.   ondansetron (ZOFRAN-ODT) 4 MG disintegrating tablet 4mg  ODT q4 hours prn nausea/vomit (Patient not taking: Reported on 09/09/2023)   pyridOXINE (VITAMIN B6) 25 MG tablet Take 1 tablet (25 mg total) by mouth every 8 (eight) hours as needed (For nausea and vomiting).   triamcinolone cream (KENALOG) 0.1 % Apply 1 Application topically 2 (two) times daily.   [DISCONTINUED] loratadine (CLARITIN) 10 MG tablet Take 1 tablet (10 mg total) by mouth daily. (Patient not taking: Reported on 09/07/2018)   No current facility-administered medications on file prior to visit.    No Known Allergies   Social History   Socioeconomic History   Marital status: Single    Spouse name: Not on file   Number of children: Not on file   Years of education: Not on file   Highest education level: Not on file  Occupational History   Not on file  Tobacco Use   Smoking status: Every Day   Smokeless tobacco: Never  Vaping Use   Vaping status: Never Used  Substance and Sexual Activity   Alcohol use:  Yes    Comment: socially   Drug use: Yes    Types: Marijuana   Sexual activity: Yes    Partners: Male    Birth control/protection:  Condom    Comment: menarche 28yo, sexual debut 28yo  Other Topics Concern   Not on file  Social History Narrative   Not on file   Social Drivers of Health   Financial Resource Strain: Not on file  Food Insecurity: No Food Insecurity (03/11/2023)   Hunger Vital Sign    Worried About Running Out of Food in the Last Year: Never true    Ran Out of Food in the Last Year: Never true  Transportation Needs: No Transportation Needs (03/11/2023)   PRAPARE - Administrator, Civil Service (Medical): No    Lack of Transportation (Non-Medical): No  Physical Activity: Not on file  Stress: Not on file  Social Connections: Not on file  Intimate Partner Violence: Not At Risk (03/11/2023)   Humiliation, Afraid, Rape, and Kick questionnaire    Fear of Current or Ex-Partner: No    Emotionally Abused: No    Physically Abused: No    Sexually Abused: No   Social History   Tobacco Use  Smoking Status Every Day  Smokeless Tobacco Never   Social History   Substance and Sexual Activity  Alcohol Use Yes   Comment: socially    Family History  Problem Relation Age of Onset   Hypertension Mother    Cancer Mother    Stroke Mother    Heart attack Father 65     ROS: Denies fever, fatigue, unexplained weight loss/gain, hearing or vision changes, cardiac or respiratory complaints. Denies neurological deficits, musculoskeletal complaints, gastrointestinal or genitourinary complaints, mental health complaints, and skin changes.   Objective:   There were no vitals filed for this visit.  GENERAL APPEARANCE: Well-appearing, in NAD. Well nourished.  SKIN: Pink, warm and dry. Turgor normal. No rash, lesion, ulceration, or ecchymoses. Hair evenly distributed.  HEENT: HEAD: Normocephalic.  EYES: PERRLA. EOMI. Lids intact w/o defect. Sclera white, Conjunctiva pink w/o exudate.  EARS: External ear w/o redness, swelling, masses or lesions. EAC clear. TM's intact, translucent w/o bulging, appropriate  landmarks visualized. Appropriate acuity to conversational tones.  NOSE: Septum midline w/o deformity. Nares patent, mucosa pink and non-inflamed w/o drainage. No sinus tenderness.  THROAT: Uvula midline. Oropharynx clear. Tonsils non-inflamed w/o exudate ***. Oral mucosa pink and moist.  NECK: Supple, Trachea midline. Full ROM w/o pain or tenderness. No lymphadenopathy. Thyroid non-tender w/o enlargement or palpable masses.  BREASTS: Breasts pendulous, symmetrical, and w/o palpable masses. Nipples everted and w/o discharge. No rash or skin retraction. No axillary or supraclavicular lymphadenopathy.  RESPIRATORY: Chest wall symmetrical w/o masses. Respirations even and non-labored. Breath sounds clear to auscultation bilaterally. No wheezes, rales, rhonchi, or crackles. CARDIAC: S1, S2 present, regular rate and rhythm. No gallops, murmurs, rubs, or clicks. No carotid bruits. Capillary refill <2 seconds. Peripheral pulses 2+ bilaterally. GI: Abdomen soft w/o distention. Normoactive bowel sounds. No palpable masses or tenderness. No guarding or rebound tenderness. Liver and spleen w/o tenderness or enlargement. No CVA tenderness.  GU: *** External genitalia without erythema, lesions, or masses. No lymphadenopathy. Vaginal mucosa pink and moist without exudate, lesions, or ulcerations. Cervix pink without discharge. Cervical os closed. Uterus and adnexae palpable, not enlarged, and w/o tenderness. No palpable masses.  MSK: Muscle tone and strength appropriate for age, w/o atrophy or abnormal movement.  EXTREMITIES: Active ROM intact, w/o tenderness,  crepitus, or contracture. No obvious joint deformities or effusions. No clubbing, edema, or cyanosis.  NEUROLOGIC: CN's II-XII intact. Motor strength symmetrical with no obvious weakness. No sensory deficits. DTR's 2+ symmetric bilaterally. Steady, even gait.  PSYCH/MENTAL STATUS: Alert, oriented x 3. Cooperative, appropriate mood and affect.   Chaperoned by  Mary Sella CMA ***  Results for orders placed or performed during the hospital encounter of 10/02/23  Lipase, blood   Collection Time: 10/02/23  8:53 AM  Result Value Ref Range   Lipase 32 11 - 51 U/L  Comprehensive metabolic panel   Collection Time: 10/02/23  8:53 AM  Result Value Ref Range   Sodium 135 135 - 145 mmol/L   Potassium 3.6 3.5 - 5.1 mmol/L   Chloride 103 98 - 111 mmol/L   CO2 24 22 - 32 mmol/L   Glucose, Bld 101 (H) 70 - 99 mg/dL   BUN 6 6 - 20 mg/dL   Creatinine, Ser 7.25 0.44 - 1.00 mg/dL   Calcium 8.8 (L) 8.9 - 10.3 mg/dL   Total Protein 7.6 6.5 - 8.1 g/dL   Albumin 3.5 3.5 - 5.0 g/dL   AST 16 15 - 41 U/L   ALT 17 0 - 44 U/L   Alkaline Phosphatase 33 (L) 38 - 126 U/L   Total Bilirubin 0.6 0.0 - 1.2 mg/dL   GFR, Estimated >36 >64 mL/min   Anion gap 8 5 - 15  CBC   Collection Time: 10/02/23  8:53 AM  Result Value Ref Range   WBC 7.9 4.0 - 10.5 K/uL   RBC 4.39 3.87 - 5.11 MIL/uL   Hemoglobin 11.4 (L) 12.0 - 15.0 g/dL   HCT 40.3 47.4 - 25.9 %   MCV 82.9 80.0 - 100.0 fL   MCH 26.0 26.0 - 34.0 pg   MCHC 31.3 30.0 - 36.0 g/dL   RDW 56.3 (H) 87.5 - 64.3 %   Platelets 244 150 - 400 K/uL   nRBC 0.0 0.0 - 0.2 %  Urinalysis, Routine w reflex microscopic -Urine, Clean Catch   Collection Time: 10/02/23  8:53 AM  Result Value Ref Range   Color, Urine YELLOW YELLOW   APPearance HAZY (A) CLEAR   Specific Gravity, Urine 1.014 1.005 - 1.030   pH 6.0 5.0 - 8.0   Glucose, UA NEGATIVE NEGATIVE mg/dL   Hgb urine dipstick NEGATIVE NEGATIVE   Bilirubin Urine NEGATIVE NEGATIVE   Ketones, ur NEGATIVE NEGATIVE mg/dL   Protein, ur NEGATIVE NEGATIVE mg/dL   Nitrite NEGATIVE NEGATIVE   Leukocytes,Ua SMALL (A) NEGATIVE   RBC / HPF 0-5 0 - 5 RBC/hpf   WBC, UA 0-5 0 - 5 WBC/hpf   Bacteria, UA RARE (A) NONE SEEN   Squamous Epithelial / HPF 6-10 0 - 5 /HPF   Mucus PRESENT   hCG, serum, qualitative   Collection Time: 10/02/23  8:53 AM  Result Value Ref Range   Preg, Serum  POSITIVE (A) NEGATIVE  hCG, quantitative, pregnancy   Collection Time: 10/02/23  8:53 AM  Result Value Ref Range   hCG, Beta Chain, Quant, S 18,772 (H) <5 mIU/mL  POC Urine Pregnancy, ED (not at [facility] or [facility])   Collection Time: 10/02/23  9:00 AM  Result Value Ref Range   Preg Test, Ur POSITIVE (A) NEGATIVE    Assessment & Plan:  There are no diagnoses linked to this encounter.  No orders of the defined types were placed in this encounter.   PATIENT COUNSELING:  - Encouraged a  healthy well-balanced diet. Patient may adjust caloric intake to maintain or achieve ideal body weight. May reduce intake of dietary saturated fat and total fat and have adequate dietary potassium and calcium preferably from fresh fruits, vegetables, and low-fat dairy products.   - Advised to avoid cigarette smoking. - Discussed with the patient that most people either abstain from alcohol or drink within safe limits (<=14/week and <=4 drinks/occasion for males, <=7/weeks and <= 3 drinks/occasion for females) and that the risk for alcohol disorders and other health effects rises proportionally with the number of drinks per week and how often a drinker exceeds daily limits. - Discussed cessation/primary prevention of drug use and availability of treatment for abuse.  - Discussed sexually transmitted diseases, avoidance of unintended pregnancy and contraceptive alternatives.  - Stressed the importance of regular exercise - Injury prevention: Discussed safety belts, safety helmets, smoke detector, smoking near bedding or upholstery.  - Dental health: Discussed importance of regular tooth brushing, flossing, and dental visits.   NEXT PREVENTATIVE PHYSICAL DUE IN 1 YEAR.  No follow-ups on file.  Salvatore Decent, FNP

## 2023-12-08 ENCOUNTER — Encounter: Payer: Self-pay | Admitting: Radiology

## 2023-12-08 ENCOUNTER — Ambulatory Visit: Admitting: Radiology

## 2023-12-08 VITALS — BP 116/64 | HR 63 | Wt 166.2 lb

## 2023-12-08 DIAGNOSIS — R102 Pelvic and perineal pain: Secondary | ICD-10-CM

## 2023-12-08 DIAGNOSIS — Z113 Encounter for screening for infections with a predominantly sexual mode of transmission: Secondary | ICD-10-CM | POA: Diagnosis not present

## 2023-12-08 DIAGNOSIS — N898 Other specified noninflammatory disorders of vagina: Secondary | ICD-10-CM

## 2023-12-08 NOTE — Progress Notes (Signed)
      Subjective: Emily Callahan is a 28 y.o. female who complains of pelvic pain x 3 weeks with vaginal discharge. Started after her period. Using condoms for [facility]. Had TAB 10/16/23. Had a normal period 11/15/23.  Review of Systems  All other systems reviewed and are negative.   Past Medical History:  Diagnosis Date   Asthma    Breast pain, right 03/10/2023   Cellulitis of right breast 03/11/2023   IBS (irritable bowel syndrome)    Migraine    Pregnant    Sepsis (HCC) 03/10/2023      Objective:  Today's Vitals   12/08/23 1137  BP: 116/64  Pulse: 63  SpO2: 99%  Weight: 166 lb 3.2 oz (75.4 kg)   Body mass index is 28.53 kg/m.   Physical Exam Vitals and nursing note reviewed. Exam conducted with a chaperone present.  Constitutional:      Appearance: Normal appearance. She is well-developed.  Pulmonary:     Effort: Pulmonary effort is normal.  Abdominal:     General: Abdomen is flat.     Palpations: Abdomen is soft.  Genitourinary:    General: Normal vulva.     Vagina: Vaginal discharge present. No erythema, bleeding or lesions.     Cervix: Normal. No discharge, friability, lesion or erythema.     Uterus: Normal.      Adnexa: Right adnexa normal and left adnexa normal.  Neurological:     Mental Status: She is alert.  Psychiatric:        Mood and Affect: Mood normal.        Thought Content: Thought content normal.        Judgment: Judgment normal.     Raynelle Fanning, CMA present for exam  Urine dipstick shows positive for RBC's and positive for leukocytes.  Micro exam: 6-10 WBC's per HPF and moderate+ bacteria.   UPT: neg  Assessment:/Plan:   1. Pelvic pain (Primary) - Urinalysis,Complete w/RFL Culture - Pregnancy, urine - SureSwab Advanced Vaginitis Plus,TMA  2. Screen for STD (sexually transmitted disease) - SureSwab Advanced Vaginitis Plus,TMA  3. Vaginal discharge - SureSwab Advanced Vaginitis Plus,TMA    Will contact patient with results of  testing completed today. Avoid intercourse until symptoms are resolved. Safe sex encouraged. Avoid the use of soaps or perfumed products in the peri area. Avoid tub baths and sitting in sweaty or wet clothing for prolonged periods of time.   Return if symptoms worsen or fail to improve.   Alan Drummer B, NP 11:47 AM

## 2023-12-09 LAB — SURESWAB® ADVANCED VAGINITIS PLUS,TMA
C. trachomatis RNA, TMA: NOT DETECTED
CANDIDA SPECIES: NOT DETECTED
Candida glabrata: NOT DETECTED
N. gonorrhoeae RNA, TMA: NOT DETECTED
SURESWAB(R) ADV BACTERIAL VAGINOSIS(BV),TMA: POSITIVE — AB
TRICHOMONAS VAGINALIS (TV),TMA: NOT DETECTED

## 2023-12-10 DIAGNOSIS — B9689 Other specified bacterial agents as the cause of diseases classified elsewhere: Secondary | ICD-10-CM

## 2023-12-10 LAB — URINALYSIS, COMPLETE W/RFL CULTURE
Bilirubin Urine: NEGATIVE
Glucose, UA: NEGATIVE
Hyaline Cast: NONE SEEN /LPF
Ketones, ur: NEGATIVE
Nitrites, Initial: NEGATIVE
Protein, ur: NEGATIVE
Specific Gravity, Urine: 1.015 (ref 1.001–1.035)
pH: 6.5 (ref 5.0–8.0)

## 2023-12-10 LAB — URINE CULTURE
MICRO NUMBER:: 16279309
SPECIMEN QUALITY:: ADEQUATE

## 2023-12-10 LAB — PREGNANCY, URINE: Preg Test, Ur: NEGATIVE

## 2023-12-10 LAB — CULTURE INDICATED

## 2023-12-13 MED ORDER — NUVESSA 1.3 % VA GEL: 1.0000 | Freq: Once | VAGINAL | 0 refills | Status: AC

## 2024-01-05 ENCOUNTER — Ambulatory Visit: Admitting: Radiology

## 2024-01-24 NOTE — Progress Notes (Deleted)
  [facility]IMARY CARE [facility] [address] [place] [address] Dept: [phone] Dept Fax: [phone]  Acute Care Office Visit  Subjective:   Emily Callahan 03/30/1996 01/25/2024  No chief complaint on file.   HPI:     The following portions of the patient's history were reviewed and updated as appropriate: past medical history, past surgical history, family history, social history, allergies, medications, and problem list.   Patient Active Problem List   Diagnosis Date Noted   ASCUS of cervix with negative high risk HPV 09/09/2023   Asthma    Past Medical History:  Diagnosis Date   Asthma    Breast pain, right 03/10/2023   Cellulitis of right breast 03/11/2023   IBS (irritable bowel syndrome)    Migraine    Pregnant    Sepsis (HCC) 03/10/2023   Past Surgical History:  Procedure Laterality Date   MOUTH SURGERY     Wisdom teeth "pulled"    WISDOM TOOTH EXTRACTION     Family History  Problem Relation Age of Onset   Hypertension Mother    Cancer Mother    Stroke Mother    Heart attack Father 37    Current Outpatient Medications:    acetaminophen  (TYLENOL ) 500 MG tablet, Take 2 tablets (1,000 mg total) by mouth every 6 (six) hours as needed for moderate pain (pain score 4-6)., Disp: 30 tablet, Rfl: 0   meclizine  (ANTIVERT ) 25 MG tablet, Take 1 tablet (25 mg total) by mouth 3 (three) times daily as needed for dizziness or nausea. (Patient not taking: Reported on 12/08/2023), Disp: 30 tablet, Rfl: 0   NAPROXEN  PO, Take by mouth., Disp: , Rfl:    ondansetron  (ZOFRAN -ODT) 4 MG disintegrating tablet, 4mg  ODT q4 hours prn nausea/vomit, Disp: 10 tablet, Rfl: 2   pyridOXINE  (VITAMIN B6) 25 MG tablet, Take 1 tablet (25 mg total) by mouth every 8 (eight) hours as needed (For nausea and vomiting). (Patient not taking: Reported on 12/08/2023), Disp: 30 tablet, Rfl: 0   triamcinolone  cream (KENALOG ) 0.1 %, Apply 1 Application topically 2  (two) times daily., Disp: 45 g, Rfl: 1 No Known Allergies   ROS: A complete ROS was performed with pertinent positives/negatives noted in the HPI. The remainder of the ROS are negative.    Objective:   There were no vitals filed for this visit.  GENERAL: Well-appearing, in NAD. Well nourished.  SKIN: Pink, warm and dry. No rash, lesion, ulceration, or ecchymoses.  NECK: Trachea midline. Full ROM w/o pain or tenderness. No lymphadenopathy.  RESPIRATORY: Chest wall symmetrical. Respirations even and non-labored. Breath sounds clear to auscultation bilaterally.  CARDIAC: S1, S2 present, regular rate and rhythm. Peripheral pulses 2+ bilaterally.   EXTREMITIES: Without clubbing, cyanosis, or edema.  NEUROLOGIC: No motor or sensory deficits. Steady, even gait.  PSYCH/MENTAL STATUS: Alert, oriented x 3. Cooperative, appropriate mood and affect.    No results found for any visits on 01/25/24.    Assessment & Plan:   No orders of the defined types were placed in this encounter.  No orders of the defined types were placed in this encounter.  Lab Orders  No laboratory test(s) ordered today   No images are attached to the encounter or orders placed in the encounter.  No follow-ups on file.   Gavin Kast, FNP

## 2024-01-25 ENCOUNTER — Ambulatory Visit: Admitting: Internal Medicine

## 2024-01-25 NOTE — Progress Notes (Deleted)
  [facility]IMARY CARE [facility] [address] [place] [address] Dept: [phone] Dept Fax: [phone]  Acute Care Office Visit  Subjective:   Emily Callahan 03/30/1996 01/25/2024  No chief complaint on file.   HPI:     The following portions of the patient's history were reviewed and updated as appropriate: past medical history, past surgical history, family history, social history, allergies, medications, and problem list.   Patient Active Problem List   Diagnosis Date Noted   ASCUS of cervix with negative high risk HPV 09/09/2023   Asthma    Past Medical History:  Diagnosis Date   Asthma    Breast pain, right 03/10/2023   Cellulitis of right breast 03/11/2023   IBS (irritable bowel syndrome)    Migraine    Pregnant    Sepsis (HCC) 03/10/2023   Past Surgical History:  Procedure Laterality Date   MOUTH SURGERY     Wisdom teeth "pulled"    WISDOM TOOTH EXTRACTION     Family History  Problem Relation Age of Onset   Hypertension Mother    Cancer Mother    Stroke Mother    Heart attack Father 37    Current Outpatient Medications:    acetaminophen  (TYLENOL ) 500 MG tablet, Take 2 tablets (1,000 mg total) by mouth every 6 (six) hours as needed for moderate pain (pain score 4-6)., Disp: 30 tablet, Rfl: 0   meclizine  (ANTIVERT ) 25 MG tablet, Take 1 tablet (25 mg total) by mouth 3 (three) times daily as needed for dizziness or nausea. (Patient not taking: Reported on 12/08/2023), Disp: 30 tablet, Rfl: 0   NAPROXEN  PO, Take by mouth., Disp: , Rfl:    ondansetron  (ZOFRAN -ODT) 4 MG disintegrating tablet, 4mg  ODT q4 hours prn nausea/vomit, Disp: 10 tablet, Rfl: 2   pyridOXINE  (VITAMIN B6) 25 MG tablet, Take 1 tablet (25 mg total) by mouth every 8 (eight) hours as needed (For nausea and vomiting). (Patient not taking: Reported on 12/08/2023), Disp: 30 tablet, Rfl: 0   triamcinolone  cream (KENALOG ) 0.1 %, Apply 1 Application topically 2  (two) times daily., Disp: 45 g, Rfl: 1 No Known Allergies   ROS: A complete ROS was performed with pertinent positives/negatives noted in the HPI. The remainder of the ROS are negative.    Objective:   There were no vitals filed for this visit.  GENERAL: Well-appearing, in NAD. Well nourished.  SKIN: Pink, warm and dry. No rash, lesion, ulceration, or ecchymoses.  NECK: Trachea midline. Full ROM w/o pain or tenderness. No lymphadenopathy.  RESPIRATORY: Chest wall symmetrical. Respirations even and non-labored. Breath sounds clear to auscultation bilaterally.  CARDIAC: S1, S2 present, regular rate and rhythm. Peripheral pulses 2+ bilaterally.   EXTREMITIES: Without clubbing, cyanosis, or edema.  NEUROLOGIC: No motor or sensory deficits. Steady, even gait.  PSYCH/MENTAL STATUS: Alert, oriented x 3. Cooperative, appropriate mood and affect.    No results found for any visits on 01/25/24.    Assessment & Plan:   No orders of the defined types were placed in this encounter.  No orders of the defined types were placed in this encounter.  Lab Orders  No laboratory test(s) ordered today   No images are attached to the encounter or orders placed in the encounter.  No follow-ups on file.   Gavin Kast, FNP

## 2024-01-26 ENCOUNTER — Ambulatory Visit: Admitting: Internal Medicine

## 2024-01-27 ENCOUNTER — Ambulatory Visit: Admitting: Internal Medicine

## 2024-01-27 ENCOUNTER — Telehealth: Payer: Self-pay | Admitting: Internal Medicine

## 2024-01-27 NOTE — Telephone Encounter (Signed)
 01/25/2024 same day cancel via mychart 01/26/2024 same day cancel via mychart 01/27/2024 same day cancel via mychart  Final warning sent via mychart & blocked pt from scheduling through mychart.

## 2024-01-27 NOTE — Telephone Encounter (Signed)
 Provider aware

## 2024-01-28 ENCOUNTER — Ambulatory Visit: Admitting: Internal Medicine

## 2024-01-28 VITALS — BP 130/82 | HR 67 | Temp 98.2°F | Ht 64.0 in | Wt 166.0 lb

## 2024-01-28 DIAGNOSIS — G44209 Tension-type headache, unspecified, not intractable: Secondary | ICD-10-CM | POA: Diagnosis not present

## 2024-01-28 DIAGNOSIS — H538 Other visual disturbances: Secondary | ICD-10-CM | POA: Diagnosis not present

## 2024-01-28 MED ORDER — BUTALBITAL-APAP-CAFFEINE 50-325-40 MG PO TABS: 1.0000 | ORAL_TABLET | Freq: Four times a day (QID) | ORAL | 1 refills | Status: AC | PRN

## 2024-01-28 MED ORDER — KETOROLAC TROMETHAMINE 60 MG/2ML IM SOLN
60.0000 mg | Freq: Once | INTRAMUSCULAR | Status: AC
  Administered 2024-01-28: 60 mg via INTRAMUSCULAR
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
## 2024-02-28 ENCOUNTER — Emergency Department (HOSPITAL_COMMUNITY)

## 2024-02-28 ENCOUNTER — Other Ambulatory Visit: Payer: Self-pay

## 2024-02-28 ENCOUNTER — Encounter (HOSPITAL_COMMUNITY): Payer: Self-pay | Admitting: *Deleted

## 2024-02-28 ENCOUNTER — Emergency Department (HOSPITAL_COMMUNITY)
Admission: EM | Admit: 2024-02-28 | Discharge: 2024-02-28 | Disposition: A | Discharge status: Home / Self Care | Attending: Emergency Medicine | Admitting: Emergency Medicine

## 2024-02-28 DIAGNOSIS — W228XXA Striking against or struck by other objects, initial encounter: Secondary | ICD-10-CM | POA: Diagnosis not present

## 2024-02-28 DIAGNOSIS — R0781 Pleurodynia: Secondary | ICD-10-CM | POA: Insufficient documentation

## 2024-02-28 DIAGNOSIS — Y9289 Other specified places as the place of occurrence of the external cause: Secondary | ICD-10-CM | POA: Insufficient documentation

## 2024-02-28 DIAGNOSIS — M79675 Pain in left toe(s): Secondary | ICD-10-CM | POA: Insufficient documentation

## 2024-02-28 LAB — PREGNANCY, URINE: Preg Test, Ur: NEGATIVE

## 2024-02-28 MED ORDER — IBUPROFEN 800 MG PO TABS
800.0000 mg | ORAL_TABLET | Freq: Once | ORAL | Status: AC
  Administered 2024-02-28: 800 mg via ORAL
  Filled 2024-02-28: qty 1
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
## 2024-02-28 NOTE — Discharge Instructions (Addendum)
 Evaluation today of your right chest and left great toe was overall reassuring.  X-rays were negative for acute fracture or dislocation.  Recommend applying ice 3-4 times a day and taking ibuprofen  and Tylenol  as needed.  Please follow-up your PCP if your symptoms persist.  If you develop worsening chest pain shortness of breath please return to the ED for further evaluation.
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
## 2024-04-10 ENCOUNTER — Ambulatory Visit: Payer: Self-pay | Admitting: Emergency Medicine

## 2024-04-10 ENCOUNTER — Telehealth: Payer: Self-pay | Admitting: Internal Medicine

## 2024-04-10 NOTE — Telephone Encounter (Signed)
 Provider aware

## 2024-04-10 NOTE — Telephone Encounter (Signed)
 04/10/2024 no show/Stephanie Waddell, [place] 01/25/2024 same day cancel via mychart 01/26/2024 same day cancel via mychart 01/27/2024 same day cancel via mychart  Previously sent final warning - dismissal generated - sent via mail and mychart

## 2024-04-14 ENCOUNTER — Ambulatory Visit: Admitting: Internal Medicine

## 2024-04-20 ENCOUNTER — Ambulatory Visit: Payer: Self-pay | Admitting: Radiology

## 2024-04-26 ENCOUNTER — Ambulatory Visit: Payer: Self-pay | Admitting: Radiology

## 2024-04-26 ENCOUNTER — Encounter: Payer: Self-pay | Admitting: Radiology

## 2024-04-26 VITALS — BP 112/76 | HR 70 | Temp 98.2°F | Wt 162.0 lb

## 2024-04-26 DIAGNOSIS — R102 Pelvic and perineal pain: Secondary | ICD-10-CM | POA: Diagnosis not present

## 2024-04-26 DIAGNOSIS — N760 Acute vaginitis: Secondary | ICD-10-CM

## 2024-04-26 DIAGNOSIS — B9689 Other specified bacterial agents as the cause of diseases classified elsewhere: Secondary | ICD-10-CM | POA: Diagnosis not present

## 2024-04-26 MED ORDER — METRONIDAZOLE 500 MG PO TABS: 500.0000 mg | ORAL_TABLET | Freq: Two times a day (BID) | ORAL | 0 refills | Status: AC

## 2024-04-26 NOTE — Progress Notes (Signed)
      Subjective: Emily Callahan is a 28 y.o. female who complains of c/o lower central pelvic pain x 1 week. Worse when she eats or exercises. No new partners. No change in bowels. No urinary or vaginal symptoms.   Review of Systems  All other systems reviewed and are negative.   Past Medical History:  Diagnosis Date   Asthma    Breast pain, right 03/10/2023   Cellulitis of right breast 03/11/2023   IBS (irritable bowel syndrome)    Migraine    Pregnant    Sepsis (HCC) 03/10/2023      Objective:  Today's Vitals   04/26/24 1402  BP: 112/76  Pulse: 70  Temp: 98.2 F (36.8 C)  TempSrc: Oral  SpO2: 99%  Weight: 162 lb (73.5 kg)   Body mass index is 27.81 kg/m.   Physical Exam Vitals and nursing note reviewed. Exam conducted with a chaperone present.  Constitutional:      Appearance: Normal appearance. She is well-developed.  Pulmonary:     Effort: Pulmonary effort is normal.  Abdominal:     General: Abdomen is flat.     Palpations: Abdomen is soft.  Genitourinary:    General: Normal vulva.     Vagina: Vaginal discharge present. No erythema, bleeding or lesions.     Cervix: Normal. No discharge, friability, lesion or erythema.     Uterus: Normal.      Adnexa: Right adnexa normal and left adnexa normal.  Neurological:     Mental Status: She is alert.  Psychiatric:        Mood and Affect: Mood normal.        Thought Content: Thought content normal.        Judgment: Judgment normal.     Darice Hoit, CMA present for exam  Assessment:/Plan:   1. Pelvic pain (Primary) - Pregnancy, urine negative - Urinalysis,Complete w/RFL Culture  2. BV (bacterial vaginosis) Clue cells in urine samples - metroNIDAZOLE  (FLAGYL ) 500 MG tablet; Take 1 tablet (500 mg total) by mouth 2 (two) times daily.  Dispense: 14 tablet; Refill: 0    If pain persists after finishing antibiotic will schedule u/s.Avoid intercourse until symptoms are resolved. Safe sex encouraged. Avoid  the use of soaps or perfumed products in the peri area. Avoid tub baths and sitting in sweaty or wet clothing for prolonged periods of time.    Nevelyn Mellott B, NP 2:16 PM

## 2024-04-28 ENCOUNTER — Ambulatory Visit: Payer: Self-pay | Admitting: Radiology

## 2024-04-28 MED ORDER — NITROFURANTOIN MONOHYD MACRO 100 MG PO CAPS: 100.0000 mg | ORAL_CAPSULE | Freq: Two times a day (BID) | ORAL | 0 refills | Status: AC

## 2024-04-29 LAB — URINALYSIS, COMPLETE W/RFL CULTURE
Bilirubin Urine: NEGATIVE
Glucose, UA: NEGATIVE
Hgb urine dipstick: NEGATIVE
Hyaline Cast: NONE SEEN /LPF
Ketones, ur: NEGATIVE
Nitrites, Initial: NEGATIVE
Protein, ur: NEGATIVE
RBC / HPF: NONE SEEN /HPF (ref 0–2)
Specific Gravity, Urine: 1.02 (ref 1.001–1.035)
pH: 7.5 (ref 5.0–8.0)

## 2024-04-29 LAB — URINE CULTURE
MICRO NUMBER:: 16858448
SPECIMEN QUALITY:: ADEQUATE

## 2024-04-29 LAB — CULTURE INDICATED

## 2024-04-29 LAB — PREGNANCY, URINE: Preg Test, Ur: NEGATIVE

## 2024-05-15 ENCOUNTER — Other Ambulatory Visit: Payer: Self-pay

## 2024-05-15 ENCOUNTER — Emergency Department (HOSPITAL_COMMUNITY): Payer: Self-pay

## 2024-05-15 ENCOUNTER — Emergency Department (HOSPITAL_COMMUNITY)
Admission: EM | Admit: 2024-05-15 | Discharge: 2024-05-15 | Disposition: A | Discharge status: Home / Self Care | Payer: Self-pay | Attending: Emergency Medicine | Admitting: Emergency Medicine

## 2024-05-15 DIAGNOSIS — J45909 Unspecified asthma, uncomplicated: Secondary | ICD-10-CM | POA: Insufficient documentation

## 2024-05-15 DIAGNOSIS — R110 Nausea: Secondary | ICD-10-CM | POA: Insufficient documentation

## 2024-05-15 DIAGNOSIS — R059 Cough, unspecified: Secondary | ICD-10-CM | POA: Diagnosis present

## 2024-05-15 DIAGNOSIS — J020 Streptococcal pharyngitis: Secondary | ICD-10-CM | POA: Insufficient documentation

## 2024-05-15 LAB — GROUP A STREP BY PCR: Group A Strep by PCR: DETECTED — AB

## 2024-05-15 LAB — RESP PANEL BY RT-PCR (RSV, FLU A&B, COVID)  RVPGX2
Influenza A by PCR: NEGATIVE
Influenza B by PCR: NEGATIVE
Resp Syncytial Virus by PCR: NEGATIVE
SARS Coronavirus 2 by RT PCR: NEGATIVE

## 2024-05-15 MED ORDER — DEXAMETHASONE 4 MG PO TABS
10.0000 mg | ORAL_TABLET | Freq: Once | ORAL | Status: AC
  Administered 2024-05-15: 10 mg via ORAL
  Filled 2024-05-15: qty 1

## 2024-05-15 MED ORDER — AMOXICILLIN 500 MG PO CAPS
500.0000 mg | ORAL_CAPSULE | Freq: Once | ORAL | Status: AC
  Administered 2024-05-15: 500 mg via ORAL
  Filled 2024-05-15: qty 1

## 2024-05-15 MED ORDER — AMOXICILLIN 500 MG PO CAPS: 500.0000 mg | ORAL_CAPSULE | Freq: Two times a day (BID) | ORAL | 0 refills | Status: AC

## 2024-05-15 NOTE — ED Triage Notes (Signed)
 Patient c/o Flu like symptoms x 1 day. Patient report nausea, denies vomiting/ diarrhea. Patient denies fever.

## 2024-05-15 NOTE — ED Provider Notes (Signed)
 [place] EMERGENCY DEPARTMENT AT [facility] Provider Note   CSN: [id number] Arrival date & time: 05/15/24  1843     Patient presents with: Flu like symptoms   Emily Callahan is a 28 y.o. female.  Patient with past significant for asthma, migraines, IBS presents the emergency department concerns of flulike symptoms.  Endorses ongoing nausea, sore throat, and cough for the last day.  No sick contacts as far she is aware.  No measured temperatures or chills at home.  Reports that she was at work when she began to feel unwell with a nonproductive cough.  HPI     Prior to Admission medications   Medication Sig Start Date End Date Taking? Authorizing Provider  amoxicillin  (AMOXIL ) 500 MG capsule Take 1 capsule (500 mg total) by mouth 2 (two) times daily for 10 days. 05/15/24 05/25/24 Yes Nami Strawder A, PA-C  acetaminophen  (TYLENOL ) 500 MG tablet Take 2 tablets (1,000 mg total) by mouth every 6 (six) hours as needed for moderate pain (pain score 4-6). 09/08/23   Geiple, Joshua, PA-C  butalbital -acetaminophen -caffeine  (FIORICET) 50-325-40 MG tablet Take 1 tablet by mouth every 6 (six) hours as needed for headache. Patient not taking: Reported on 04/26/2024 01/28/24   Billy Knee, FNP  meclizine  (ANTIVERT ) 25 MG tablet Take 1 tablet (25 mg total) by mouth 3 (three) times daily as needed for dizziness or nausea. Patient not taking: Reported on 01/28/2024 05/30/23   Nivia Colon, PA-C  metroNIDAZOLE  (FLAGYL ) 500 MG tablet Take 1 tablet (500 mg total) by mouth 2 (two) times daily. 04/26/24   Chrzanowski, Jami B, NP  NAPROXEN  PO Take by mouth. Patient not taking: Reported on 01/28/2024    [provider]  nitrofurantoin , macrocrystal-monohydrate, (MACROBID ) 100 MG capsule Take 1 capsule (100 mg total) by mouth 2 (two) times daily. 04/28/24   Chrzanowski, Jami B, NP  ondansetron  (ZOFRAN -ODT) 4 MG disintegrating tablet 4mg  ODT q4 hours prn nausea/vomit Patient not taking: Reported on  01/28/2024 04/23/23   Harris, Abigail, PA-C  pyridOXINE  (VITAMIN B6) 25 MG tablet Take 1 tablet (25 mg total) by mouth every 8 (eight) hours as needed (For nausea and vomiting). Patient not taking: Reported on 01/28/2024 10/02/23   Franaszek, Amanda, PA-C  triamcinolone  cream (KENALOG ) 0.1 % Apply 1 Application topically 2 (two) times daily. 09/09/23   Billy Knee, FNP  loratadine  (CLARITIN ) 10 MG tablet Take 1 tablet (10 mg total) by mouth daily. Patient not taking: Reported on 09/07/2018 10/28/16 12/25/20  Rudy Carlin LABOR, MD    Allergies: Patient has no known allergies.    Review of Systems  HENT:  Positive for sore throat.   All other systems reviewed and are negative.   Updated Vital Signs BP 122/80 (BP Location: Left Arm)   Pulse 82   Temp 98.4 F (36.9 C) (Oral)   Resp 16   LMP 04/11/2024 (Exact Date)   SpO2 100%   Physical Exam Vitals and nursing note reviewed.  Constitutional:      General: She is not in acute distress.    Appearance: She is well-developed.  HENT:     Head: Normocephalic and atraumatic.     Comments: Oropharyngeal erythema present with no tonsillar exudate.  Uvula midline. Eyes:     Conjunctiva/sclera: Conjunctivae normal.  Cardiovascular:     Rate and Rhythm: Normal rate and regular rhythm.     Heart sounds: No murmur heard. Pulmonary:     Effort: Pulmonary effort is normal. No respiratory distress.  Breath sounds: Normal breath sounds.  Abdominal:     Palpations: Abdomen is soft.     Tenderness: There is no abdominal tenderness.  Musculoskeletal:        General: No swelling.     Cervical back: Neck supple.  Skin:    General: Skin is warm and dry.     Capillary Refill: Capillary refill takes less than 2 seconds.  Neurological:     Mental Status: She is alert.  Psychiatric:        Mood and Affect: Mood normal.     (all labs ordered are listed, but only abnormal results are displayed) Labs Reviewed  GROUP A STREP BY PCR - Abnormal;  Notable for the following components:      Result Value   Group A Strep by PCR DETECTED (*)    All other components within normal limits  RESP PANEL BY RT-PCR (RSV, FLU A&B, COVID)  [id number]    EKG: None  Radiology: DG Chest 1 View Result Date: 05/15/2024 CLINICAL DATA:  Flu like symptoms for 1 day, nausea, chest congestion EXAM: CHEST  1 VIEW COMPARISON:  02/28/2024 FINDINGS: The heart size and mediastinal contours are within normal limits. Both lungs are clear. The visualized skeletal structures are unremarkable. IMPRESSION: No active disease. Electronically Signed   By: Ozell Daring M.D.   On: 05/15/2024 19:37     Procedures   Medications Ordered in the ED  dexamethasone  (DECADRON ) tablet 10 mg (has no administration in time range)  amoxicillin  (AMOXIL ) capsule 500 mg (has no administration in time range)                                    Medical Decision Making Amount and/or Complexity of Data Reviewed Radiology: ordered.   This patient presents to the ED for concern of flulike symptoms.  Differential diagnosis includes strep pharyngitis, viral URI, bronchitis, pneumonia, COVID-19, influenza   Lab Tests:  I Ordered, and personally interpreted labs.  The pertinent results include: Respiratory panel negative, group A strep positive   Imaging Studies ordered:  I ordered imaging studies including chest x-ray I independently visualized and interpreted imaging which showed negative for any acute cardiopulmonary process I agree with the radiologist interpretation   Medicines ordered and prescription drug management:  I ordered medication including Decadron , amoxicillin  for pharyngitis Reevaluation of the patient after these medicines showed that the patient improved I have reviewed the patients home medicines and have made adjustments as needed   Problem List / ED Course:  Patient past history significant for asthma, migraines, IBS presents ED with concerns of  flulike symptoms.  Reports symptom onset earlier today while at work.  Endorses a nonproductive cough, slight throat irritation, and nausea but denies any vomiting or diarrhea.  No sick contacts as far she is aware. Physical exam shows some erythema of the oropharynx but no tonsillar exudate or uvular deviation.  Doubtful of PTA or RPA.  Suspect possible pharyngitis versus strep pharyngitis.  Abdomen is soft and nontender.  No normal heart or lung sounds. Respiratory panel negative.  Group A strep positive.  Chest x-ray negative. Initiated treatment with a one-time dose of Decadron  here in the emergency department.  First dose of amoxicillin  given prior to discharge.  Continuation of amoxicillin  sent to pharmacy for the next 10 days.  Return precautions discussed such as concerns for worsening symptoms.  Otherwise encouraged close follow-up with  primary care provider.   Social Determinants of Health:  None  Final diagnoses:  Strep pharyngitis    ED Discharge Orders          Ordered    amoxicillin  (AMOXIL ) 500 MG capsule  2 times daily        05/15/24 2017               Jabron Weese A, PA-C 05/15/24 2020    Freddi Hamilton, MD 05/18/24 [phone]

## 2024-05-15 NOTE — ED Notes (Signed)
 Patient transported to X-ray

## 2024-05-15 NOTE — Discharge Instructions (Addendum)
 You were seen in the emergency department today for concerns of flulike symptoms.  You tested positive for strep which is an infection in the throat.  You were negative for COVID-19, influenza, and RSV.  Your chest x-ray is also normal.  XR general course of antibiotics to treat this infection as well as given you a one-time dose of a steroid called Decadron  in the emergency department.  For any concerns or worsening symptoms, return to the emergency department.  Otherwise, please follow-up with your primary care provider.

## 2024-07-10 ENCOUNTER — Encounter: Payer: Self-pay | Admitting: Radiology

## 2024-09-18 ENCOUNTER — Encounter (HOSPITAL_COMMUNITY): Payer: Self-pay

## 2024-09-18 ENCOUNTER — Emergency Department (HOSPITAL_COMMUNITY)
Admission: EM | Admit: 2024-09-18 | Discharge: 2024-09-18 | Disposition: A | Discharge status: Home / Self Care | Attending: Emergency Medicine | Admitting: Emergency Medicine

## 2024-09-18 ENCOUNTER — Other Ambulatory Visit: Payer: Self-pay

## 2024-09-18 DIAGNOSIS — J45909 Unspecified asthma, uncomplicated: Secondary | ICD-10-CM | POA: Diagnosis not present

## 2024-09-18 DIAGNOSIS — R519 Headache, unspecified: Secondary | ICD-10-CM | POA: Diagnosis present

## 2024-09-18 LAB — COMPREHENSIVE METABOLIC PANEL WITH GFR
ALT: 14 U/L (ref 0–44)
AST: 18 U/L (ref 15–41)
Albumin: 4.2 g/dL (ref 3.5–5.0)
Alkaline Phosphatase: 49 U/L (ref 38–126)
Anion gap: 9 (ref 5–15)
BUN: 8 mg/dL (ref 6–20)
CO2: 28 mmol/L (ref 22–32)
Calcium: 9.2 mg/dL (ref 8.9–10.3)
Chloride: 105 mmol/L (ref 98–111)
Creatinine, Ser: 0.8 mg/dL (ref 0.44–1.00)
GFR, Estimated: >60 mL/min
Glucose, Bld: 108 mg/dL — ABNORMAL HIGH (ref 70–99)
Potassium: 4.2 mmol/L (ref 3.5–5.1)
Sodium: 142 mmol/L (ref 135–145)
Total Bilirubin: 0.3 mg/dL (ref 0.0–1.2)
Total Protein: 8.1 g/dL (ref 6.5–8.1)

## 2024-09-18 LAB — CBC
HCT: 38.7 % (ref 36.0–46.0)
Hemoglobin: 12 g/dL (ref 12.0–15.0)
MCH: 25.4 pg — ABNORMAL LOW (ref 26.0–34.0)
MCHC: 31 g/dL (ref 30.0–36.0)
MCV: 82 fL (ref 80.0–100.0)
Platelets: 303 K/uL (ref 150–400)
RBC: 4.72 MIL/uL (ref 3.87–5.11)
RDW: 16 % — ABNORMAL HIGH (ref 11.5–15.5)
WBC: 7.6 K/uL (ref 4.0–10.5)
nRBC: 0 % (ref 0.0–0.2)

## 2024-09-18 LAB — HCG, SERUM, QUALITATIVE: Preg, Serum: NEGATIVE

## 2024-09-18 MED ORDER — ONDANSETRON 4 MG PO TBDP
4.0000 mg | ORAL_TABLET | Freq: Once | ORAL | Status: AC | PRN
  Administered 2024-09-18: 4 mg via ORAL
  Filled 2024-09-18: qty 1

## 2024-09-18 MED ORDER — OXYCODONE-ACETAMINOPHEN 5-325 MG PO TABS
1.0000 | ORAL_TABLET | ORAL | Status: DC | PRN
  Administered 2024-09-18: 1 via ORAL
  Filled 2024-09-18: qty 1

## 2024-09-18 MED ORDER — DEXAMETHASONE SOD PHOSPHATE PF 10 MG/ML IJ SOLN
10.0000 mg | Freq: Once | INTRAMUSCULAR | Status: AC
  Administered 2024-09-18: 10 mg via INTRAMUSCULAR
  Filled 2024-09-18: qty 1

## 2024-09-18 MED ORDER — ONDANSETRON 4 MG PO TBDP: 4.0000 mg | ORAL_TABLET | Freq: Three times a day (TID) | ORAL | 0 refills | Status: AC | PRN

## 2024-09-18 MED ORDER — KETOROLAC TROMETHAMINE 60 MG/2ML IM SOLN
30.0000 mg | Freq: Once | INTRAMUSCULAR | Status: AC
  Administered 2024-09-18: 30 mg via INTRAMUSCULAR
  Filled 2024-09-18: qty 2

## 2024-09-18 NOTE — ED Provider Notes (Signed)
 " [place] EMERGENCY DEPARTMENT AT [facility] Provider Note   CSN: [id number] Arrival date & time: 09/18/24  0747     Patient presents with: Migraine   Emily Callahan is a 29 y.o. female.    Migraine Associated symptoms include headaches.  Patient presents for headache.  Medical history includes migraines, asthma, IBS.  This morning at around 2:30 AM, she had onset of a bifrontal headache.  She did have associated nausea and vomiting.  Her symptoms are consistent with prior migraines.  She took some Tylenol  at home.  She came to the ED this morning.  In triage, she was given Percocet and Zofran .  Since then, she has had resolution of her nausea and some improvement in her pain.  Current headache pain is 4/10 in severity.     Prior to Admission medications  Medication Sig Start Date End Date Taking? Authorizing Provider  ondansetron  (ZOFRAN -ODT) 4 MG disintegrating tablet Take 1 tablet (4 mg total) by mouth every 8 (eight) hours as needed. 09/18/24  Yes Melvenia Motto, MD  acetaminophen  (TYLENOL ) 500 MG tablet Take 2 tablets (1,000 mg total) by mouth every 6 (six) hours as needed for moderate pain (pain score 4-6). 09/08/23   Geiple, Joshua, PA-C  butalbital -acetaminophen -caffeine  (FIORICET) 50-325-40 MG tablet Take 1 tablet by mouth every 6 (six) hours as needed for headache. Patient not taking: Reported on 04/26/2024 01/28/24   Billy Knee, FNP  meclizine  (ANTIVERT ) 25 MG tablet Take 1 tablet (25 mg total) by mouth 3 (three) times daily as needed for dizziness or nausea. Patient not taking: Reported on 01/28/2024 05/30/23   Nivia Colon, PA-C  metroNIDAZOLE  (FLAGYL ) 500 MG tablet Take 1 tablet (500 mg total) by mouth 2 (two) times daily. 04/26/24   Chrzanowski, Jami B, NP  NAPROXEN  PO Take by mouth. Patient not taking: Reported on 01/28/2024    [provider]  nitrofurantoin , macrocrystal-monohydrate, (MACROBID ) 100 MG capsule Take 1 capsule (100 mg total) by mouth 2 (two)  times daily. 04/28/24   Chrzanowski, Jami B, NP  ondansetron  (ZOFRAN -ODT) 4 MG disintegrating tablet 4mg  ODT q4 hours prn nausea/vomit Patient not taking: Reported on 01/28/2024 04/23/23   Harris, Abigail, PA-C  pyridOXINE  (VITAMIN B6) 25 MG tablet Take 1 tablet (25 mg total) by mouth every 8 (eight) hours as needed (For nausea and vomiting). Patient not taking: Reported on 01/28/2024 10/02/23   Franaszek, Amanda, PA-C  triamcinolone  cream (KENALOG ) 0.1 % Apply 1 Application topically 2 (two) times daily. 09/09/23   Billy Knee, FNP  loratadine  (CLARITIN ) 10 MG tablet Take 1 tablet (10 mg total) by mouth daily. Patient not taking: Reported on 09/07/2018 10/28/16 12/25/20  Rudy Carlin LABOR, MD    Allergies: Patient has no known allergies.    Review of Systems  Gastrointestinal:  Positive for nausea and vomiting.  Neurological:  Positive for headaches.  All other systems reviewed and are negative.   Updated Vital Signs BP (!) 129/90 (BP Location: Right Arm)   Pulse 78   Temp 97.7 F (36.5 C) (Oral)   Resp 15   Ht 5' 4 (1.626 m)   Wt 72.6 kg   SpO2 100%   BMI 27.46 kg/m   Physical Exam Vitals and nursing note reviewed.  Constitutional:      General: She is not in acute distress.    Appearance: Normal appearance. She is well-developed. She is not ill-appearing, toxic-appearing or diaphoretic.  HENT:     Head: Normocephalic and atraumatic.  Right Ear: External ear normal.     Left Ear: External ear normal.     Nose: Nose normal.     Mouth/Throat:     Mouth: Mucous membranes are moist.  Eyes:     Extraocular Movements: Extraocular movements intact.     Conjunctiva/sclera: Conjunctivae normal.  Cardiovascular:     Rate and Rhythm: Normal rate and regular rhythm.  Pulmonary:     Effort: Pulmonary effort is normal. No respiratory distress.  Abdominal:     General: There is no distension.     Palpations: Abdomen is soft.     Tenderness: There is no abdominal tenderness.   Musculoskeletal:        General: No swelling. Normal range of motion.     Cervical back: Normal range of motion and neck supple.  Skin:    General: Skin is warm and dry.  Neurological:     General: No focal deficit present.     Mental Status: She is alert and oriented to person, place, and time.     Cranial Nerves: No cranial nerve deficit.     Sensory: No sensory deficit.     Motor: No weakness.     Coordination: Coordination normal.  Psychiatric:        Mood and Affect: Mood normal.        Behavior: Behavior normal.     (all labs ordered are listed, but only abnormal results are displayed) Labs Reviewed  COMPREHENSIVE METABOLIC PANEL WITH GFR - Abnormal; Notable for the following components:      Result Value   Glucose, Bld 108 (*)    All other components within normal limits  CBC - Abnormal; Notable for the following components:   MCH 25.4 (*)    RDW 16.0 (*)    All other components within normal limits  HCG, SERUM, QUALITATIVE    EKG: None  Radiology: No results found.   Procedures   Medications Ordered in the ED  oxyCODONE -acetaminophen  (PERCOCET/ROXICET) 5-325 MG per tablet 1 tablet (1 tablet Oral Given 09/18/24 0809)  ketorolac  (TORADOL ) injection 30 mg (has no administration in time range)  dexamethasone  (DECADRON ) injection 10 mg (has no administration in time range)  ondansetron  (ZOFRAN -ODT) disintegrating tablet 4 mg (4 mg Oral Given 09/18/24 0820)                                    Medical Decision Making Amount and/or Complexity of Data Reviewed Labs: ordered.  Risk Prescription drug management.   Patient presenting for bifrontal headache since this morning.  Symptoms are consistent with prior migraines.  On arrival in the ED, vital signs are normal.  In triage, she was treated with Percocet and Zofran .  On my initial assessment, patient is resting comfortably.  She states that her headache has improved and now is 4/10 in severity.  Her nausea  has resolved.  Her lab work shows normal kidney function, normal electrolytes, no leukocytosis.  Toradol  and Decadron  were ordered for ongoing management of her headache.  Patient does not wish to stay for any further treatment or observation.  Given patient's report of recurring migraines, will advised to follow-up with neurology.  Patient was discharged in stable condition.     Final diagnoses:  Bad headache    ED Discharge Orders          Ordered    ondansetron  (ZOFRAN -ODT) 4 MG disintegrating tablet  Every 8 hours PRN        09/18/24 1224               Melvenia Motto, MD 09/18/24 1224  "

## 2024-09-18 NOTE — Discharge Instructions (Addendum)
 Take ibuprofen  and Tylenol  at home as needed for headaches.  A refill prescription for Zofran  was sent to your pharmacy.  Take this as needed for nausea.  Drink plenty of fluids to stay hydrated.  There is a telephone number below that you should call to follow-up with a neurologist.  These are specialists who can manage chronic recurrence of migraine headaches.  Return to the emergency department for any new or worsening symptoms of concern.

## 2024-09-18 NOTE — ED Triage Notes (Signed)
 Pt came invia POV d/t a migraine since 0230 this AM & after taking Tylenol  it has not helped & she has been vomiting & her head is pounding, does have Hx of migraines. A/Ox4,rates her pain 9/10 during triage.
